# Patient Record
Sex: Male | Born: 1950 | Race: White | Hispanic: No | Marital: Married | Smoking: Former smoker
Health system: Southern US, Community
[De-identification: ages and names within clinical notes are randomized; demographics above are authoritative.]

## PROBLEM LIST (undated history)

## (undated) DIAGNOSIS — Z8489 Family history of other specified conditions: Secondary | ICD-10-CM

## (undated) DIAGNOSIS — J189 Pneumonia, unspecified organism: Secondary | ICD-10-CM

## (undated) DIAGNOSIS — M1712 Unilateral primary osteoarthritis, left knee: Secondary | ICD-10-CM

## (undated) DIAGNOSIS — M1990 Unspecified osteoarthritis, unspecified site: Secondary | ICD-10-CM

## (undated) DIAGNOSIS — G4730 Sleep apnea, unspecified: Secondary | ICD-10-CM

## (undated) DIAGNOSIS — I10 Essential (primary) hypertension: Secondary | ICD-10-CM

## (undated) DIAGNOSIS — K219 Gastro-esophageal reflux disease without esophagitis: Secondary | ICD-10-CM

## (undated) DIAGNOSIS — D751 Secondary polycythemia: Secondary | ICD-10-CM

## (undated) DIAGNOSIS — J45909 Unspecified asthma, uncomplicated: Secondary | ICD-10-CM

## (undated) DIAGNOSIS — R3915 Urgency of urination: Secondary | ICD-10-CM

## (undated) DIAGNOSIS — M1711 Unilateral primary osteoarthritis, right knee: Secondary | ICD-10-CM

## (undated) DIAGNOSIS — N529 Male erectile dysfunction, unspecified: Secondary | ICD-10-CM

## (undated) DIAGNOSIS — E785 Hyperlipidemia, unspecified: Secondary | ICD-10-CM

## (undated) DIAGNOSIS — R079 Chest pain, unspecified: Secondary | ICD-10-CM

## (undated) HISTORY — PX: COLONOSCOPY W/ POLYPECTOMY: SHX1380

## (undated) HISTORY — PX: SKIN GRAFT: SHX250

## (undated) HISTORY — PX: HERNIA REPAIR: SHX51

## (undated) HISTORY — PX: KNEE SURGERY: SHX244

## (undated) HISTORY — PX: OTHER SURGICAL HISTORY: SHX169

## (undated) HISTORY — PX: SKIN SPLIT GRAFT: SHX444

## (undated) HISTORY — PX: APPENDECTOMY: SHX54

---
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
## 1970-08-24 HISTORY — PX: SKIN GRAFT: SHX250

## 2004-07-14 ENCOUNTER — Other Ambulatory Visit: Payer: Self-pay

## 2004-07-28 ENCOUNTER — Ambulatory Visit: Payer: Self-pay | Admitting: Orthopaedic Surgery

## 2005-08-24 HISTORY — PX: COLONOSCOPY: SHX174

## 2005-08-29 ENCOUNTER — Emergency Department: Payer: Self-pay | Admitting: Emergency Medicine

## 2005-09-29 ENCOUNTER — Ambulatory Visit: Payer: Self-pay | Admitting: Family Medicine

## 2005-10-15 ENCOUNTER — Ambulatory Visit: Payer: Self-pay | Admitting: General Surgery

## 2008-12-06 ENCOUNTER — Ambulatory Visit: Payer: Self-pay | Admitting: Otolaryngology

## 2008-12-17 ENCOUNTER — Ambulatory Visit: Payer: Self-pay | Admitting: Otolaryngology

## 2010-01-03 ENCOUNTER — Observation Stay: Payer: Self-pay | Admitting: Student

## 2012-05-13 ENCOUNTER — Emergency Department: Payer: Self-pay | Admitting: Emergency Medicine

## 2012-05-13 LAB — CBC WITH DIFFERENTIAL/PLATELET
Basophil #: 0 10*3/uL (ref 0.0–0.1)
Basophil %: 1.1 %
Eosinophil %: 0.2 %
HCT: 47 % (ref 40.0–52.0)
Lymphocyte #: 1.2 10*3/uL (ref 1.0–3.6)
MCHC: 34.3 g/dL (ref 32.0–36.0)
MCV: 97 fL (ref 80–100)
Monocyte #: 0.4 x10 3/mm (ref 0.2–1.0)
Monocyte %: 11.1 %
Neutrophil %: 54.7 %
RDW: 12.7 % (ref 11.5–14.5)

## 2012-05-13 LAB — MONONUCLEOSIS SCREEN: Mono Test: NEGATIVE

## 2012-05-13 LAB — COMPREHENSIVE METABOLIC PANEL
Albumin: 3.8 g/dL (ref 3.4–5.0)
Alkaline Phosphatase: 63 U/L (ref 50–136)
Bilirubin,Total: 0.3 mg/dL (ref 0.2–1.0)
Chloride: 101 mmol/L (ref 98–107)
Co2: 28 mmol/L (ref 21–32)
Creatinine: 1.18 mg/dL (ref 0.60–1.30)
EGFR (African American): >60
Glucose: 107 mg/dL — ABNORMAL HIGH (ref 65–99)
Osmolality: 278 (ref 275–301)
SGOT(AST): 52 U/L — ABNORMAL HIGH (ref 15–37)
SGPT (ALT): 70 U/L (ref 12–78)
Sodium: 138 mmol/L (ref 136–145)
Total Protein: 6.9 g/dL (ref 6.4–8.2)

## 2012-05-13 LAB — TROPONIN I: Troponin-I: <0.02 ng/mL

## 2012-05-13 LAB — LIPASE, BLOOD: Lipase: 103 U/L (ref 73–393)

## 2013-10-11 ENCOUNTER — Encounter (HOSPITAL_COMMUNITY): Payer: Self-pay | Admitting: Emergency Medicine

## 2013-10-11 ENCOUNTER — Emergency Department (HOSPITAL_COMMUNITY)
Admission: EM | Admit: 2013-10-11 | Discharge: 2013-10-12 | Disposition: A | Discharge status: Home / Self Care | Payer: Managed Care, Other (non HMO) | Attending: Emergency Medicine | Admitting: Emergency Medicine

## 2013-10-11 ENCOUNTER — Emergency Department (HOSPITAL_COMMUNITY): Payer: Managed Care, Other (non HMO)

## 2013-10-11 DIAGNOSIS — Z87891 Personal history of nicotine dependence: Secondary | ICD-10-CM | POA: Insufficient documentation

## 2013-10-11 DIAGNOSIS — R0602 Shortness of breath: Secondary | ICD-10-CM | POA: Insufficient documentation

## 2013-10-11 DIAGNOSIS — R079 Chest pain, unspecified: Secondary | ICD-10-CM | POA: Insufficient documentation

## 2013-10-11 DIAGNOSIS — Z8739 Personal history of other diseases of the musculoskeletal system and connective tissue: Secondary | ICD-10-CM | POA: Insufficient documentation

## 2013-10-11 HISTORY — DX: Unspecified osteoarthritis, unspecified site: M19.90

## 2013-10-11 LAB — BASIC METABOLIC PANEL
BUN: 18 mg/dL (ref 6–23)
CALCIUM: 9.1 mg/dL (ref 8.4–10.5)
CO2: 24 mEq/L (ref 19–32)
CREATININE: 1.12 mg/dL (ref 0.50–1.35)
Chloride: 101 mEq/L (ref 96–112)
GFR, EST AFRICAN AMERICAN: 78 mL/min — AB (ref >90)
GFR, EST NON AFRICAN AMERICAN: 68 mL/min — AB (ref >90)
GLUCOSE: 106 mg/dL — AB (ref 70–99)
POTASSIUM: 4.5 meq/L (ref 3.7–5.3)
Sodium: 140 mEq/L (ref 137–147)

## 2013-10-11 LAB — CBC
HCT: 43.2 % (ref 39.0–52.0)
Hemoglobin: 15.8 g/dL (ref 13.0–17.0)
MCH: 34.9 pg — ABNORMAL HIGH (ref 26.0–34.0)
MCHC: 36.6 g/dL — AB (ref 30.0–36.0)
MCV: 95.4 fL (ref 78.0–100.0)
Platelets: 155 10*3/uL (ref 150–400)
RBC: 4.53 MIL/uL (ref 4.22–5.81)
RDW: 12.7 % (ref 11.5–15.5)
WBC: 8.3 10*3/uL (ref 4.0–10.5)

## 2013-10-11 LAB — POCT I-STAT TROPONIN I: Troponin i, poc: 0 ng/mL (ref 0.00–0.08)

## 2013-10-11 LAB — PRO B NATRIURETIC PEPTIDE: Pro B Natriuretic peptide (BNP): 30.9 pg/mL (ref 0–125)

## 2013-10-11 MED ORDER — NITROGLYCERIN 0.4 MG SL SUBL
0.4000 mg | SUBLINGUAL_TABLET | SUBLINGUAL | Status: DC | PRN
  Filled 2013-10-11: qty 25

## 2013-10-11 MED ORDER — ASPIRIN 81 MG PO CHEW
324.0000 mg | CHEWABLE_TABLET | Freq: Once | ORAL | Status: AC
  Administered 2013-10-11: 324 mg via ORAL
  Filled 2013-10-11: qty 4

## 2013-10-11 NOTE — ED Notes (Signed)
Pt states allergy to acromycin

## 2013-10-11 NOTE — ED Notes (Signed)
Pt ambulatory to restroom

## 2013-10-11 NOTE — ED Notes (Signed)
Pt states for the last few days has had left sided off and on chest pain. States he felt dizzy walking in to the room and feels a little nauseous. Pt denies SOB, states he sleeps with cipap at night. Pt states he took his bp at home and it was 212/105. Pt in NAD, AAOx4. C/o 2/10 left sided CP. States hes had a mild headache. Denies any vision changes.

## 2013-10-11 NOTE — ED Provider Notes (Signed)
CSN: [id number]     Arrival date & time 10/11/13  1930 History   First MD Initiated Contact with Patient 10/11/13 2037     Chief Complaint  Patient presents with  . Chest Pain     (Consider location/radiation/quality/duration/timing/severity/associated sxs/prior Treatment) Patient is a 63 y.o. male presenting with chest pain.  Chest Pain Pain location:  L chest Pain quality: pressure   Pain radiates to:  Does not radiate Pain severity:  Mild Onset quality:  Gradual Duration: several hours. Timing:  Constant Progression:  Resolved Chronicity:  New Context: at rest   Relieved by:  Nothing Worsened by:  Nothing tried Associated symptoms: shortness of breath   Associated symptoms: no abdominal pain, no cough, no diaphoresis, no dizziness, no fever, no nausea and not vomiting     Past Medical History  Diagnosis Date  . Arthritis    Past Surgical History  Procedure Laterality Date  . Hernia repair    . Appendectomy    . Hydrocelectomy    . Skin split graft    . Knee surgery     No family history on file. History  Substance Use Topics  . Smoking status: Former Research scientist (life sciences)  . Smokeless tobacco: Never Used  . Alcohol Use: No    Review of Systems  Constitutional: Negative for fever and diaphoresis.  HENT: Negative for congestion.   Respiratory: Positive for shortness of breath. Negative for cough.   Cardiovascular: Positive for chest pain.  Gastrointestinal: Negative for nausea, vomiting, abdominal pain and diarrhea.  Neurological: Negative for dizziness.  All other systems reviewed and are negative.      Allergies  Review of patient's allergies indicates no known allergies.  Home Medications   Current Outpatient Rx  Name  Route  Sig  Dispense  Refill  . albuterol (PROVENTIL HFA;VENTOLIN HFA) 108 (90 BASE) MCG/ACT inhaler   Inhalation   Inhale 2 puffs into the lungs every 6 (six) hours as needed for wheezing or shortness of breath.         . fluticasone  (FLOVENT HFA) 110 MCG/ACT inhaler   Inhalation   Inhale 2 puffs into the lungs 2 (two) times daily as needed (wheezing and shortness of breath).         . Multiple Vitamins-Minerals (MULTIVITAMIN WITH MINERALS) tablet   Oral   Take 1 tablet by mouth daily. Packet of varies vitamins          BP 154/70  Pulse 58  Temp(Src) 97.9 F (36.6 C) (Oral)  Resp 18  Ht 5\' 10"  (1.778 m)  Wt 250 lb (113.399 kg)  BMI 35.87 kg/m2  SpO2 98% Physical Exam  Nursing note and vitals reviewed. Constitutional: He is oriented to person, place, and time. He appears well-developed and well-nourished. No distress.  HENT:  Head: Normocephalic and atraumatic.  Mouth/Throat: Oropharynx is clear and moist.  Eyes: Conjunctivae are normal. Pupils are equal, round, and reactive to light. No scleral icterus.  Neck: Neck supple.  Cardiovascular: Normal rate, regular rhythm, normal heart sounds and intact distal pulses.   No murmur heard. Pulmonary/Chest: Effort normal and breath sounds normal. No stridor. No respiratory distress. He has no wheezes. He has no rales.  Abdominal: Soft. He exhibits no distension. There is no tenderness.  Musculoskeletal: Normal range of motion. He exhibits no edema.  Neurological: He is alert and oriented to person, place, and time.  Skin: Skin is warm and dry. No rash noted.  Psychiatric: He has a normal mood and  affect. His behavior is normal.    ED Course  Procedures (including critical care time) Labs Review Labs Reviewed  CBC - Abnormal; Notable for the following:    MCH 34.9 (*)    MCHC 36.6 (*)    All other components within normal limits  BASIC METABOLIC PANEL - Abnormal; Notable for the following:    Glucose, Bld 106 (*)    GFR calc non Af Amer 68 (*)    GFR calc Af Amer 78 (*)    All other components within normal limits  PRO B NATRIURETIC PEPTIDE  TROPONIN I  POCT I-STAT TROPONIN I   Imaging Review Dg Chest 2 View  10/11/2013   CLINICAL DATA:   Hypertension, left chest pain  EXAM: CHEST - 2 VIEW  COMPARISON:  None available  FINDINGS: Lungs are clear. Heart size and mediastinal contours are within normal limits. Elevation of right diaphragmatic leaflet versus diaphragmatic eventration. No effusion.  No pneumothorax. Visualized skeletal structures are unremarkable.  IMPRESSION: No acute cardiopulmonary disease.   Electronically Signed   By: Arne Cleveland M.D.   On: 10/11/2013 20:10  All radiology studies independently viewed by me.     EKG Interpretation   None     EKG - NSR, rate 62, leftward axis, normal intervals, no ST/T changes, no priors for comparison.  MDM   Final diagnoses:  Chest pain    63 yo male with chest pain.  He reports some mild chest "annoyance" intermittently for past few days, particularly when he laid down for sleep.  Then today, he began to have some lightheadedness.  He checked his BP and it was elevated.  During this time, he developed some chest pain and mild SOB.  No pain now.  His workup so far is reassuring.  No hx of HTN, DM, HLD, not a smoker.  No hx of CAD.  Negative stress two years ago.  Plan to check delta troponin, but have low suspicion for ACS.  History not consistent with Dissection or PE.    Delta troponin negative.  Plan dc home with cards follow up.  Return precautions given.    Houston Siren III, MD 10/12/13 [phone]

## 2013-10-11 NOTE — ED Notes (Signed)
Pt c/o high blood pressure, intermittent left sided CP x 3 days with SOB when laying down.

## 2013-10-11 NOTE — ED Notes (Signed)
Pt denies CP at this time 

## 2013-10-12 LAB — TROPONIN I: Troponin I: <0.3 ng/mL (ref <0.30)

## 2013-10-12 NOTE — Discharge Instructions (Signed)
Chest Pain (Nonspecific) °It is often hard to give a specific diagnosis for the cause of chest pain. There is always a chance that your pain could be related to something serious, such as a heart attack or a blood clot in the lungs. You need to follow up with your caregiver for further evaluation. °CAUSES  °· Heartburn. °· Pneumonia or bronchitis. °· Anxiety or stress. °· Inflammation around your heart (pericarditis) or lung (pleuritis or pleurisy). °· A blood clot in the lung. °· A collapsed lung (pneumothorax). It can develop suddenly on its own (spontaneous pneumothorax) or from injury (trauma) to the chest. °· Shingles infection (herpes zoster virus). °The chest wall is composed of bones, muscles, and cartilage. Any of these can be the source of the pain. °· The bones can be bruised by injury. °· The muscles or cartilage can be strained by coughing or overwork. °· The cartilage can be affected by inflammation and become sore (costochondritis). °DIAGNOSIS  °Lab tests or other studies, such as X-rays, electrocardiography, stress testing, or cardiac imaging, may be needed to find the cause of your pain.  °TREATMENT  °· Treatment depends on what may be causing your chest pain. Treatment may include: °· Acid blockers for heartburn. °· Anti-inflammatory medicine. °· Pain medicine for inflammatory conditions. °· Antibiotics if an infection is present. °· You may be advised to change lifestyle habits. This includes stopping smoking and avoiding alcohol, caffeine, and chocolate. °· You may be advised to keep your head raised (elevated) when sleeping. This reduces the chance of acid going backward from your stomach into your esophagus. °· Most of the time, nonspecific chest pain will improve within 2 to 3 days with rest and mild pain medicine. °HOME CARE INSTRUCTIONS  °· If antibiotics were prescribed, take your antibiotics as directed. Finish them even if you start to feel better. °· For the next few days, avoid physical  activities that bring on chest pain. Continue physical activities as directed. °· Do not smoke. °· Avoid drinking alcohol. °· Only take over-the-counter or prescription medicine for pain, discomfort, or fever as directed by your caregiver. °· Follow your caregiver's suggestions for further testing if your chest pain does not go away. °· Keep any follow-up appointments you made. If you do not go to an appointment, you could develop lasting (chronic) problems with pain. If there is any problem keeping an appointment, you must call to reschedule. °SEEK MEDICAL CARE IF:  °· You think you are having problems from the medicine you are taking. Read your medicine instructions carefully. °· Your chest pain does not go away, even after treatment. °· You develop a rash with blisters on your chest. °SEEK IMMEDIATE MEDICAL CARE IF:  °· You have increased chest pain or pain that spreads to your arm, neck, jaw, back, or abdomen. °· You develop shortness of breath, an increasing cough, or you are coughing up blood. °· You have severe back or abdominal pain, feel nauseous, or vomit. °· You develop severe weakness, fainting, or chills. °· You have a fever. °THIS IS AN EMERGENCY. Do not wait to see if the pain will go away. Get medical help at once. Call your local emergency services (911 in [place] Do not drive yourself to the hospital. °MAKE SURE YOU:  °· Understand these instructions. °· Will watch your condition. °· Will get help right away if you are not doing well or get worse. °Document Released: 05/20/2005 Document Revised: 11/02/2011 Document Reviewed: 03/15/2008 °Exit[employer]® Patient Information ©2014 [employer]   [employer]. ° °

## 2013-10-18 LAB — LIPID PANEL
Cholesterol: 250 mg/dL — AB (ref 0–200)
HDL: 38 mg/dL (ref 35–70)
LDL CALC: 160 mg/dL
LDL/HDL RATIO: 4.2
TRIGLYCERIDES: 259 mg/dL — AB (ref 40–160)

## 2013-10-18 LAB — BASIC METABOLIC PANEL
BUN: 17 mg/dL (ref 4–21)
Creatinine: 1 mg/dL (ref 0.6–1.3)
GLUCOSE: 101 mg/dL
SODIUM: 140 mmol/L (ref 137–147)

## 2013-10-18 LAB — HEPATIC FUNCTION PANEL
ALK PHOS: 52 U/L (ref 25–125)
ALT: 31 U/L (ref 10–40)
AST: 26 U/L (ref 14–40)
Bilirubin, Total: 0.5 mg/dL

## 2013-10-18 LAB — CBC AND DIFFERENTIAL: WBC: 7.3 10^3/mL

## 2013-10-18 LAB — PSA: PSA: 0.6

## 2013-12-05 ENCOUNTER — Ambulatory Visit (INDEPENDENT_AMBULATORY_CARE_PROVIDER_SITE_OTHER): Payer: Managed Care, Other (non HMO) | Admitting: Interventional Cardiology

## 2013-12-05 ENCOUNTER — Encounter: Payer: Self-pay | Admitting: Interventional Cardiology

## 2013-12-05 VITALS — BP 150/80 | HR 64 | Ht 70.0 in | Wt 248.0 lb

## 2013-12-05 DIAGNOSIS — G4730 Sleep apnea, unspecified: Secondary | ICD-10-CM | POA: Insufficient documentation

## 2013-12-05 DIAGNOSIS — J45909 Unspecified asthma, uncomplicated: Secondary | ICD-10-CM

## 2013-12-05 DIAGNOSIS — R079 Chest pain, unspecified: Secondary | ICD-10-CM

## 2013-12-05 DIAGNOSIS — K219 Gastro-esophageal reflux disease without esophagitis: Secondary | ICD-10-CM

## 2013-12-05 HISTORY — DX: Morbid (severe) obesity due to excess calories: E66.01

## 2013-12-05 HISTORY — DX: Chest pain, unspecified: R07.9

## 2013-12-05 NOTE — Patient Instructions (Signed)
Your physician recommends that you continue on your current medications as directed. Please refer to the Current Medication list given to you today.  Your physician has requested that you have a lexiscan myoview. For further information please visit [email or web address]. Please follow instruction sheet, as given.  Your physician recommends that you schedule a follow-up appointment pending results

## 2013-12-05 NOTE — Progress Notes (Signed)
Patient ID: Kenneth Hester, male   DOB: 17-May-1951, 63 y.o.   MRN: [id number]   Date: 12/05/2013 ID: Kenneth Hester, DOB October 31, 1950, MRN [id number] PCP: No PCP Per Patient  Reason: Chest discomfort  ASSESSMENT;  1. Chest discomfort 2. Obesity 3. Obstructive sleep apnea 4. History of asthma requiring minimal if any therapy for several years 5. History of inability to achieve target heart rate on exercise treadmill tests in the past, 2 prior occasions 6. History of reflux   PLAN:  1. Pharmacologic myocardial perfusion study 2. Baby aspirin 81 mg per day 3. Weight loss and aerobic exercise if myocardial perfusion study is unremarkable   SUBJECTIVE: Kenneth Hester is a 63 y.o. male who is the son of  1 of my coronary artery disease patient's, Mr. Kenneth Hester. He [facility] evaluation in [facility] in February 2015 because of precordial chest pressure. EKG performed suggested "possible septal infarct". It troponin levels were obtained and were normal. His story is that of intermittent episodes of substernal discomfort that of poorly characterized. On the day of presentation to the emergency room he described left chest pressure, gradual onset, constantly present for several hours. The discomfort resolved while in the emergency room. There was associated shortness of breath and mild diaphoresis.  He also complains of frequent heartburn characterized as a burning sensation in the left parasternal area. He has had various episodes of chest discomfort associated with cardiac evaluation including 2 prior stress test. The most recent study was about 4 years ago, but he tells me he could not get his heart rate to target, and the study had to be converted to a nuclear pharmacologic perfusion study. 2 prior exercise treadmill test were also nondiagnostic because of poor heart rate response. His episodes of chest discomfort are generally not exertion related.   No Known  Allergies  Current Outpatient Prescriptions on File Prior to Visit  Medication Sig Dispense Refill  . albuterol (PROVENTIL HFA;VENTOLIN HFA) 108 (90 BASE) MCG/ACT inhaler Inhale 2 puffs into the lungs every 6 (six) hours as needed for wheezing or shortness of breath.      . fluticasone (FLOVENT HFA) 110 MCG/ACT inhaler Inhale 2 puffs into the lungs 2 (two) times daily as needed (wheezing and shortness of breath).      . Multiple Vitamins-Minerals (MULTIVITAMIN WITH MINERALS) tablet Take 1 tablet by mouth daily. Packet of varies vitamins       No current facility-administered medications on file prior to visit.    Past Medical History  Diagnosis Date  . Arthritis     Past Surgical History  Procedure Laterality Date  . Hernia repair    . Appendectomy    . Hydrocelectomy    . Skin split graft    . Knee surgery      History   Social History  . Marital Status: Married    Spouse Name: N/A    Number of Children: N/A  . Years of Education: N/A   Occupational History  . Not on file.   Social History Main Topics  . Smoking status: Former Research scientist (life sciences)  . Smokeless tobacco: Never Used  . Alcohol Use: No  . Drug Use: No  . Sexual Activity: Yes   Other Topics Concern  . Not on file   Social History Narrative  . No narrative on file    No family history on file.  ROS: Severe bilateral osteoarthritis both knees preventing significant ambulation. Recurrent heartburn. Intolerance to statin therapy. Sleep apnea  on CPAP. Denies lower extremity edema. No orthopnea or PND.Marland Kitchen Other systems negative for complaints.  OBJECTIVE: BP 150/80  Pulse 64  Ht 5\' 10"  (1.778 m)  Wt 248 lb (112.492 kg)  BMI 35.58 kg/m2,  General: No acute distress, unremarkable  HEENT: normal no jaundice or pallor  Neck: JVD difficult to evaluate due to neck morphology. Carotids 2+ bilateral without bruits  Chest: Clear  Cardiac: Murmur: Absent. Gallop: S4. Rhythm:  regular. Other: Normal  Abdomen: Bruit:  Absent. Pulsation: Absent Extremities: Edema: Absent. Pulses: 2+ bilateral upper and lower extremities  Neuro: Normal  Psych: Normal  ECG: Sinus bradycardia with early QRS transition but otherwise unremarkable with exception of ST abnormality in limb lead 1, 2, and 3.

## 2013-12-20 ENCOUNTER — Ambulatory Visit (HOSPITAL_COMMUNITY): Payer: Managed Care, Other (non HMO) | Attending: Cardiovascular Disease | Admitting: Radiology

## 2013-12-20 VITALS — BP 140/84 | Ht 70.0 in | Wt 245.0 lb

## 2013-12-20 DIAGNOSIS — R0602 Shortness of breath: Secondary | ICD-10-CM

## 2013-12-20 DIAGNOSIS — R079 Chest pain, unspecified: Secondary | ICD-10-CM | POA: Insufficient documentation

## 2013-12-20 MED ORDER — TECHNETIUM TC 99M SESTAMIBI GENERIC - CARDIOLITE
30.0000 | Freq: Once | INTRAVENOUS | Status: AC | PRN
  Administered 2013-12-20: 30 via INTRAVENOUS

## 2013-12-20 MED ORDER — TECHNETIUM TC 99M SESTAMIBI GENERIC - CARDIOLITE
10.0000 | Freq: Once | INTRAVENOUS | Status: AC | PRN
  Administered 2013-12-20: 10 via INTRAVENOUS

## 2013-12-20 MED ORDER — REGADENOSON 0.4 MG/5ML IV SOLN
0.4000 mg | Freq: Once | INTRAVENOUS | Status: AC
  Administered 2013-12-20: 0.4 mg via INTRAVENOUS

## 2013-12-20 NOTE — Progress Notes (Signed)
[facility] [address] [place], [place] [address] [phone]    Cardiology Nuclear Med Study  Kenneth Hester is a 63 y.o. male     MRN : [id number]     DOB: 08/14/1951  Procedure Date: 12/20/2013  Nuclear Med Background Indication for Stress Test:  Evaluation for Ischemia History: No Known History of CAD;Previous  Nuclear Study (28yrs ago [facility]) Nml per pt; Asthma Cardiac Risk Factors: Strong Premature Family History - CAD, History of Smoking, Hypertension and Lipids  Symptoms:  Chest Pain, Dizziness and DOE   Nuclear Pre-Procedure Caffeine/Decaff Intake:  None > 12 hrs NPO After: 8:00pm   Lungs:  clear O2 Sat: 95% on room air. IV 0.9% NS with Angio Cath:  22g  IV Site: R Hand x 1, tolerated well IV Started by:  Irven Baltimore, RN  Chest Size (in):  50 Cup Size: n/a  Height: 5\' 10"  (1.778 m)  Weight:  245 lb (111.131 kg)  BMI:  Body mass index is 35.15 kg/(m^2). Tech Comments:  No medications this am per patient. Irven Baltimore, RN. Used rescue inhaler prior to test (albuterol)    Nuclear Med Study 1 or 2 day study: 1 day  Stress Test Type:  Treadmill/Lexiscan  Reading MD: N/A  Order Authorizing Provider:  Daneen Schick, III  Resting Radionuclide: Technetium 34m Sestamibi  Resting Radionuclide Dose: 11.0 mCi   Stress Radionuclide:  Technetium 51m Sestamibi  Stress Radionuclide Dose: 33.0 mCi           Stress Protocol Rest HR: 67 Stress HR: 93  Rest BP: 140/84 Stress BP: 162/79  Exercise Time (min): 2:00 METS: n/a   Predicted Max HR: 156 bpm % Max HR: 59.62 bpm Rate Pressure Product: 15066   Dose of Adenosine (mg):  n/a Dose of Lexiscan: 0.4 mg  Dose of Atropine (mg): n/a Dose of Dobutamine: n/a mcg/kg/min (at max HR)  Stress Test Technologist: Ileene Hutchinson, EMT-P  Nuclear Technologist:  Charlton Amor, CNMT     Rest Procedure:  Myocardial perfusion imaging was performed at rest 45 minutes following the intravenous  administration of Technetium 69m Sestamibi. Rest ECG: NSR - Normal EKG  Stress Procedure:  The patient received IV Lexiscan 0.4 mg over 15-seconds with concurrent low level exercise and then Technetium 82m Sestamibi was injected at 30-seconds while the patient continued walking one more minute.  Quantitative spect images were obtained after a 45-minute delay. Stress ECG: No significant change from baseline ECG  QPS Raw Data Images:  Normal; no motion artifact; normal heart/lung ratio. Stress Images:  Normal homogeneous uptake in all areas of the myocardium. Rest Images:  Normal homogeneous uptake in all areas of the myocardium. Subtraction (SDS):  No evidence of ischemia. Transient Ischemic Dilatation (Normal <1.22):  1.02 Lung/Heart Ratio (Normal <0.45):  0.43  Quantitative Gated Spect Images QGS EDV:  125 ml QGS ESV:  36 ml  Impression Exercise Capacity:  Lexiscan with no exercise. BP Response:  Normal blood pressure response. Clinical Symptoms:  No significant symptoms noted. ECG Impression:  No significant ST segment change suggestive of ischemia. Comparison with Prior Nuclear Study: No previous nuclear study performed  Overall Impression:  Normal stress nuclear study.  LV Ejection Fraction: 71%.  LV Wall Motion:  NL LV Function; NL Wall Motion  Sanda Klein, MD, [facility] [phone] office [phone] pager

## 2013-12-21 ENCOUNTER — Telehealth: Payer: Self-pay

## 2013-12-21 NOTE — Telephone Encounter (Signed)
Message copied by Lamar Laundry on Thu Dec 21, 2013  4:42 PM ------      Message from: Daneen Schick      Created: Thu Dec 21, 2013  4:31 PM       Stress test is normal, therefore no blockage in any major vessel greater than 70%.       Heart is strong. ------

## 2013-12-21 NOTE — Telephone Encounter (Signed)
pt aware of nuclear study.Stress test is normal, therefore no blockage in any major vessel greater than 70%.       Heart is strong. pt verbalized understanding.

## 2015-04-24 ENCOUNTER — Other Ambulatory Visit: Payer: Self-pay | Admitting: Orthopedic Surgery

## 2015-04-24 ENCOUNTER — Ambulatory Visit
Admission: RE | Admit: 2015-04-24 | Discharge: 2015-04-24 | Disposition: A | Discharge status: Home / Self Care | Payer: PPO | Source: Ambulatory Visit | Attending: Orthopedic Surgery | Admitting: Orthopedic Surgery

## 2015-04-24 DIAGNOSIS — M170 Bilateral primary osteoarthritis of knee: Secondary | ICD-10-CM | POA: Diagnosis not present

## 2015-04-24 DIAGNOSIS — M25561 Pain in right knee: Secondary | ICD-10-CM

## 2015-04-24 DIAGNOSIS — M1120 Other chondrocalcinosis, unspecified site: Secondary | ICD-10-CM | POA: Diagnosis not present

## 2015-04-24 DIAGNOSIS — M25562 Pain in left knee: Principal | ICD-10-CM

## 2015-07-13 ENCOUNTER — Other Ambulatory Visit: Payer: Self-pay | Admitting: Family Medicine

## 2015-07-17 ENCOUNTER — Other Ambulatory Visit: Payer: Self-pay | Admitting: Family Medicine

## 2015-07-17 ENCOUNTER — Other Ambulatory Visit: Payer: Self-pay

## 2015-07-17 MED ORDER — SILDENAFIL CITRATE 100 MG PO TABS: 100.0000 mg | ORAL_TABLET | Freq: Every day | ORAL | Status: DC

## 2015-07-19 ENCOUNTER — Other Ambulatory Visit: Payer: Self-pay | Admitting: Family Medicine

## 2015-08-06 ENCOUNTER — Encounter: Payer: Self-pay | Admitting: *Deleted

## 2015-08-13 ENCOUNTER — Other Ambulatory Visit: Payer: Self-pay | Admitting: Family Medicine

## 2015-08-19 ENCOUNTER — Other Ambulatory Visit: Payer: Self-pay | Admitting: Family Medicine

## 2015-08-28 DIAGNOSIS — M1711 Unilateral primary osteoarthritis, right knee: Secondary | ICD-10-CM | POA: Diagnosis not present

## 2015-09-04 ENCOUNTER — Encounter: Payer: Self-pay | Admitting: Family Medicine

## 2015-09-04 ENCOUNTER — Other Ambulatory Visit: Payer: Self-pay | Admitting: Family Medicine

## 2015-09-04 ENCOUNTER — Ambulatory Visit (INDEPENDENT_AMBULATORY_CARE_PROVIDER_SITE_OTHER): Payer: PPO | Admitting: Family Medicine

## 2015-09-04 VITALS — BP 154/82 | HR 72 | Temp 97.5°F | Resp 14 | Wt 256.0 lb

## 2015-09-04 DIAGNOSIS — Z01818 Encounter for other preprocedural examination: Secondary | ICD-10-CM

## 2015-09-04 DIAGNOSIS — M25561 Pain in right knee: Secondary | ICD-10-CM

## 2015-09-04 NOTE — Progress Notes (Signed)
Patient ID: Kenneth Hester, male   DOB: 05-May-1951, 65 y.o.   MRN: [id number]    Subjective:  HPI  Patient is here for surgical clearance. He is having Right total knee replacement scheduled for February 13th with Dr. Noemi Chapel.  Prior to Admission medications   Medication Sig Start Date End Date Taking? Authorizing Provider  FLOVENT HFA 110 MCG/ACT inhaler INHALE 2 PUFFS INTO THE LUNGS TWICE A DAY 08/14/15  Yes Richard Maceo Pro., MD  hydrochlorothiazide (HYDRODIURIL) 25 MG tablet Take by mouth. 07/27/14  Yes Historical Provider, MD  lisinopril (PRINIVIL,ZESTRIL) 20 MG tablet 1 tab daily 10/18/13  Yes Historical Provider, MD  Multiple Vitamins-Minerals (MULTIVITAMIN WITH MINERALS) tablet Take 1 tablet by mouth daily. Packet of varies vitamins   Yes Historical Provider, MD  omeprazole (PRILOSEC) 20 MG capsule Take by mouth. 07/27/14  Yes Historical Provider, MD  PROAIR HFA 108 (90 BASE) MCG/ACT inhaler INHALE 2 SPRAYS EVERY 4 HOURS AS NEEDED 08/20/15  Yes Richard Maceo Pro., MD  VIAGRA 100 MG tablet TAKE 1 TABLET BY MOUTH EVERY DAY 07/22/15  Yes Jerrol Banana., MD    Patient Active Problem List   Diagnosis Date Noted  . Chest pain 12/05/2013  . Sleep apnea 12/05/2013  . Morbid obesity ([place]) 12/05/2013  . GERD (gastroesophageal reflux disease) 12/05/2013  . Asthma 12/05/2013    Past Medical History  Diagnosis Date  . Arthritis     Social History   Social History  . Marital Status: Married    Spouse Name: N/A  . Number of Children: N/A  . Years of Education: N/A   Occupational History  . Not on file.   Social History Main Topics  . Smoking status: Former Research scientist (life sciences)  . Smokeless tobacco: Never Used  . Alcohol Use: No  . Drug Use: No  . Sexual Activity: Yes   Other Topics Concern  . Not on file   Social History Narrative    Allergies  Allergen Reactions  . Lisinopril     cough, voice was affected and had hard time singing  . Achromycin [Tetracycline]  Rash    Review of Systems  Constitutional: Negative.   Respiratory: Negative.   Cardiovascular: Negative.   Gastrointestinal: Negative.   Musculoskeletal: Positive for joint pain.    Immunization History  Administered Date(s) Administered  . Pneumococcal Conjugate-13 07/27/2014  . Tdap 07/27/2014   Objective:  BP 154/82 mmHg  Pulse 72  Temp(Src) 97.5 F (36.4 C)  Resp 14  Wt 256 lb (116.121 kg)  Physical Exam  Constitutional: He is oriented to person, place, and time and well-developed, well-nourished, and in no distress.  HENT:  Head: Normocephalic and atraumatic.  Eyes: Conjunctivae are normal. Pupils are equal, round, and reactive to light.  Neck: Normal range of motion. Neck supple.  Cardiovascular: Normal rate, regular rhythm, normal heart sounds and intact distal pulses.   No murmur heard. Pulmonary/Chest: Effort normal and breath sounds normal. No respiratory distress. He has no wheezes.  Musculoskeletal: He exhibits no edema.  Neurological: He is alert and oriented to person, place, and time.  Psychiatric: Mood, memory, affect and judgment normal.    Lab Results  Component Value Date   WBC 7.3 10/18/2013   HGB 15.8 10/11/2013   HCT 43.2 10/11/2013   PLT 155 10/11/2013   GLUCOSE 106* 10/11/2013   CHOL 250* 10/18/2013   TRIG 259* 10/18/2013   HDL 38 10/18/2013   LDLCALC 160 10/18/2013   PSA 0.6 10/18/2013  CMP     Component Value Date/Time   NA 140 10/18/2013   NA 140 10/11/2013 1940   NA 138 05/13/2012 1617   K 4.5 10/11/2013 1940   K 4.3 05/13/2012 1617   CL 101 10/11/2013 1940   CL 101 05/13/2012 1617   CO2 24 10/11/2013 1940   CO2 28 05/13/2012 1617   GLUCOSE 106* 10/11/2013 1940   GLUCOSE 107* 05/13/2012 1617   BUN 17 10/18/2013   BUN 18 10/11/2013 1940   BUN 17 05/13/2012 1617   CREATININE 1.0 10/18/2013   CREATININE 1.12 10/11/2013 1940   CREATININE 1.18 05/13/2012 1617   CALCIUM 9.1 10/11/2013 1940   CALCIUM 8.6 05/13/2012 1617     PROT 6.9 05/13/2012 1617   ALBUMIN 3.8 05/13/2012 1617   AST 26 10/18/2013   AST 52* 05/13/2012 1617   ALT 31 10/18/2013   ALT 70 05/13/2012 1617   ALKPHOS 52 10/18/2013   ALKPHOS 63 05/13/2012 1617   BILITOT 0.3 05/13/2012 1617   GFRNONAA 68* 10/11/2013 1940   GFRNONAA >60 05/13/2012 1617   GFRAA 78* 10/11/2013 1940   GFRAA >60 05/13/2012 1617    Assessment and Plan :  1. Preoperative clearance EKG stable today. Patient is cleared for surgery. Form faxed over to Dr. Noemi Chapel. - EKG 12-Lead Patient medically cleared for TKR. 2. Right knee pain/Endstage OA  I have done the exam and reviewed the above chart and it is accurate to the best of my knowledge.   Miguel Aschoff MD [facility] 09/04/2015 11:17 AM

## 2015-09-05 DIAGNOSIS — G4733 Obstructive sleep apnea (adult) (pediatric): Secondary | ICD-10-CM | POA: Diagnosis not present

## 2015-09-13 ENCOUNTER — Other Ambulatory Visit: Payer: Self-pay | Admitting: Family Medicine

## 2015-09-13 ENCOUNTER — Other Ambulatory Visit: Payer: Self-pay

## 2015-09-13 MED ORDER — HYDROCHLOROTHIAZIDE 25 MG PO TABS: 25.0000 mg | ORAL_TABLET | Freq: Every day | ORAL | Status: DC

## 2015-09-14 ENCOUNTER — Other Ambulatory Visit: Payer: Self-pay | Admitting: Family Medicine

## 2015-09-25 ENCOUNTER — Encounter (HOSPITAL_COMMUNITY): Payer: Self-pay | Admitting: Physician Assistant

## 2015-09-25 ENCOUNTER — Encounter (HOSPITAL_COMMUNITY): Admission: RE | Admit: 2015-09-25 | Payer: PPO | Source: Ambulatory Visit

## 2015-09-25 DIAGNOSIS — M1711 Unilateral primary osteoarthritis, right knee: Secondary | ICD-10-CM | POA: Diagnosis present

## 2015-09-25 DIAGNOSIS — I10 Essential (primary) hypertension: Secondary | ICD-10-CM | POA: Diagnosis present

## 2015-09-25 NOTE — H&P (Signed)
TOTAL KNEE ADMISSION H&P  Patient is being admitted for right total knee arthroplasty.  Subjective:  Chief Complaint:right knee pain.  HPI: Kenneth Hester, 65 y.o. male, has a history of pain and functional disability in the right knee due to arthritis and has failed non-surgical conservative treatments for greater than 12 weeks to includeNSAID's and/or analgesics, corticosteriod injections, viscosupplementation injections, flexibility and strengthening excercises, supervised PT with diminished ADL's post treatment, weight reduction as appropriate and activity modification.  Onset of symptoms was gradual, starting 10 years ago with gradually worsening course since that time. The patient noted prior procedures on the knee to include  arthroscopy and menisectomy on the right knee(s).  Patient currently rates pain in the right knee(s) at 10 out of 10 with activity. Patient has night pain, worsening of pain with activity and weight bearing, pain that interferes with activities of daily living, crepitus and joint swelling.  Patient has evidence of subchondral sclerosis, periarticular osteophytes and joint space narrowing by imaging studies. There is no active infection.  Patient Active Problem List   Diagnosis Date Noted  . Chest pain 12/05/2013  . Sleep apnea 12/05/2013  . Morbid obesity ([place]) 12/05/2013  . GERD (gastroesophageal reflux disease) 12/05/2013  . Asthma 12/05/2013   Past Medical History  Diagnosis Date  . Arthritis   . Primary localized osteoarthritis of right knee   . Hypertension   . Asthma     Past Surgical History  Procedure Laterality Date  . Hernia repair    . Appendectomy    . Hydrocelectomy    . Skin split graft    . Knee surgery Right   . Skin graft      No current facility-administered medications for this encounter.  Current outpatient prescriptions:  .  FLOVENT HFA 110 MCG/ACT inhaler, INHALE 2 PUFFS INTO THE LUNGS TWICE A DAY, Disp: 12 Inhaler, Rfl: 0 .   hydrochlorothiazide (HYDRODIURIL) 25 MG tablet, Take 1 tablet (25 mg total) by mouth daily., Disp: 90 tablet, Rfl: 3 .  Multiple Vitamins-Minerals (MULTIVITAMIN WITH MINERALS) tablet, Take 1 tablet by mouth daily. Packet of varies vitamins, Disp: , Rfl:  .  PROAIR HFA 108 (90 BASE) MCG/ACT inhaler, INHALE 2 SPRAYS EVERY 4 HOURS AS NEEDED, Disp: 8.5 Inhaler, Rfl: 12 .  VIAGRA 100 MG tablet, TAKE 1 TABLET BY MOUTH EVERY DAY, Disp: 10 tablet, Rfl: 12    Allergies  Allergen Reactions  . Lisinopril     cough, voice was affected and had hard time singing  . Achromycin [Tetracycline] Rash    Social History  Substance Use Topics  . Smoking status: Former Research scientist (life sciences)  . Smokeless tobacco: Never Used  . Alcohol Use: No    Family History  Problem Relation Age of Onset  . Heart attack Mother   . Heart failure Mother   . Diabetes Mother   . Hypertension Mother   . Heart disease Mother   . Heart attack Sister   . Heart disease Sister   . Diabetes Sister   . Heart disease Father   . Cancer Father   . Diabetes Son   . Heart attack Maternal Grandfather   . Heart disease Son      Review of Systems  Constitutional: Negative.   HENT: Negative.   Eyes: Negative.   Respiratory: Negative.   Cardiovascular: Negative.   Gastrointestinal: Negative.   Genitourinary: Negative.   Musculoskeletal: Positive for back pain and joint pain.  Skin: Negative.   Neurological: Negative.  Endo/Heme/Allergies: Negative.   Psychiatric/Behavioral: Negative.     Objective:  Physical Exam  Constitutional: He is oriented to person, place, and time. He appears well-developed and well-nourished.  HENT:  Head: Normocephalic and atraumatic.  Mouth/Throat: Oropharynx is clear and moist.  Eyes: Conjunctivae and EOM are normal. Pupils are equal, round, and reactive to light.  Neck: Neck supple.  Cardiovascular: Normal rate and regular rhythm.   Respiratory: Breath sounds normal.  GI: Soft. Bowel sounds are  normal.  Genitourinary:  Not pertinent to current symptomatology therefore not examined.  Musculoskeletal:  Examination of his right knee reveals pain medially and laterally.  1+ crepitation.  1+ synovitis.  Range of motion is from -5 to 125 degrees.  Knee is stable with diffuse pain and normal patella tracking.  Examination of his left knee reveals full range of motion without pain, swelling, weakness or instability. Vascular exam: Pulses are 2+ and symmetric.   Neurological: He is alert and oriented to person, place, and time.  Skin: Skin is warm and dry.  Psychiatric: He has a normal mood and affect. His behavior is normal.    Vital signs in last 24 hours:    Labs:   Estimated body mass index is 37.26 kg/(m^2) as calculated from the following:   Height as of 07/27/14: 5\' 8"  (1.727 m).   Weight as of 12/20/13: 111.131 kg (245 lb).   Imaging Review Plain radiographs demonstrate severe degenerative joint disease of the right knee(s). The overall alignment issignificant varus. The bone quality appears to be good for age and reported activity level.  Assessment/Plan:  End stage arthritis, right knee  Principal Problem:   Primary localized osteoarthritis of right knee Active Problems:   Sleep apnea   Morbid obesity (HCC)   GERD (gastroesophageal reflux disease)   Asthma   Hypertension  The patient history, physical examination, clinical judgment of the provider and imaging studies are consistent with end stage degenerative joint disease of the right knee(s) and total knee arthroplasty is deemed medically necessary. The treatment options including medical management, injection therapy arthroscopy and arthroplasty were discussed at length. The risks and benefits of total knee arthroplasty were presented and reviewed. The risks due to aseptic loosening, infection, stiffness, patella tracking problems, thromboembolic complications and other imponderables were discussed. The patient  acknowledged the explanation, agreed to proceed with the plan and consent was signed. Patient is being admitted for inpatient treatment for surgery, pain control, PT, OT, prophylactic antibiotics, VTE prophylaxis, progressive ambulation and ADL's and discharge planning. The patient is planning to be discharged home with home health services

## 2015-09-26 NOTE — Pre-Procedure Instructions (Signed)
Kenneth Hester  09/26/2015     Your procedure is scheduled on : Monday October 07, 2015 at 7:15 AM.  Report to [facility] Admitting at 5:30 AM.  Call this number if you have problems the morning of surgery: [phone]    Remember:  Do not eat food or drink liquids after midnight.  Take these medicines the morning of surgery with A SIP OF WATER : Flovent inhaler, Proair inhaler if needed   Stop taking any vitamins,herbal medications/supplements, NSAIDs, Ibuprofen, Advil, Motrin, Aleve, etc on Monday February 6th (1 week prior to surgery)   Bring CPAP mask the day of your surgery   Do not wear jewelry.  Do not wear lotions, powders, or cologne.    Men may shave face and neck.  Do not bring valuables to the hospital.  [facility] is not responsible for any belongings or valuables.  Contacts, dentures or bridgework may not be worn into surgery.  Leave your suitcase in the car.  After surgery it may be brought to your room.  For patients admitted to the hospital, discharge time will be determined by your treatment team.  Patients discharged the day of surgery will not be allowed to drive home.   Name and phone number of your driver:    Special instructions:  Shower using CHG soap the night before and the morning of your surgery  Please read over the following fact sheets that you were given. Pain Booklet, Coughing and Deep Breathing, Blood Transfusion Information, Total Joint Packet, MRSA Information and Surgical Site Infection Prevention

## 2015-09-27 ENCOUNTER — Encounter (HOSPITAL_COMMUNITY): Payer: Self-pay

## 2015-09-27 ENCOUNTER — Encounter (HOSPITAL_COMMUNITY)
Admission: RE | Admit: 2015-09-27 | Discharge: 2015-09-27 | Disposition: A | Discharge status: Home / Self Care | Payer: PPO | Source: Ambulatory Visit | Attending: Orthopedic Surgery | Admitting: Orthopedic Surgery

## 2015-09-27 DIAGNOSIS — M1711 Unilateral primary osteoarthritis, right knee: Secondary | ICD-10-CM | POA: Insufficient documentation

## 2015-09-27 DIAGNOSIS — Z0183 Encounter for blood typing: Secondary | ICD-10-CM | POA: Insufficient documentation

## 2015-09-27 DIAGNOSIS — Z01812 Encounter for preprocedural laboratory examination: Secondary | ICD-10-CM | POA: Diagnosis not present

## 2015-09-27 HISTORY — DX: Pneumonia, unspecified organism: J18.9

## 2015-09-27 HISTORY — DX: Gastro-esophageal reflux disease without esophagitis: K21.9

## 2015-09-27 HISTORY — DX: Urgency of urination: R39.15

## 2015-09-27 HISTORY — DX: Family history of other specified conditions: Z84.89

## 2015-09-27 LAB — COMPREHENSIVE METABOLIC PANEL
ALBUMIN: 3.7 g/dL (ref 3.5–5.0)
ALK PHOS: 44 U/L (ref 38–126)
ALT: 21 U/L (ref 17–63)
ANION GAP: 9 (ref 5–15)
AST: 24 U/L (ref 15–41)
BUN: 17 mg/dL (ref 6–20)
CALCIUM: 9.1 mg/dL (ref 8.9–10.3)
CHLORIDE: 107 mmol/L (ref 101–111)
CO2: 23 mmol/L (ref 22–32)
Creatinine, Ser: 1.12 mg/dL (ref 0.61–1.24)
GFR calc non Af Amer: >60 mL/min (ref >60)
GLUCOSE: 103 mg/dL — AB (ref 65–99)
Potassium: 4.2 mmol/L (ref 3.5–5.1)
SODIUM: 139 mmol/L (ref 135–145)
Total Bilirubin: 0.7 mg/dL (ref 0.3–1.2)
Total Protein: 6.2 g/dL — ABNORMAL LOW (ref 6.5–8.1)

## 2015-09-27 LAB — TYPE AND SCREEN
ABO/RH(D): A POS
Antibody Screen: NEGATIVE

## 2015-09-27 LAB — CBC WITH DIFFERENTIAL/PLATELET
BASOS ABS: 0 10*3/uL (ref 0.0–0.1)
BASOS PCT: 1 %
EOS ABS: 0.3 10*3/uL (ref 0.0–0.7)
EOS PCT: 4 %
HCT: 44.3 % (ref 39.0–52.0)
HEMOGLOBIN: 15.4 g/dL (ref 13.0–17.0)
Lymphocytes Relative: 38 %
Lymphs Abs: 2.9 10*3/uL (ref 0.7–4.0)
MCH: 33.4 pg (ref 26.0–34.0)
MCHC: 34.8 g/dL (ref 30.0–36.0)
MCV: 96.1 fL (ref 78.0–100.0)
Monocytes Absolute: 0.5 10*3/uL (ref 0.1–1.0)
Monocytes Relative: 7 %
NEUTROS PCT: 50 %
Neutro Abs: 3.9 10*3/uL (ref 1.7–7.7)
PLATELETS: 165 10*3/uL (ref 150–400)
RBC: 4.61 MIL/uL (ref 4.22–5.81)
RDW: 12.5 % (ref 11.5–15.5)
WBC: 7.6 10*3/uL (ref 4.0–10.5)

## 2015-09-27 LAB — SURGICAL PCR SCREEN
MRSA, PCR: NEGATIVE
STAPHYLOCOCCUS AUREUS: NEGATIVE

## 2015-09-27 LAB — PROTIME-INR
INR: 1.05 (ref 0.00–1.49)
Prothrombin Time: 13.9 seconds (ref 11.6–15.2)

## 2015-09-27 LAB — APTT: APTT: 28 s (ref 24–37)

## 2015-09-27 LAB — ABO/RH: ABO/RH(D): A POS

## 2015-09-27 NOTE — Progress Notes (Signed)
PCP is Richard L. Cranford Mon  Cardiologist is Daneen Schick. Patient stated he saw Dr. Tamala Julian because his blood pressure "jumped way high" and he had to have a stress test. Patient stated he has not seen Dr. Tamala Julian in a while. LOV was noted in EPIC on 11/25/13. Nurse inquired about patient having any chest pain or discomfort, and patient stated "I have some chest pain sometimes when I'm sitting....it's not severe.Marland KitchenMarland KitchenMarland KitchenI think it's just indigestion....it lasts a minute or so, then it goes away." Patient denied having any acute cardiac issues or discomfort.   Patient stated he last used his Proair inhaler on Saturday.

## 2015-09-28 LAB — URINE CULTURE

## 2015-10-04 MED ORDER — CHLORHEXIDINE GLUCONATE 4 % EX LIQD: 60.0000 mL | Freq: Once | CUTANEOUS | Status: DC

## 2015-10-04 MED ORDER — LACTATED RINGERS IV SOLN
INTRAVENOUS | Status: DC
  Administered 2015-10-07 (×2): via INTRAVENOUS

## 2015-10-04 MED ORDER — DEXTROSE 5 % IV SOLN
3.0000 g | INTRAVENOUS | Status: AC
  Administered 2015-10-07: 3 g via INTRAVENOUS
  Filled 2015-10-04: qty 3000

## 2015-10-04 MED ORDER — POVIDONE-IODINE 7.5 % EX SOLN
Freq: Once | CUTANEOUS | Status: DC
  Filled 2015-10-04: qty 118

## 2015-10-07 ENCOUNTER — Encounter (HOSPITAL_COMMUNITY): Payer: Self-pay | Admitting: *Deleted

## 2015-10-07 ENCOUNTER — Encounter (HOSPITAL_COMMUNITY)
Admission: RE | Disposition: A | Discharge status: Home Health | Payer: Self-pay | Source: Ambulatory Visit | Attending: Orthopedic Surgery

## 2015-10-07 ENCOUNTER — Inpatient Hospital Stay (HOSPITAL_COMMUNITY): Payer: PPO | Admitting: Anesthesiology

## 2015-10-07 ENCOUNTER — Inpatient Hospital Stay (HOSPITAL_COMMUNITY)
Admission: RE | Admit: 2015-10-07 | Discharge: 2015-10-08 | DRG: 470 | Disposition: A | Discharge status: Home Health | Payer: PPO | Source: Ambulatory Visit | Attending: Orthopedic Surgery | Admitting: Orthopedic Surgery

## 2015-10-07 DIAGNOSIS — Z6836 Body mass index (BMI) 36.0-36.9, adult: Secondary | ICD-10-CM | POA: Diagnosis not present

## 2015-10-07 DIAGNOSIS — M179 Osteoarthritis of knee, unspecified: Secondary | ICD-10-CM | POA: Diagnosis not present

## 2015-10-07 DIAGNOSIS — K219 Gastro-esophageal reflux disease without esophagitis: Secondary | ICD-10-CM | POA: Diagnosis not present

## 2015-10-07 DIAGNOSIS — J45909 Unspecified asthma, uncomplicated: Secondary | ICD-10-CM | POA: Diagnosis present

## 2015-10-07 DIAGNOSIS — M1711 Unilateral primary osteoarthritis, right knee: Principal | ICD-10-CM | POA: Diagnosis present

## 2015-10-07 DIAGNOSIS — G4730 Sleep apnea, unspecified: Secondary | ICD-10-CM | POA: Diagnosis not present

## 2015-10-07 DIAGNOSIS — Z79899 Other long term (current) drug therapy: Secondary | ICD-10-CM | POA: Diagnosis not present

## 2015-10-07 DIAGNOSIS — Z87891 Personal history of nicotine dependence: Secondary | ICD-10-CM | POA: Diagnosis not present

## 2015-10-07 DIAGNOSIS — I10 Essential (primary) hypertension: Secondary | ICD-10-CM | POA: Diagnosis not present

## 2015-10-07 DIAGNOSIS — G8918 Other acute postprocedural pain: Secondary | ICD-10-CM | POA: Diagnosis not present

## 2015-10-07 DIAGNOSIS — M25561 Pain in right knee: Secondary | ICD-10-CM | POA: Diagnosis not present

## 2015-10-07 HISTORY — DX: Essential (primary) hypertension: I10

## 2015-10-07 HISTORY — DX: Unilateral primary osteoarthritis, right knee: M17.11

## 2015-10-07 HISTORY — DX: Sleep apnea, unspecified: G47.30

## 2015-10-07 HISTORY — PX: TOTAL KNEE ARTHROPLASTY: SHX125

## 2015-10-07 HISTORY — DX: Unspecified asthma, uncomplicated: J45.909

## 2015-10-07 SURGERY — ARTHROPLASTY, KNEE, TOTAL
Anesthesia: Monitor Anesthesia Care | Site: Knee | Laterality: Right

## 2015-10-07 MED ORDER — OXYCODONE HCL 5 MG PO TABS
ORAL_TABLET | ORAL | Status: AC
  Administered 2015-10-07: 10 mg via ORAL
  Filled 2015-10-07: qty 2

## 2015-10-07 MED ORDER — ALUM & MAG HYDROXIDE-SIMETH 200-200-20 MG/5ML PO SUSP: 30.0000 mL | ORAL | Status: DC | PRN

## 2015-10-07 MED ORDER — PROPOFOL 10 MG/ML IV BOLUS
INTRAVENOUS | Status: AC
  Filled 2015-10-07: qty 20

## 2015-10-07 MED ORDER — CELECOXIB 200 MG PO CAPS
200.0000 mg | ORAL_CAPSULE | Freq: Two times a day (BID) | ORAL | Status: DC
  Administered 2015-10-07 – 2015-10-08 (×3): 200 mg via ORAL
  Filled 2015-10-07 (×3): qty 1

## 2015-10-07 MED ORDER — POTASSIUM CHLORIDE IN NACL 20-0.9 MEQ/L-% IV SOLN
INTRAVENOUS | Status: DC
  Administered 2015-10-07 (×2): via INTRAVENOUS
  Filled 2015-10-07 (×2): qty 1000

## 2015-10-07 MED ORDER — FENTANYL CITRATE (PF) 100 MCG/2ML IJ SOLN
25.0000 ug | INTRAMUSCULAR | Status: DC | PRN
  Administered 2015-10-07 (×2): 50 ug via INTRAVENOUS

## 2015-10-07 MED ORDER — BUPIVACAINE-EPINEPHRINE (PF) 0.25% -1:200000 IJ SOLN
INTRAMUSCULAR | Status: AC
  Filled 2015-10-07: qty 30

## 2015-10-07 MED ORDER — ACETAMINOPHEN 650 MG RE SUPP: 650.0000 mg | Freq: Four times a day (QID) | RECTAL | Status: DC | PRN

## 2015-10-07 MED ORDER — MIDAZOLAM HCL 5 MG/5ML IJ SOLN
INTRAMUSCULAR | Status: DC | PRN
  Administered 2015-10-07 (×2): 1 mg via INTRAVENOUS

## 2015-10-07 MED ORDER — DIPHENHYDRAMINE HCL 12.5 MG/5ML PO ELIX: 12.5000 mg | ORAL_SOLUTION | ORAL | Status: DC | PRN

## 2015-10-07 MED ORDER — PROMETHAZINE HCL 25 MG/ML IJ SOLN: 6.2500 mg | INTRAMUSCULAR | Status: DC | PRN

## 2015-10-07 MED ORDER — MIDAZOLAM HCL 2 MG/2ML IJ SOLN
INTRAMUSCULAR | Status: AC
  Filled 2015-10-07: qty 2

## 2015-10-07 MED ORDER — METOCLOPRAMIDE HCL 5 MG PO TABS: 5.0000 mg | ORAL_TABLET | Freq: Three times a day (TID) | ORAL | Status: DC | PRN

## 2015-10-07 MED ORDER — ASPIRIN EC 325 MG PO TBEC
325.0000 mg | DELAYED_RELEASE_TABLET | Freq: Every day | ORAL | Status: DC
  Administered 2015-10-08: 325 mg via ORAL
  Filled 2015-10-07: qty 1

## 2015-10-07 MED ORDER — BUDESONIDE 0.5 MG/2ML IN SUSP
0.5000 mg | Freq: Two times a day (BID) | RESPIRATORY_TRACT | Status: DC
  Administered 2015-10-07 – 2015-10-08 (×2): 0.5 mg via RESPIRATORY_TRACT
  Filled 2015-10-07 (×2): qty 2

## 2015-10-07 MED ORDER — DEXAMETHASONE SODIUM PHOSPHATE 4 MG/ML IJ SOLN
INTRAMUSCULAR | Status: DC | PRN
  Administered 2015-10-07: 10 mg via INTRAVENOUS

## 2015-10-07 MED ORDER — PHENOL 1.4 % MT LIQD: 1.0000 | OROMUCOSAL | Status: DC | PRN

## 2015-10-07 MED ORDER — OXYCODONE HCL 5 MG PO TABS
5.0000 mg | ORAL_TABLET | ORAL | Status: DC | PRN
  Administered 2015-10-07 – 2015-10-08 (×4): 10 mg via ORAL
  Filled 2015-10-07 (×3): qty 2

## 2015-10-07 MED ORDER — FENTANYL CITRATE (PF) 100 MCG/2ML IJ SOLN
INTRAMUSCULAR | Status: DC | PRN
  Administered 2015-10-07: 25 ug via INTRAVENOUS
  Administered 2015-10-07: 50 ug via INTRAVENOUS
  Administered 2015-10-07: 25 ug via INTRAVENOUS

## 2015-10-07 MED ORDER — BUPIVACAINE-EPINEPHRINE (PF) 0.5% -1:200000 IJ SOLN
INTRAMUSCULAR | Status: DC | PRN
  Administered 2015-10-07: 25 mL via PERINEURAL

## 2015-10-07 MED ORDER — LACTATED RINGERS IV SOLN: INTRAVENOUS | Status: DC

## 2015-10-07 MED ORDER — DEXAMETHASONE SODIUM PHOSPHATE 10 MG/ML IJ SOLN
10.0000 mg | Freq: Three times a day (TID) | INTRAMUSCULAR | Status: DC
  Administered 2015-10-07 – 2015-10-08 (×3): 10 mg via INTRAVENOUS
  Filled 2015-10-07 (×3): qty 1

## 2015-10-07 MED ORDER — MEPERIDINE HCL 25 MG/ML IJ SOLN: 6.2500 mg | INTRAMUSCULAR | Status: DC | PRN

## 2015-10-07 MED ORDER — SODIUM CHLORIDE 0.9 % IR SOLN
Status: DC | PRN
  Administered 2015-10-07: 3000 mL

## 2015-10-07 MED ORDER — PROPOFOL 500 MG/50ML IV EMUL
INTRAVENOUS | Status: DC | PRN
  Administered 2015-10-07: 50 ug/kg/min via INTRAVENOUS

## 2015-10-07 MED ORDER — DOCUSATE SODIUM 100 MG PO CAPS
100.0000 mg | ORAL_CAPSULE | Freq: Two times a day (BID) | ORAL | Status: DC
  Administered 2015-10-07 – 2015-10-08 (×3): 100 mg via ORAL
  Filled 2015-10-07 (×3): qty 1

## 2015-10-07 MED ORDER — ALBUTEROL SULFATE (2.5 MG/3ML) 0.083% IN NEBU: 3.0000 mL | INHALATION_SOLUTION | RESPIRATORY_TRACT | Status: DC | PRN

## 2015-10-07 MED ORDER — BUPIVACAINE IN DEXTROSE 0.75-8.25 % IT SOLN
INTRATHECAL | Status: DC | PRN
  Administered 2015-10-07: 1.5 mL via INTRATHECAL

## 2015-10-07 MED ORDER — ACETAMINOPHEN 325 MG PO TABS
ORAL_TABLET | ORAL | Status: AC
  Administered 2015-10-07: 650 mg via ORAL
  Filled 2015-10-07: qty 2

## 2015-10-07 MED ORDER — ONDANSETRON HCL 4 MG/2ML IJ SOLN: 4.0000 mg | Freq: Four times a day (QID) | INTRAMUSCULAR | Status: DC | PRN

## 2015-10-07 MED ORDER — HYDROMORPHONE HCL 1 MG/ML IJ SOLN
1.0000 mg | INTRAMUSCULAR | Status: DC | PRN
  Administered 2015-10-07: 1 mg via INTRAVENOUS
  Filled 2015-10-07: qty 1

## 2015-10-07 MED ORDER — FENTANYL CITRATE (PF) 250 MCG/5ML IJ SOLN
INTRAMUSCULAR | Status: AC
  Filled 2015-10-07: qty 5

## 2015-10-07 MED ORDER — CEFAZOLIN SODIUM-DEXTROSE 2-3 GM-% IV SOLR
2.0000 g | Freq: Four times a day (QID) | INTRAVENOUS | Status: AC
  Administered 2015-10-07 (×2): 2 g via INTRAVENOUS
  Filled 2015-10-07 (×2): qty 50

## 2015-10-07 MED ORDER — ONDANSETRON HCL 4 MG/2ML IJ SOLN
INTRAMUSCULAR | Status: DC | PRN
  Administered 2015-10-07: 4 mg via INTRAVENOUS

## 2015-10-07 MED ORDER — FENTANYL CITRATE (PF) 100 MCG/2ML IJ SOLN
INTRAMUSCULAR | Status: AC
  Administered 2015-10-07: 50 ug via INTRAVENOUS
  Filled 2015-10-07: qty 2

## 2015-10-07 MED ORDER — MENTHOL 3 MG MT LOZG: 1.0000 | LOZENGE | OROMUCOSAL | Status: DC | PRN

## 2015-10-07 MED ORDER — POLYETHYLENE GLYCOL 3350 17 G PO PACK
17.0000 g | PACK | Freq: Two times a day (BID) | ORAL | Status: DC
  Administered 2015-10-07 – 2015-10-08 (×2): 17 g via ORAL
  Filled 2015-10-07 (×2): qty 1

## 2015-10-07 MED ORDER — METOCLOPRAMIDE HCL 5 MG/ML IJ SOLN: 5.0000 mg | Freq: Three times a day (TID) | INTRAMUSCULAR | Status: DC | PRN

## 2015-10-07 MED ORDER — ACETAMINOPHEN 325 MG PO TABS
650.0000 mg | ORAL_TABLET | Freq: Four times a day (QID) | ORAL | Status: DC | PRN
  Administered 2015-10-07: 650 mg via ORAL

## 2015-10-07 MED ORDER — ONDANSETRON HCL 4 MG PO TABS: 4.0000 mg | ORAL_TABLET | Freq: Four times a day (QID) | ORAL | Status: DC | PRN

## 2015-10-07 MED ORDER — BUPIVACAINE-EPINEPHRINE 0.25% -1:200000 IJ SOLN
INTRAMUSCULAR | Status: DC | PRN
  Administered 2015-10-07: 30 mL

## 2015-10-07 SURGICAL SUPPLY — 73 items
BANDAGE ELASTIC 6 VELCRO ST LF (GAUZE/BANDAGES/DRESSINGS) ×3 IMPLANT
BANDAGE ESMARK 6X9 LF (GAUZE/BANDAGES/DRESSINGS) ×1 IMPLANT
BENZOIN TINCTURE PRP APPL 2/3 (GAUZE/BANDAGES/DRESSINGS) ×3 IMPLANT
BLADE SAGITTAL 25.0X1.19X90 (BLADE) ×2 IMPLANT
BLADE SAGITTAL 25.0X1.19X90MM (BLADE) ×1
BLADE SAW RECIP 87.9 MT (BLADE) ×3 IMPLANT
BLADE SAW SAG 90X13X1.27 (BLADE) ×3 IMPLANT
BLADE SURG 10 STRL SS (BLADE) ×6 IMPLANT
BNDG ELASTIC 6X15 VLCR STRL LF (GAUZE/BANDAGES/DRESSINGS) ×3 IMPLANT
BNDG ESMARK 6X9 LF (GAUZE/BANDAGES/DRESSINGS) ×3
BOWL SMART MIX CTS (DISPOSABLE) ×3 IMPLANT
CAPT KNEE TOTAL 3 ATTUNE ×3 IMPLANT
CEMENT HV SMART SET (Cement) ×6 IMPLANT
CLOSURE STERI-STRIP 1/2X4 (GAUZE/BANDAGES/DRESSINGS) ×1
CLOSURE WOUND 1/2 X4 (GAUZE/BANDAGES/DRESSINGS) ×1
CLSR STERI-STRIP ANTIMIC 1/2X4 (GAUZE/BANDAGES/DRESSINGS) ×2 IMPLANT
COVER SURGICAL LIGHT HANDLE (MISCELLANEOUS) ×6 IMPLANT
CUFF TOURNIQUET SINGLE 34IN LL (TOURNIQUET CUFF) ×3 IMPLANT
DRAPE EXTREMITY T 121X128X90 (DRAPE) ×3 IMPLANT
DRAPE INCISE IOBAN 66X45 STRL (DRAPES) ×6 IMPLANT
DRAPE PROXIMA HALF (DRAPES) ×3 IMPLANT
DRAPE U-SHAPE 47X51 STRL (DRAPES) ×3 IMPLANT
DRSG AQUACEL AG ADV 3.5X10 (GAUZE/BANDAGES/DRESSINGS) ×3 IMPLANT
DRSG AQUACEL AG ADV 3.5X14 (GAUZE/BANDAGES/DRESSINGS) ×3 IMPLANT
DURAPREP 26ML APPLICATOR (WOUND CARE) ×6 IMPLANT
ELECT CAUTERY BLADE 6.4 (BLADE) ×3 IMPLANT
ELECT REM PT RETURN 9FT ADLT (ELECTROSURGICAL) ×3
ELECTRODE REM PT RTRN 9FT ADLT (ELECTROSURGICAL) ×1 IMPLANT
FACESHIELD WRAPAROUND (MASK) ×3 IMPLANT
GAUZE SPONGE 4X4 12PLY STRL (GAUZE/BANDAGES/DRESSINGS) IMPLANT
GAUZE SPONGE 4X4 16PLY XRAY LF (GAUZE/BANDAGES/DRESSINGS) ×3 IMPLANT
GLOVE BIO SURGEON STRL SZ7 (GLOVE) ×6 IMPLANT
GLOVE BIOGEL PI IND STRL 7.0 (GLOVE) ×3 IMPLANT
GLOVE BIOGEL PI IND STRL 7.5 (GLOVE) ×1 IMPLANT
GLOVE BIOGEL PI INDICATOR 7.0 (GLOVE) ×6
GLOVE BIOGEL PI INDICATOR 7.5 (GLOVE) ×2
GLOVE ECLIPSE 7.0 STRL STRAW (GLOVE) ×3 IMPLANT
GLOVE SS BIOGEL STRL SZ 7.5 (GLOVE) ×1 IMPLANT
GLOVE SUPERSENSE BIOGEL SZ 7.5 (GLOVE) ×2
GOWN STRL REUS W/ TWL LRG LVL3 (GOWN DISPOSABLE) ×1 IMPLANT
GOWN STRL REUS W/ TWL XL LVL3 (GOWN DISPOSABLE) ×2 IMPLANT
GOWN STRL REUS W/TWL LRG LVL3 (GOWN DISPOSABLE) ×2
GOWN STRL REUS W/TWL XL LVL3 (GOWN DISPOSABLE) ×4
HANDPIECE INTERPULSE COAX TIP (DISPOSABLE) ×2
HOOD PEEL AWAY FACE SHEILD DIS (HOOD) ×9 IMPLANT
IMMOBILIZER KNEE 22 (SOFTGOODS) ×3 IMPLANT
IMMOBILIZER KNEE 22 UNIV (SOFTGOODS) ×3 IMPLANT
KIT BASIN OR (CUSTOM PROCEDURE TRAY) ×3 IMPLANT
KIT ROOM TURNOVER OR (KITS) ×3 IMPLANT
MANIFOLD NEPTUNE II (INSTRUMENTS) ×3 IMPLANT
MARKER SKIN DUAL TIP RULER LAB (MISCELLANEOUS) ×3 IMPLANT
NEEDLE 18GX1X1/2 (RX/OR ONLY) (NEEDLE) ×3 IMPLANT
NS IRRIG 1000ML POUR BTL (IV SOLUTION) ×3 IMPLANT
PACK TOTAL JOINT (CUSTOM PROCEDURE TRAY) ×3 IMPLANT
PAD ARMBOARD 7.5X6 YLW CONV (MISCELLANEOUS) ×6 IMPLANT
SET HNDPC FAN SPRY TIP SCT (DISPOSABLE) ×1 IMPLANT
STRIP CLOSURE SKIN 1/2X4 (GAUZE/BANDAGES/DRESSINGS) ×2 IMPLANT
SUCTION FRAZIER HANDLE 10FR (MISCELLANEOUS) ×2
SUCTION TUBE FRAZIER 10FR DISP (MISCELLANEOUS) ×1 IMPLANT
SUT MNCRL AB 3-0 PS2 18 (SUTURE) ×3 IMPLANT
SUT VIC AB 0 CT1 27 (SUTURE) ×6
SUT VIC AB 0 CT1 27XBRD ANBCTR (SUTURE) ×3 IMPLANT
SUT VIC AB 1 CT1 27 (SUTURE) ×2
SUT VIC AB 1 CT1 27XBRD ANBCTR (SUTURE) ×1 IMPLANT
SUT VIC AB 2-0 CT1 27 (SUTURE) ×6
SUT VIC AB 2-0 CT1 TAPERPNT 27 (SUTURE) ×3 IMPLANT
SYR 30ML SLIP (SYRINGE) ×3 IMPLANT
TOWEL OR 17X24 6PK STRL BLUE (TOWEL DISPOSABLE) ×3 IMPLANT
TOWEL OR 17X26 10 PK STRL BLUE (TOWEL DISPOSABLE) ×3 IMPLANT
TRAY FOLEY CATH 16FR SILVER (SET/KITS/TRAYS/PACK) ×3 IMPLANT
TUBE CONNECTING 12'X1/4 (SUCTIONS) ×1
TUBE CONNECTING 12X1/4 (SUCTIONS) ×2 IMPLANT
YANKAUER SUCT BULB TIP NO VENT (SUCTIONS) ×3 IMPLANT

## 2015-10-07 NOTE — Anesthesia Procedure Notes (Addendum)
Anesthesia Regional Block:  Adductor canal block  Pre-Anesthetic Checklist: ,, timeout performed, Correct Patient, Correct Site, Correct Laterality, Correct Procedure, Correct Position, site marked, Risks and benefits discussed,  Surgical consent,  Pre-op evaluation,  At surgeon's request and post-op pain management  Laterality: Right and Lower  Prep: Dura Prep       Needles:  Injection technique: Single-shot  Needle Type: Stimiplex     Needle Length: 10cm 10 cm Needle Gauge: 21 and 21 G    Additional Needles:  Procedures: ultrasound guided (picture in chart) Adductor canal block Narrative:  Injection made incrementally with aspirations every 5 mL.  Performed by: Personally   Additional Notes: Patient tolerated the procedure well without complications   Spinal Patient location during procedure: OR Staffing Anesthesiologist: Montez Hageman Performed by: anesthesiologist  Preanesthetic Checklist Completed: patient identified, site marked, surgical consent, pre-op evaluation, timeout performed, IV checked, risks and benefits discussed and monitors and equipment checked Spinal Block Patient position: sitting Prep: Betadine Patient monitoring: heart rate, continuous pulse ox and blood pressure Approach: midline Location: L3-4 Injection technique: single-shot Needle Needle type: Sprotte  Needle gauge: 24 G Needle length: 9 cm Additional Notes Expiration date of kit checked and confirmed. Patient tolerated procedure well, without complications.

## 2015-10-07 NOTE — Transfer of Care (Signed)
Immediate Anesthesia Transfer of Care Note  Patient: Kenneth Hester  Procedure(s) Performed: Procedure(s): TOTAL KNEE ARTHROPLASTY (Right)  Patient Location: PACU  Anesthesia Type:MAC and Spinal  Level of Consciousness: awake, alert  and oriented  Airway & Oxygen Therapy: Patient Spontanous Breathing and Patient connected to nasal cannula oxygen  Post-op Assessment: Report given to RN and Post -op Vital signs reviewed and stable  Post vital signs: Reviewed and stable  Last Vitals:  Filed Vitals:   10/07/15 0612 10/07/15 0926  BP: 173/82   Pulse: 73 79  Temp:  36.4 C  Resp: 18 25    Complications: No apparent anesthesia complications

## 2015-10-07 NOTE — Anesthesia Preprocedure Evaluation (Addendum)
Anesthesia Evaluation  Patient identified by MRN, date of birth, ID band Patient awake    Reviewed: Allergy & Precautions, NPO status , Patient's Chart, lab work & pertinent test results  Airway Mallampati: II  TM Distance: >3 FB Neck ROM: Full    Dental no notable dental hx. (+) Teeth Intact   Pulmonary asthma , sleep apnea and Continuous Positive Airway Pressure Ventilation , former smoker,    Pulmonary exam normal breath sounds clear to auscultation       Cardiovascular hypertension, Pt. on medications Normal cardiovascular exam Rhythm:Regular Rate:Normal     Neuro/Psych negative neurological ROS  negative psych ROS   GI/Hepatic negative GI ROS, Neg liver ROS,   Endo/Other  negative endocrine ROS  Renal/GU negative Renal ROS  negative genitourinary   Musculoskeletal negative musculoskeletal ROS (+)   Abdominal   Peds negative pediatric ROS (+)  Hematology negative hematology ROS (+)   Anesthesia Other Findings   Reproductive/Obstetrics negative OB ROS                          Anesthesia Physical Anesthesia Plan  ASA: II  Anesthesia Plan: Spinal and MAC   Post-op Pain Management: GA combined w/ Regional for post-op pain   Induction:   Airway Management Planned: Simple Face Mask  Additional Equipment:   Intra-op Plan:   Post-operative Plan:   Informed Consent: I have reviewed the patients History and Physical, chart, labs and discussed the procedure including the risks, benefits and alternatives for the proposed anesthesia with the patient or authorized representative who has indicated his/her understanding and acceptance.   Dental advisory given  Plan Discussed with: Anesthesiologist and Surgeon  Anesthesia Plan Comments: (Adductor canal block)       Anesthesia Quick Evaluation

## 2015-10-07 NOTE — Anesthesia Postprocedure Evaluation (Signed)
Anesthesia Post Note  Patient: Kenneth Hester  Procedure(s) Performed: Procedure(s) (LRB): TOTAL KNEE ARTHROPLASTY (Right)  Patient location during evaluation: PACU Anesthesia Type: General and Regional Level of consciousness: awake and alert Pain management: pain level controlled Vital Signs Assessment: post-procedure vital signs reviewed and stable Respiratory status: spontaneous breathing, nonlabored ventilation, respiratory function stable and patient connected to nasal cannula oxygen Cardiovascular status: blood pressure returned to baseline and stable Postop Assessment: no signs of nausea or vomiting Anesthetic complications: no    Last Vitals:  Filed Vitals:   10/07/15 1048 10/07/15 1634  BP: 133/64 145/70  Pulse: 72 76  Temp: 36.6 C 36.7 C  Resp: 18 18    Last Pain:  Filed Vitals:   10/07/15 1635  PainSc: 0-No pain                 Montez Hageman

## 2015-10-07 NOTE — Progress Notes (Signed)
Utilization review completed.  

## 2015-10-07 NOTE — Progress Notes (Signed)
Patient admitted from PACU around 1045H. CPM was on for 3hrs. Orientation to safety done and patient verbalized understanding.

## 2015-10-07 NOTE — Evaluation (Signed)
Physical Therapy Evaluation Patient Details Name: Kenneth Hester MRN: [id number] DOB: 1950/11/08 Today's Date: 10/07/2015   History of Present Illness  pt admitted for R TKA. PMHx: sleep apnea, obesity, GERD, HTN, asthma  Clinical Impression  Pt very pleasant and moving well POD #0. Pt able to walk in hall, perform heel slides and SLR. Pt with decreased strength, activity tolerance and gait who will benefit from acute therapy to maximize mobility, strength and function to decrease burden of care. Pt educated for HEP with handout, CPM use, bone foam, transfers and gait.     Follow Up Recommendations Home health PT    Equipment Recommendations  None recommended by PT    Recommendations for Other Services       Precautions / Restrictions Precautions Precautions: Knee Required Braces or Orthoses: Knee Immobilizer - Right Knee Immobilizer - Right: On when out of bed or walking;Discontinue once straight leg raise with < 10 degree lag Restrictions Weight Bearing Restrictions: Yes RLE Weight Bearing: Weight bearing as tolerated      Mobility  Bed Mobility Overal bed mobility: Modified Independent                Transfers Overall transfer level: Needs assistance   Transfers: Sit to/from Stand Sit to Stand: Supervision         General transfer comment: cues for hand placement and safety  Ambulation/Gait Ambulation/Gait assistance: Supervision Ambulation Distance (Feet): 120 Feet Assistive device: Rolling walker (2 wheeled) Gait Pattern/deviations: Step-through pattern;Decreased stride length;Decreased stance time - right   Gait velocity interpretation: Below normal speed for age/gender General Gait Details: cues for posture, position in RW, step through gait and decreased upper body weight bearing  Stairs            Wheelchair Mobility    Modified Rankin (Stroke Patients Only)       Balance                                              Pertinent Vitals/Pain Pain Assessment: 0-10 Pain Score: 6  Pain Location: right knee Pain Descriptors / Indicators: Burning Pain Intervention(s): Limited activity within patient's tolerance;Monitored during session;Premedicated before session;Repositioned    Home Living Family/patient expects to be discharged to:: Private residence Living Arrangements: Spouse/significant other Available Help at Discharge: Family;Available 24 hours/day Type of Home: House Home Access: Stairs to enter Entrance Stairs-Rails: Left Entrance Stairs-Number of Steps: 2 Home Layout: One level Home Equipment: Walker - 2 wheels;Cane - single point;Bedside commode      Prior Function Level of Independence: Independent               Hand Dominance        Extremity/Trunk Assessment   Upper Extremity Assessment: Overall WFL for tasks assessed           Lower Extremity Assessment: RLE deficits/detail RLE Deficits / Details: decreased strength and ROM as expected post op    Cervical / Trunk Assessment: Normal  Communication   Communication: No difficulties  Cognition Arousal/Alertness: Awake/alert Behavior During Therapy: WFL for tasks assessed/performed Overall Cognitive Status: Within Functional Limits for tasks assessed                      General Comments      Exercises Total Joint Exercises Heel Slides: AROM;Right;10 reps;Supine Straight Leg Raises: AROM;Right;10 reps;Supine  Assessment/Plan    PT Assessment Patient needs continued PT services  PT Diagnosis Difficulty walking;Acute pain   PT Problem List Decreased strength;Decreased range of motion;Decreased activity tolerance;Decreased mobility;Pain;Decreased knowledge of use of DME  PT Treatment Interventions DME instruction;Gait training;Stair training;Functional mobility training;Therapeutic activities;Therapeutic exercise;Patient/family education   PT Goals (Current goals can be found in the Care Plan  section) Acute Rehab PT Goals Patient Stated Goal: hunt, fish, work Psychologist, prison and probation services) PT Goal Formulation: With patient/family Time For Goal Achievement: 10/14/15 Potential to Achieve Goals: Good    Frequency 7X/week   Barriers to discharge        Co-evaluation               End of Session Equipment Utilized During Treatment: Gait belt;Right knee immobilizer Activity Tolerance: Patient tolerated treatment well Patient left: in chair;with call bell/phone within reach;with family/visitor present;Other (comment) (in bone foam) Nurse Communication: Mobility status;Weight bearing status         Time: [id number] PT Time Calculation (min) (ACUTE ONLY): 25 min   Charges:   PT Evaluation $PT Eval Moderate Complexity: 1 Procedure PT Treatments $Gait Training: 8-22 mins   PT G CodesMelford Aase 10/07/2015, 1:15 PM Elwyn Reach, [place]

## 2015-10-07 NOTE — Op Note (Signed)
MRN:     [id number] DOB/AGE:    [birth year] / 67 y.o.       OPERATIVE REPORT    DATE OF PROCEDURE:  10/07/2015       PREOPERATIVE DIAGNOSIS:   Primary localized OA right knee      Estimated body mass index is 36.43 kg/(m^2) as calculated from the following:   Height as of this encounter: 5\' 10"  (1.778 m).   Weight as of this encounter: 115.168 kg (253 lb 14.4 oz).                                                        POSTOPERATIVE DIAGNOSIS:   same                                                                      PROCEDURE:  Procedure(s): TOTAL KNEE ARTHROPLASTY Using Depuy Attune RP implants #7 Femur, #8Tibia, 34mm  RP bearing, 35 Patella     SURGEON: Zerek Litsey A    ASSISTANT:  Kirstin Shepperson PA-C   (Present and scrubbed throughout the case, critical for assistance with exposure, retraction, instrumentation, and closure.)         ANESTHESIA: Spinal with Adductor Nerve Block     TOURNIQUET TIME: [id number]   COMPLICATIONS:  None     SPECIMENS: None   INDICATIONS FOR PROCEDURE: The patient has  djd right knee, varus deformities, XR shows bone on bone arthritis. Patient has failed all conservative measures including anti-inflammatory medicines, narcotics, attempts at  exercise and weight loss, cortisone injections and viscosupplementation.  Risks and benefits of surgery have been discussed, questions answered.   DESCRIPTION OF PROCEDURE: The patient identified by armband, received  right femoral nerve block and IV antibiotics, in the holding area at [facility]. Patient taken to the operating room, appropriate anesthetic  monitors were attached General endotracheal anesthesia induced with  the patient in supine position, Foley catheter was inserted. Tourniquet  applied high to the operative thigh. Lateral post and foot positioner  applied to the table, the lower extremity was then prepped and draped  in usual sterile fashion from the ankle to the tourniquet. Time-out  procedure was performed. The limb was wrapped with an Esmarch bandage and the tourniquet inflated to 365 mmHg. We began the operation by making the anterior midline incision starting at handbreadth above the patella going over the patella 1 cm medial to and  4 cm distal to the tibial tubercle. Small bleeders in the skin and the  subcutaneous tissue identified and cauterized. Transverse retinaculum was incised and reflected medially and a medial parapatellar arthrotomy was accomplished. the patella was everted and theprepatellar fat pad resected. The superficial medial collateral  ligament was then elevated from anterior to posterior along the proximal  flare of the tibia and anterior half of the menisci resected. The knee was hyperflexed exposing bone on bone arthritis. Peripheral and notch osteophytes as well as the cruciate ligaments were then resected. We continued to  work our way around posteriorly along the proximal tibia, and externally  rotated the tibia  subluxing it out from underneath the femur. A McHale  retractor was placed through the notch and a lateral Hohmann retractor  placed, and we then drilled through the proximal tibia in line with the  axis of the tibia followed by an intramedullary guide rod and 2-degree  posterior slope cutting guide. The tibial cutting guide was pinned into place  allowing resection of 4 mm of bone medially and about 6 mm of bone  laterally because of her varus deformity. Satisfied with the tibial resection, we then  entered the distal femur 2 mm anterior to the PCL origin with the  intramedullary guide rod and applied the distal femoral cutting guide  set at 16mm, with 5 degrees of valgus. This was pinned along the  epicondylar axis. At this point, the distal femoral cut was accomplished without difficulty. We then sized for a #7 femoral component and pinned the guide in 3 degrees of external rotation.The chamfer cutting guide was pinned into place. The  anterior, posterior, and chamfer cuts were accomplished without difficulty followed by  the  RP box cutting guide and the box cut. We also removed posterior osteophytes from the posterior femoral condyles. At this  time, the knee was brought into full extension. We checked our  extension and flexion gaps and found them symmetric at 32mm.  The patella thickness measured at 25 mm. We set the cutting guide at 15 and removed the posterior 9.5-10 mm  of the patella sized for 35 button and drilled the lollipop. The knee  was then once again hyperflexed exposing the proximal tibia. We sized for a #8 tibial base plate, applied the smokestack and the conical reamer followed by the the Delta fin keel punch. We then hammered into place the  RP trial femoral component, inserted a 1 trial bearing, trial patellar button, and took the knee through range of motion from 0-130 degrees. No thumb pressure was required for patellar  tracking. At this point, all trial components were removed, a double batch of DePuy HV cement  was mixed and applied to all bony metallic mating surfaces except for the posterior condyles of the femur itself. In order, we  hammered into place the tibial tray and removed excess cement, the femoral component and removed excess cement, a 71mm  RP bearing  was inserted, and the knee brought to full extension with compression.  The patellar button was clamped into place, and excess cement  removed. While the cement cured the wound was irrigated out with normal saline solution pulse lavage.. Ligament stability and patellar tracking were checked and found to be excellent.. The parapatellar arthrotomy was closed with  #1 Vicryl suture. The subcutaneous tissue with 0 and 2-0 undyed  Vicryl suture, and 4-0 Monocryl.. A dressing of Aquaseal,  4 x 4, dressing sponges, Webril, and Ace wrap applied. Needle and sponge count were correct times 2.The patient awakened, extubated, and taken to recovery room without  difficulty. Vascular status was normal, pulses 2+ and symmetric.   Chestina Komatsu A 10/07/2015, 8:58 AM

## 2015-10-07 NOTE — Interval H&P Note (Signed)
History and Physical Interval Note:  10/07/2015 6:44 AM  [name]  has presented today for surgery, with the diagnosis of Primary localized OA right knee  The various methods of treatment have been discussed with the patient and family. After consideration of risks, benefits and other options for treatment, the patient has consented to  Procedure(s): TOTAL KNEE ARTHROPLASTY (Right) as a surgical intervention .  The patient's history has been reviewed, patient examined, no change in status, stable for surgery.  I have reviewed the patient's chart and labs.  Questions were answered to the patient's satisfaction.     Kenneth Hester A

## 2015-10-07 NOTE — Progress Notes (Signed)
Orthopedic Tech Progress Note Patient Details:  Kenneth Hester [birth year] [id number]  CPM Right Knee CPM Right Knee: On Right Knee Flexion (Degrees): 90 Right Knee Extension (Degrees): 0 Additional Comments: trapeze bar patient helper  Viewed order from doctor's order list [name] 10/07/2015, 10:09 AM

## 2015-10-08 ENCOUNTER — Encounter (HOSPITAL_COMMUNITY): Payer: Self-pay | Admitting: Orthopedic Surgery

## 2015-10-08 DIAGNOSIS — M1711 Unilateral primary osteoarthritis, right knee: Secondary | ICD-10-CM | POA: Diagnosis not present

## 2015-10-08 DIAGNOSIS — Z96651 Presence of right artificial knee joint: Secondary | ICD-10-CM | POA: Diagnosis not present

## 2015-10-08 LAB — BASIC METABOLIC PANEL
Anion gap: 12 (ref 5–15)
BUN: 20 mg/dL (ref 6–20)
CO2: 22 mmol/L (ref 22–32)
Calcium: 8.8 mg/dL — ABNORMAL LOW (ref 8.9–10.3)
Chloride: 103 mmol/L (ref 101–111)
Creatinine, Ser: 1.33 mg/dL — ABNORMAL HIGH (ref 0.61–1.24)
GFR calc Af Amer: >60 mL/min (ref >60)
GFR calc non Af Amer: 54 mL/min — ABNORMAL LOW (ref >60)
Glucose, Bld: 196 mg/dL — ABNORMAL HIGH (ref 65–99)
Potassium: 4.3 mmol/L (ref 3.5–5.1)
Sodium: 137 mmol/L (ref 135–145)

## 2015-10-08 LAB — CBC
HCT: 38 % — ABNORMAL LOW (ref 39.0–52.0)
Hemoglobin: 13 g/dL (ref 13.0–17.0)
MCH: 32.6 pg (ref 26.0–34.0)
MCHC: 34.2 g/dL (ref 30.0–36.0)
MCV: 95.2 fL (ref 78.0–100.0)
PLATELETS: 152 10*3/uL (ref 150–400)
RBC: 3.99 MIL/uL — AB (ref 4.22–5.81)
RDW: 12.5 % (ref 11.5–15.5)
WBC: 18.5 10*3/uL — ABNORMAL HIGH (ref 4.0–10.5)

## 2015-10-08 MED ORDER — OXYCODONE HCL 5 MG PO TABS: ORAL_TABLET | ORAL | Status: DC

## 2015-10-08 MED ORDER — ASPIRIN 325 MG PO TBEC: DELAYED_RELEASE_TABLET | ORAL | Status: DC

## 2015-10-08 MED ORDER — CELECOXIB 200 MG PO CAPS: ORAL_CAPSULE | ORAL | Status: DC

## 2015-10-08 MED ORDER — DOCUSATE SODIUM 100 MG PO CAPS: ORAL_CAPSULE | ORAL | Status: DC

## 2015-10-08 MED ORDER — POLYETHYLENE GLYCOL 3350 17 G PO PACK: PACK | ORAL | Status: DC

## 2015-10-08 NOTE — Evaluation (Signed)
Occupational Therapy Evaluation Patient Details Name: Kenneth Hester MRN: [id number] DOB: [birth year] Today's Date: 10/08/2015    History of Present Illness pt admitted for R TKA. PMHx: sleep apnea, obesity, GERD, HTN, asthma   Clinical Impression   Patient presenting with decreased balance, decreased self care, and decreased functional transfers. Patient reports being I PTA. Patient currently functioning at Mod I - supervision. Patient will benefit from acute OT to increase overall independence in the areas of ADLs, functional mobility, safety in order to safely discharge home.    Follow Up Recommendations  No OT follow up;Supervision - Intermittent    Equipment Recommendations  Other (comment) (owns all equipment)    Recommendations for Other Services       Precautions / Restrictions Precautions Precautions: Knee Restrictions Weight Bearing Restrictions: Yes RLE Weight Bearing: Weight bearing as tolerated      Mobility Bed Mobility      General bed mobility comments: in recliner chair upon entering the room  Transfers Overall transfer level: Needs assistance Equipment used: Rolling walker (2 wheeled) Transfers: Sit to/from Stand;Stand Pivot Transfers Sit to Stand: Modified independent (Device/Increase time) Stand pivot transfers: Supervision       General transfer comment: cues for hand placement and safety for shower transfer         ADL Overall ADL's : Needs assistance/impaired     Grooming: Standing;Supervision/safety   Upper Body Bathing: Supervision/ safety;Sitting   Lower Body Bathing: Supervison/ safety;Sit to/from stand   Upper Body Dressing : Sitting;Supervision/safety   Lower Body Dressing: Supervision/safety;Sit to/from stand   Toilet Transfer: RW;Comfort height toilet;Ambulation;Supervision/safety   Toileting- Clothing Manipulation and Hygiene: Sit to/from stand;Supervision/safety   Tub/ Banker: Walk-in shower;Shower  seat;Ambulation;Rolling walker;Supervision/safety Tub/Shower Transfer Details (indicate cue type and reason): simulated   General ADL Comments: Pt ambulated 100' to gym for simulated walk in shower transfer. OT educated and demonstrated how to safely step over threshold with use of RW. Pt returned demonstration with supervision for safety. OT recommended pt have supervision at home and to sit on 3 in 1 commode chair when washing LB for safety.               Pertinent Vitals/Pain Pain Assessment: 0-10 Pain Score: 4  Pain Location: R knee Pain Descriptors / Indicators: Aching;Sore Pain Intervention(s): Limited activity within patient's tolerance;Monitored during session;Repositioned     Hand Dominance Right   Extremity/Trunk Assessment Upper Extremity Assessment Upper Extremity Assessment: Overall WFL for tasks assessed   Lower Extremity Assessment Lower Extremity Assessment: Defer to PT evaluation   Cervical / Trunk Assessment Cervical / Trunk Assessment: Normal   Communication Communication Communication: No difficulties   Cognition Arousal/Alertness: Awake/alert Behavior During Therapy: WFL for tasks assessed/performed Overall Cognitive Status: Within Functional Limits for tasks assessed                   Home Living Family/patient expects to be discharged to:: Private residence Living Arrangements: Spouse/significant other Available Help at Discharge: Family;Available 24 hours/day Type of Home: House Home Access: Stairs to enter [employer] of Steps: 2 Entrance Stairs-Rails: Left Home Layout: One level     Bathroom Shower/Tub: Walk-in shower;Door   [employer] Toilet: Standard Bathroom Accessibility: Yes   Home Equipment: Environmental consultant - 2 wheels;Cane - single point;Bedside commode          Prior Functioning/Environment Level of Independence: Independent             OT Diagnosis: Acute pain   OT Problem List:  Decreased strength;Decreased  range of motion;Decreased activity tolerance;Impaired balance (sitting and/or standing);Decreased safety awareness;Pain;Decreased knowledge of use of DME or AE   OT Treatment/Interventions: Self-care/ADL training;Therapeutic exercise;Balance training;Therapeutic activities;Energy conservation;DME and/or AE instruction;Patient/family education    OT Goals(Current goals can be found in the care plan section) Acute Rehab OT Goals Patient Stated Goal: get back home and to work OT Goal Formulation: With patient Time For Goal Achievement: 10/22/15 Potential to Achieve Goals: Good ADL Goals Pt Will Perform Lower Body Bathing: with modified independence Pt Will Perform Lower Body Dressing: with modified independence;sit to/from stand Pt Will Transfer to Toilet: with modified independence;ambulating Pt Will Perform Toileting - Clothing Manipulation and hygiene: with modified independence;sit to/from stand Pt Will Perform Tub/Shower Transfer: Shower transfer;with modified independence;3 in 1;ambulating;grab bars;rolling walker  OT Frequency: Min 2X/week   Barriers to D/C: Other (comment)  none known at this time          End of Session Equipment Utilized During Treatment: Rolling walker CPM Right Knee CPM Right Knee: On Right Knee Flexion (Degrees): 90 Right Knee Extension (Degrees): 0  Activity Tolerance: Patient tolerated treatment well Patient left: in bed;with call bell/phone within reach   Time: [id number] OT Time Calculation (min): 17 min Charges:  OT General Charges $OT Visit: 1 Procedure OT Evaluation $OT Eval Low Complexity: 1 Procedure G-Codes:    Pittman, Rilyn Upshaw L, MS, OTR/L 10/08/2015, 9:22 AM

## 2015-10-08 NOTE — Progress Notes (Signed)
Physical Therapy Treatment Patient Details Name: Kenneth Hester MRN: [id number] DOB: [birth year] Today's Date: 11-03-2015    History of Present Illness pt admitted for R TKA. PMHx: sleep apnea, obesity, GERD, HTN, asthma    PT Comments    Pt continues with exceptional progression performing long hall mobility and stairs without difficulty. Pt educated for HEP, bone foam, and gait with verbalized understanding. Pt safe for D/c home. Will follow acutely.   Follow Up Recommendations  Home health PT     Equipment Recommendations       Recommendations for Other Services       Precautions / Restrictions Precautions Precautions: Knee Restrictions RLE Weight Bearing: Weight bearing as tolerated    Mobility  Bed Mobility               General bed mobility comments: in chair on arrival  Transfers Overall transfer level: Modified independent                  Ambulation/Gait Ambulation/Gait assistance: Supervision Ambulation Distance (Feet): 300 Feet Assistive device: Rolling walker (2 wheeled) Gait Pattern/deviations: Step-through pattern;Decreased dorsiflexion - right   Gait velocity interpretation: Below normal speed for age/gender General Gait Details: cues for posture, increased dorsiflexion right and decreased weight bearing on upper body   Stairs Stairs: Yes Stairs assistance: Modified independent (Device/Increase time) Stair Management: One rail Left;Step to pattern;Forwards Number of Stairs: 11 General stair comments: cues for sequence with pt able to return demonstrate  Wheelchair Mobility    Modified Rankin (Stroke Patients Only)       Balance                                    Cognition Arousal/Alertness: Awake/alert Behavior During Therapy: WFL for tasks assessed/performed Overall Cognitive Status: Within Functional Limits for tasks assessed                      Exercises Total Joint Exercises Heel Slides:  AROM;Right;15 reps;Seated Straight Leg Raises: AROM;Seated;Right;15 reps Long Arc Quad: AROM;Seated;Right;15 reps Goniometric ROM: 2-90    General Comments        Pertinent Vitals/Pain Pain Score: 6  Pain Location: right knee Pain Descriptors / Indicators: Aching Pain Intervention(s): Limited activity within patient's tolerance;Monitored during session;Repositioned    Home Living                      Prior Function            PT Goals (current goals can now be found in the care plan section) Progress towards PT goals: Progressing toward goals    Frequency       PT Plan Current plan remains appropriate    Co-evaluation             End of Session Equipment Utilized During Treatment: Gait belt Activity Tolerance: Patient tolerated treatment well Patient left: in chair;with call bell/phone within reach (in bone foam)     Time: [id number] PT Time Calculation (min) (ACUTE ONLY): 20 min  Charges:  $Gait Training: 8-22 mins                    G Codes:      Melford Aase November 03, 2015, 7:40 AM Elwyn Reach, [place]

## 2015-10-08 NOTE — Care Management Note (Signed)
Case Management Note  Patient Details  Name: GALVIN MCCALLIE MRN: [id number] Date of Birth: 1951-01-09  Subjective/Objective:          S/p right total knee arthroplasty          Action/Plan: Set up with [facility] for HHPT by MD office. Spoke with patient and his wife, no change in discharge plan. [employer] has delivered CPM, rolling walker and 3N1 to home. Patient's wife will be able to assist after discharge. No other discharge needs identified. Expected Discharge Date:                  Expected Discharge Plan:  [place]  In-House Referral:  NA  Discharge planning Services  CM Consult  Post Acute Care Choice:  Durable Medical Equipment, Home Health Choice offered to:  Patient  DME Arranged:  3-N-1, Walker rolling, CPM DME Agency:  [employer]  HH Arranged:  PT HH Agency:  [place]  Status of Service:  Completed, signed off  Medicare Important Message Given:    Date Medicare IM Given:    Medicare IM give by:    Date Additional Medicare IM Given:    Additional Medicare Important Message give by:     If discussed at [place] of Stay Meetings, dates discussed:    Additional Comments:  Nila Nephew, RN 10/08/2015, 10:18 AM

## 2015-10-08 NOTE — Discharge Summary (Signed)
Patient ID: Kenneth Hester MRN: [id number] DOB/AGE: Jan 28, 1951 65 y.o.  Admit date: 10/07/2015 Discharge date: 10/08/2015  Admission Diagnoses:  Principal Problem:   Primary localized osteoarthritis of right knee Active Problems:   Sleep apnea   Morbid obesity ([place])   GERD (gastroesophageal reflux disease)   Asthma   Hypertension   Discharge Diagnoses:  Same  Past Medical History  Diagnosis Date  . Arthritis   . Primary localized osteoarthritis of right knee   . Hypertension   . Asthma   . Sleep apnea     wears CPAP nightly  . Family history of adverse reaction to anesthesia     Patients mother went into cardiac arrest twice during surgery  . Pneumonia     hx of  . Urgency of urination   . GERD (gastroesophageal reflux disease)     Surgeries: Procedure(s): TOTAL KNEE ARTHROPLASTY on 10/07/2015   Consultants:    Discharged Condition: Improved  Hospital Course: Kenneth Hester is an 65 y.o. male who was admitted 10/07/2015 for operative treatment ofPrimary localized osteoarthritis of right knee. Patient has severe unremitting pain that affects sleep, daily activities, and work/hobbies. After pre-op clearance the patient was taken to the operating room on 10/07/2015 and underwent  Procedure(s): TOTAL KNEE ARTHROPLASTY.    Patient was given perioperative antibiotics: Anti-infectives    Start     Dose/Rate Route Frequency Ordered Stop   10/07/15 1200  ceFAZolin (ANCEF) IVPB 2 g/50 mL premix     2 g 100 mL/hr over 30 Minutes Intravenous Every 6 hours 10/07/15 1100 10/07/15 1830   10/07/15 0700  ceFAZolin (ANCEF) 3 g in dextrose 5 % 50 mL IVPB     3 g 130 mL/hr over 30 Minutes Intravenous To [facility]Stay [facility] 10/04/15 1002 10/07/15 0731       Patient was given sequential compression devices, early ambulation, and chemoprophylaxis to prevent DVT.  Patient benefited maximally from hospital stay and there were no complications.    Recent vital signs: Patient  Vitals for the past 24 hrs:  BP Temp Temp src Pulse Resp SpO2  10/08/15 0500 (!) 147/67 mmHg 97.8 F (36.6 C) Oral 76 18 98 %  10/08/15 0100 (!) 132/104 mmHg 97.8 F (36.6 C) Oral 77 18 97 %  10/07/15 1900 (!) 128/57 mmHg 98.1 F (36.7 C) Oral 82 18 95 %  10/07/15 1634 (!) 145/70 mmHg 98.1 F (36.7 C) Axillary 76 18 96 %  10/07/15 1048 133/64 mmHg 97.8 F (36.6 C) Oral 72 18 96 %  10/07/15 1030 136/69 mmHg 97.8 F (36.6 C) - 69 18 97 %  10/07/15 1015 (!) 144/77 mmHg - - 70 15 96 %  10/07/15 1000 135/75 mmHg - - 72 14 97 %  10/07/15 0945 129/67 mmHg - - 73 19 96 %  10/07/15 0930 - - - 78 13 95 %  10/07/15 0926 (!) 108/58 mmHg 97.5 F (36.4 C) - 79 (!) 25 97 %     Recent laboratory studies: No results for input(s): WBC, HGB, HCT, PLT, NA, K, CL, CO2, BUN, CREATININE, GLUCOSE, INR, CALCIUM in the last 72 hours.  Invalid input(s): PT, 2   Discharge Medications:     Medication List    TAKE these medications        aspirin 325 MG EC tablet  1 tab a day for the next 30 days to prevent blood clots     celecoxib 200 MG capsule  Commonly known as:  CELEBREX  1  tab po q day with food for pain and  swelling     docusate sodium 100 MG capsule  Commonly known as:  COLACE  1 tab 2 times a day while on narcotics.  STOOL SOFTENER     FLOVENT HFA 110 MCG/ACT inhaler  Generic drug:  fluticasone  INHALE 2 PUFFS INTO THE LUNGS TWICE A DAY     hydrochlorothiazide 25 MG tablet  Commonly known as:  HYDRODIURIL  Take 1 tablet (25 mg total) by mouth daily.     multivitamin with minerals tablet  Take 1 tablet by mouth daily. Packet of varies vitamins     oxyCODONE 5 MG immediate release tablet  Commonly known as:  Oxy IR/ROXICODONE  1-2 tablets every 4-6 hrs as needed for pain     polyethylene glycol packet  Commonly known as:  MIRALAX / GLYCOLAX  17grams in 4 oz of water twice a day until bowel movement.  LAXITIVE.  Restart if two days since last bowel movement     PROAIR HFA 108  (90 Base) MCG/ACT inhaler  Generic drug:  albuterol  INHALE 2 SPRAYS EVERY 4 HOURS AS NEEDED     VIAGRA 100 MG tablet  Generic drug:  sildenafil  TAKE 1 TABLET BY MOUTH EVERY DAY        Diagnostic Studies: No results found.  Disposition: 01-Home or Self Care      Discharge Instructions    CPM    Complete by:  As directed   Continuous passive motion machine (CPM):      Use the CPM from 0 to 90 for 6 hours per day.       You may break it up into 2 or 3 sessions per day.      Use CPM for 2 weeks or until you are told to stop.     Call MD / Call 911    Complete by:  As directed   If you experience chest pain or shortness of breath, CALL 911 and be transported to the hospital emergency room.  If you develope a fever above 101 F, pus (white drainage) or increased drainage or redness at the wound, or calf pain, call your surgeon's office.     Change dressing    Complete by:  As directed   Change the gauze dressing daily with sterile 4 x 4 inch gauze and apply TED hose.  DO NOT REMOVE BANDAGE OVER SURGICAL INCISION.  [place] WHOLE LEG INCLUDING OVER THE WATERPROOF BANDAGE WITH SOAP AND WATER EVERY DAY.     Constipation Prevention    Complete by:  As directed   Drink plenty of fluids.  Prune juice may be helpful.  You may use a stool softener, such as Colace (over the counter) 100 mg twice a day.  Use MiraLax (over the counter) for constipation as needed.     Diet - low sodium heart healthy    Complete by:  As directed      Discharge instructions    Complete by:  As directed   INSTRUCTIONS AFTER JOINT REPLACEMENT   Remove items at home which could result in a fall. This includes throw rugs or furniture in walking pathways ICE to the affected joint every three hours while awake for 30 minutes at a time, for at least the first 3-5 days, and then as needed for pain and swelling.  Continue to use ice for pain and swelling. You may notice swelling that will progress down to the foot  and ankle.   This is normal after surgery.  Elevate your leg when you are not up walking on it.   Continue to use the breathing machine you got in the hospital (incentive spirometer) which will help keep your temperature down.  It is common for your temperature to cycle up and down following surgery, especially at night when you are not up moving around and exerting yourself.  The breathing machine keeps your lungs expanded and your temperature down.   DIET:  As you were doing prior to hospitalization, we recommend a well-balanced diet.  DRESSING / WOUND CARE / SHOWERING  Keep the surgical dressing until follow up.  The dressing is water proof, so you can shower without any extra covering.  IF THE DRESSING FALLS OFF or the wound gets wet inside, change the dressing with sterile gauze.  Please use good hand washing techniques before changing the dressing.  Do not use any lotions or creams on the incision until instructed by your surgeon.    ACTIVITY  Increase activity slowly as tolerated, but follow the weight bearing instructions below.   No driving for 6 weeks or until further direction given by your physician.  You cannot drive while taking narcotics.  No lifting or carrying greater than 10 lbs. until further directed by your surgeon. Avoid periods of inactivity such as sitting longer than an hour when not asleep. This helps prevent blood clots.  You may return to work once you are authorized by your doctor.     WEIGHT BEARING   Weight bearing as tolerated with assist device (walker, cane, etc) as directed, use it as long as suggested by your surgeon or therapist, typically at least 2-3 weeks.   EXERCISES  Results after joint replacement surgery are often greatly improved when you follow the exercise, range of motion and muscle strengthening exercises prescribed by your doctor. Safety measures are also important to protect the joint from further injury. Any time any of these exercises cause you to  have increased pain or swelling, decrease what you are doing until you are comfortable again and then slowly increase them. If you have problems or questions, call your caregiver or physical therapist for advice.   Rehabilitation is important following a joint replacement. After just a few days of immobilization, the muscles of the leg can become weakened and shrink (atrophy).  These exercises are designed to build up the tone and strength of the thigh and leg muscles and to improve motion. Often times heat used for twenty to thirty minutes before working out will loosen up your tissues and help with improving the range of motion but do not use heat for the first two weeks following surgery (sometimes heat can increase post-operative swelling).   These exercises can be done on a training (exercise) mat, on the floor, on a table or on a bed. Use whatever works the best and is most comfortable for you.    Use music or television while you are exercising so that the exercises are a pleasant break in your day. This will make your life better with the exercises acting as a break in your routine that you can look forward to.   Perform all exercises about fifteen times, three times per day or as directed.  You should exercise both the operative leg and the other leg as well.   Exercises include:   Quad Sets - Tighten up the muscle on the front of the thigh [employer]) and hold for  5-10 seconds.   Straight Leg Raises - With your knee straight (if you were given a brace, keep it on), lift the leg to 60 degrees, hold for 3 seconds, and slowly lower the leg.  Perform this exercise against resistance later as your leg gets stronger.  Leg Slides: Lying on your back, slowly slide your foot toward your buttocks, bending your knee up off the floor (only go as far as is comfortable). Then slowly slide your foot back down until your leg is flat on the floor again.  Angel Wings: Lying on your back spread your legs to the side as  far apart as you can without causing discomfort.  Hamstring Strength:  Lying on your back, push your heel against the floor with your leg straight by tightening up the muscles of your buttocks.  Repeat, but this time bend your knee to a comfortable angle, and push your heel against the floor.  You may put a pillow under the heel to make it more comfortable if necessary.   A rehabilitation program following joint replacement surgery can speed recovery and prevent re-injury in the future due to weakened muscles. Contact your doctor or a physical therapist for more information on knee rehabilitation.    CONSTIPATION  Constipation is defined medically as fewer than three stools per week and severe constipation as less than one stool per week.  Even if you have a regular bowel pattern at home, your normal regimen is likely to be disrupted due to multiple reasons following surgery.  Combination of anesthesia, postoperative narcotics, change in appetite and fluid intake all can affect your bowels.   YOU MUST use at least one of the following options; they are listed in order of increasing strength to get the job done.  They are all available over the counter, and you may need to use some, POSSIBLY even all of these options:    Drink plenty of fluids (prune juice may be helpful) and high fiber foods Colace 100 mg by mouth twice a day  Senokot for constipation as directed and as needed Dulcolax (bisacodyl), take with full glass of water  Miralax (polyethylene glycol) once or twice a day as needed.  If you have tried all these things and are unable to have a bowel movement in the first 3-4 days after surgery call either your surgeon or your primary doctor.    If you experience loose stools or diarrhea, hold the medications until you stool forms back up.  If your symptoms do not get better within 1 week or if they get worse, check with your doctor.  If you experience "the worst abdominal pain ever" or  develop nausea or vomiting, please contact the office immediately for further recommendations for treatment.   ITCHING:  If you experience itching with your medications, try taking only a single pain pill, or even half a pain pill at a time.  You can also use Benadryl over the counter for itching or also to help with sleep.   TED HOSE STOCKINGS:  Use stockings on both legs until for at least 2 weeks or as directed by physician office. They may be removed at night for sleeping.  MEDICATIONS:  See your medication summary on the "After Visit Summary" that nursing will review with you.  You may have some home medications which will be placed on hold until you complete the course of blood thinner medication.  It is important for you to complete the blood thinner medication as  prescribed.  PRECAUTIONS:  If you experience chest pain or shortness of breath - call 911 immediately for transfer to the hospital emergency department.   If you develop a fever greater that 101 F, purulent drainage from wound, increased redness or drainage from wound, foul odor from the wound/dressing, or calf pain - CONTACT YOUR SURGEON.                                                   FOLLOW-UP APPOINTMENTS:  If you do not already have a post-op appointment, please call the office for an appointment to be seen by your surgeon.  Guidelines for how soon to be seen are listed in your "After Visit Summary", but are typically between 1-4 weeks after surgery.  OTHER INSTRUCTIONS:   Knee Replacement:  Do not place pillow under knee, focus on keeping the knee straight while resting. CPM instructions: 0-90 degrees, 2 hours in the morning, 2 hours in the afternoon, and 2 hours in the evening. Place foam block, curve side up under heel at all times except when in CPM or when walking.  DO NOT modify, tear, cut, or change the foam block in any way.  MAKE SURE YOU:  Understand these instructions.  Get help right away if you are not doing  well or get worse.    Thank you for letting us be a part of your medical care team.  It is a privilege we respect greatly.  We hope these instructions will help you stay on track for a fast and full recovery!     Do not put a pillow under the knee. Place it under the heel.    Complete by:  As directed   Place gray foam block, curve side up under heel at all times except when in CPM or when walking.  DO NOT modify, tear, cut, or change in any way the gray foam block.     Increase activity slowly as tolerated    Complete by:  As directed      TED hose    Complete by:  As directed   Use stockings (TED hose) for 2 weeks on both leg(s).  You may remove them at night for sleeping.           Follow-up Information    Follow up with Lorn Junes, MD On 10/21/2015.   Specialty:  Orthopedic Surgery   Why:  appt time 2 pm   Contact information:   [address] [place] [address] [phone]        Signed: JSON, GERSH 10/08/2015, 7:52 AM

## 2015-10-08 NOTE — Progress Notes (Signed)
Physical Therapy Treatment Patient Details Name: Kenneth Hester MRN: [id number] DOB: 03-19-1951 Today's Date: October 22, 2015    History of Present Illness pt admitted for R TKA. PMHx: sleep apnea, obesity, GERD, HTN, asthma    PT Comments    Pt continues to ambulate and move well. All education completed  Follow Up Recommendations  Home health PT     Equipment Recommendations       Recommendations for Other Services       Precautions / Restrictions Precautions Precautions: Knee Restrictions Weight Bearing Restrictions: Yes RLE Weight Bearing: Weight bearing as tolerated    Mobility  Bed Mobility Overal bed mobility: Modified Independent               Transfers Overall transfer level: Modified independent  Transfers: Sit to/from Stand Sit to Stand: Modified independent (Device/Increase time)          Ambulation/Gait Ambulation/Gait assistance: Supervision Ambulation Distance (Feet): 500 Feet Assistive device: Rolling walker (2 wheeled) Gait Pattern/deviations: Step-through pattern;Decreased dorsiflexion - right   Gait velocity interpretation: Below normal speed for age/gender General Gait Details: cues for increased dorsiflexion     Wheelchair Mobility    Modified Rankin (Stroke Patients Only)       Balance                                    Cognition Arousal/Alertness: Awake/alert Behavior During Therapy: WFL for tasks assessed/performed Overall Cognitive Status: Within Functional Limits for tasks assessed                      Exercises Total Joint Exercises Heel Slides: AROM;Right;Seated;20 reps Hip ABduction/ADduction: AROM;Right;20 reps;Supine Straight Leg Raises: AROM;Seated;Right;20 reps Long Arc Quad: AROM;Seated;Right;20 reps Goniometric ROM: 2-90    General Comments        Pertinent Vitals/Pain Pain Assessment: 0-10 Pain Score: 5  Pain Location: R knee Pain Descriptors / Indicators:  Aching;Sore Pain Intervention(s): Limited activity within patient's tolerance;Monitored during session;Repositioned    Home Living Family/patient expects to be discharged to:: Private residence Living Arrangements: Spouse/significant other Available Help at Discharge: Family;Available 24 hours/day Type of Home: House Home Access: Stairs to enter Entrance Stairs-Rails: Left Home Layout: One level Home Equipment: Walker - 2 wheels;Cane - single point;Bedside commode      Prior Function Level of Independence: Independent          PT Goals (current goals can now be found in the care plan section) Acute Rehab PT Goals Patient Stated Goal: get back home and to work Progress towards PT goals: Progressing toward goals    Frequency       PT Plan Current plan remains appropriate    Co-evaluation             End of Session Equipment Utilized During Treatment: Gait belt Activity Tolerance: Patient tolerated treatment well Patient left: in bed;with family/visitor present     Time: 1050-1107 PT Time Calculation (min) (ACUTE ONLY): 17 min  Charges:  $Gait Training: 8-22 mins                    G Codes:      Melford Aase 10/22/15, 11:14 AM Elwyn Reach, [place]

## 2015-10-09 DIAGNOSIS — Z96651 Presence of right artificial knee joint: Secondary | ICD-10-CM | POA: Diagnosis not present

## 2015-10-09 DIAGNOSIS — Z6837 Body mass index (BMI) 37.0-37.9, adult: Secondary | ICD-10-CM | POA: Diagnosis not present

## 2015-10-09 DIAGNOSIS — Z9181 History of falling: Secondary | ICD-10-CM | POA: Diagnosis not present

## 2015-10-09 DIAGNOSIS — Z471 Aftercare following joint replacement surgery: Secondary | ICD-10-CM | POA: Diagnosis not present

## 2015-10-09 DIAGNOSIS — Z7982 Long term (current) use of aspirin: Secondary | ICD-10-CM | POA: Diagnosis not present

## 2015-10-09 DIAGNOSIS — I10 Essential (primary) hypertension: Secondary | ICD-10-CM | POA: Diagnosis not present

## 2015-10-09 DIAGNOSIS — Z79891 Long term (current) use of opiate analgesic: Secondary | ICD-10-CM | POA: Diagnosis not present

## 2015-10-09 DIAGNOSIS — J45909 Unspecified asthma, uncomplicated: Secondary | ICD-10-CM | POA: Diagnosis not present

## 2015-10-09 DIAGNOSIS — Z87891 Personal history of nicotine dependence: Secondary | ICD-10-CM | POA: Diagnosis not present

## 2015-10-14 DIAGNOSIS — Z96651 Presence of right artificial knee joint: Secondary | ICD-10-CM | POA: Diagnosis not present

## 2015-10-14 DIAGNOSIS — Z9181 History of falling: Secondary | ICD-10-CM | POA: Diagnosis not present

## 2015-10-14 DIAGNOSIS — Z471 Aftercare following joint replacement surgery: Secondary | ICD-10-CM | POA: Diagnosis not present

## 2015-10-14 DIAGNOSIS — I10 Essential (primary) hypertension: Secondary | ICD-10-CM | POA: Diagnosis not present

## 2015-10-14 DIAGNOSIS — Z87891 Personal history of nicotine dependence: Secondary | ICD-10-CM | POA: Diagnosis not present

## 2015-10-14 DIAGNOSIS — Z7982 Long term (current) use of aspirin: Secondary | ICD-10-CM | POA: Diagnosis not present

## 2015-10-14 DIAGNOSIS — Z6837 Body mass index (BMI) 37.0-37.9, adult: Secondary | ICD-10-CM | POA: Diagnosis not present

## 2015-10-14 DIAGNOSIS — Z79891 Long term (current) use of opiate analgesic: Secondary | ICD-10-CM | POA: Diagnosis not present

## 2015-10-14 DIAGNOSIS — J45909 Unspecified asthma, uncomplicated: Secondary | ICD-10-CM | POA: Diagnosis not present

## 2015-10-16 ENCOUNTER — Ambulatory Visit: Payer: Self-pay | Admitting: General Surgery

## 2015-10-21 DIAGNOSIS — Z96651 Presence of right artificial knee joint: Secondary | ICD-10-CM | POA: Diagnosis not present

## 2015-10-23 DIAGNOSIS — Z96651 Presence of right artificial knee joint: Secondary | ICD-10-CM | POA: Diagnosis not present

## 2015-10-23 DIAGNOSIS — M1711 Unilateral primary osteoarthritis, right knee: Secondary | ICD-10-CM | POA: Diagnosis not present

## 2015-10-25 DIAGNOSIS — G4733 Obstructive sleep apnea (adult) (pediatric): Secondary | ICD-10-CM | POA: Diagnosis not present

## 2015-10-28 DIAGNOSIS — M6281 Muscle weakness (generalized): Secondary | ICD-10-CM | POA: Diagnosis not present

## 2015-10-28 DIAGNOSIS — M25661 Stiffness of right knee, not elsewhere classified: Secondary | ICD-10-CM | POA: Diagnosis not present

## 2015-10-28 DIAGNOSIS — M25561 Pain in right knee: Secondary | ICD-10-CM | POA: Diagnosis not present

## 2015-10-28 DIAGNOSIS — R262 Difficulty in walking, not elsewhere classified: Secondary | ICD-10-CM | POA: Diagnosis not present

## 2015-10-30 DIAGNOSIS — Z96651 Presence of right artificial knee joint: Secondary | ICD-10-CM | POA: Diagnosis not present

## 2015-10-31 DIAGNOSIS — R262 Difficulty in walking, not elsewhere classified: Secondary | ICD-10-CM | POA: Diagnosis not present

## 2015-10-31 DIAGNOSIS — M25561 Pain in right knee: Secondary | ICD-10-CM | POA: Diagnosis not present

## 2015-10-31 DIAGNOSIS — M6281 Muscle weakness (generalized): Secondary | ICD-10-CM | POA: Diagnosis not present

## 2015-10-31 DIAGNOSIS — M25661 Stiffness of right knee, not elsewhere classified: Secondary | ICD-10-CM | POA: Diagnosis not present

## 2015-11-05 DIAGNOSIS — R262 Difficulty in walking, not elsewhere classified: Secondary | ICD-10-CM | POA: Diagnosis not present

## 2015-11-05 DIAGNOSIS — M6281 Muscle weakness (generalized): Secondary | ICD-10-CM | POA: Diagnosis not present

## 2015-11-05 DIAGNOSIS — M25561 Pain in right knee: Secondary | ICD-10-CM | POA: Diagnosis not present

## 2015-11-05 DIAGNOSIS — M25661 Stiffness of right knee, not elsewhere classified: Secondary | ICD-10-CM | POA: Diagnosis not present

## 2015-11-07 DIAGNOSIS — R262 Difficulty in walking, not elsewhere classified: Secondary | ICD-10-CM | POA: Diagnosis not present

## 2015-11-07 DIAGNOSIS — M25661 Stiffness of right knee, not elsewhere classified: Secondary | ICD-10-CM | POA: Diagnosis not present

## 2015-11-07 DIAGNOSIS — M25561 Pain in right knee: Secondary | ICD-10-CM | POA: Diagnosis not present

## 2015-11-07 DIAGNOSIS — M6281 Muscle weakness (generalized): Secondary | ICD-10-CM | POA: Diagnosis not present

## 2015-11-12 DIAGNOSIS — R262 Difficulty in walking, not elsewhere classified: Secondary | ICD-10-CM | POA: Diagnosis not present

## 2015-11-12 DIAGNOSIS — M25561 Pain in right knee: Secondary | ICD-10-CM | POA: Diagnosis not present

## 2015-11-12 DIAGNOSIS — M25661 Stiffness of right knee, not elsewhere classified: Secondary | ICD-10-CM | POA: Diagnosis not present

## 2015-11-12 DIAGNOSIS — M6281 Muscle weakness (generalized): Secondary | ICD-10-CM | POA: Diagnosis not present

## 2015-11-14 DIAGNOSIS — M25661 Stiffness of right knee, not elsewhere classified: Secondary | ICD-10-CM | POA: Diagnosis not present

## 2015-11-14 DIAGNOSIS — M25561 Pain in right knee: Secondary | ICD-10-CM | POA: Diagnosis not present

## 2015-11-14 DIAGNOSIS — R262 Difficulty in walking, not elsewhere classified: Secondary | ICD-10-CM | POA: Diagnosis not present

## 2015-11-14 DIAGNOSIS — M6281 Muscle weakness (generalized): Secondary | ICD-10-CM | POA: Diagnosis not present

## 2015-11-19 DIAGNOSIS — R262 Difficulty in walking, not elsewhere classified: Secondary | ICD-10-CM | POA: Diagnosis not present

## 2015-11-19 DIAGNOSIS — M25661 Stiffness of right knee, not elsewhere classified: Secondary | ICD-10-CM | POA: Diagnosis not present

## 2015-11-19 DIAGNOSIS — M25561 Pain in right knee: Secondary | ICD-10-CM | POA: Diagnosis not present

## 2015-11-19 DIAGNOSIS — M6281 Muscle weakness (generalized): Secondary | ICD-10-CM | POA: Diagnosis not present

## 2015-11-20 ENCOUNTER — Ambulatory Visit
Admission: RE | Admit: 2015-11-20 | Discharge: 2015-11-20 | Disposition: A | Discharge status: Home / Self Care | Payer: PPO | Source: Ambulatory Visit | Attending: Orthopedic Surgery | Admitting: Orthopedic Surgery

## 2015-11-20 ENCOUNTER — Other Ambulatory Visit: Payer: Self-pay | Admitting: Orthopedic Surgery

## 2015-11-20 DIAGNOSIS — Z96651 Presence of right artificial knee joint: Secondary | ICD-10-CM | POA: Diagnosis not present

## 2015-11-20 DIAGNOSIS — R609 Edema, unspecified: Secondary | ICD-10-CM

## 2015-11-20 DIAGNOSIS — M25561 Pain in right knee: Secondary | ICD-10-CM

## 2015-11-20 DIAGNOSIS — M79604 Pain in right leg: Secondary | ICD-10-CM | POA: Insufficient documentation

## 2015-11-20 DIAGNOSIS — M79661 Pain in right lower leg: Secondary | ICD-10-CM | POA: Diagnosis not present

## 2015-11-21 DIAGNOSIS — M25561 Pain in right knee: Secondary | ICD-10-CM | POA: Diagnosis not present

## 2015-11-21 DIAGNOSIS — M25661 Stiffness of right knee, not elsewhere classified: Secondary | ICD-10-CM | POA: Diagnosis not present

## 2015-11-21 DIAGNOSIS — M6281 Muscle weakness (generalized): Secondary | ICD-10-CM | POA: Diagnosis not present

## 2015-11-21 DIAGNOSIS — R262 Difficulty in walking, not elsewhere classified: Secondary | ICD-10-CM | POA: Diagnosis not present

## 2015-11-25 DIAGNOSIS — M6281 Muscle weakness (generalized): Secondary | ICD-10-CM | POA: Diagnosis not present

## 2015-11-25 DIAGNOSIS — M25561 Pain in right knee: Secondary | ICD-10-CM | POA: Diagnosis not present

## 2015-11-25 DIAGNOSIS — R262 Difficulty in walking, not elsewhere classified: Secondary | ICD-10-CM | POA: Diagnosis not present

## 2015-11-25 DIAGNOSIS — M25661 Stiffness of right knee, not elsewhere classified: Secondary | ICD-10-CM | POA: Diagnosis not present

## 2015-11-28 DIAGNOSIS — R262 Difficulty in walking, not elsewhere classified: Secondary | ICD-10-CM | POA: Diagnosis not present

## 2015-11-28 DIAGNOSIS — M25661 Stiffness of right knee, not elsewhere classified: Secondary | ICD-10-CM | POA: Diagnosis not present

## 2015-11-28 DIAGNOSIS — M25561 Pain in right knee: Secondary | ICD-10-CM | POA: Diagnosis not present

## 2015-11-28 DIAGNOSIS — M6281 Muscle weakness (generalized): Secondary | ICD-10-CM | POA: Diagnosis not present

## 2015-12-03 ENCOUNTER — Encounter: Payer: Self-pay | Admitting: General Surgery

## 2015-12-03 ENCOUNTER — Ambulatory Visit (INDEPENDENT_AMBULATORY_CARE_PROVIDER_SITE_OTHER): Payer: PPO | Admitting: General Surgery

## 2015-12-03 VITALS — BP 134/70 | HR 74 | Resp 14 | Ht 70.0 in | Wt 253.0 lb

## 2015-12-03 DIAGNOSIS — M6281 Muscle weakness (generalized): Secondary | ICD-10-CM | POA: Diagnosis not present

## 2015-12-03 DIAGNOSIS — M25561 Pain in right knee: Secondary | ICD-10-CM | POA: Diagnosis not present

## 2015-12-03 DIAGNOSIS — Z1211 Encounter for screening for malignant neoplasm of colon: Secondary | ICD-10-CM | POA: Insufficient documentation

## 2015-12-03 DIAGNOSIS — R262 Difficulty in walking, not elsewhere classified: Secondary | ICD-10-CM | POA: Diagnosis not present

## 2015-12-03 DIAGNOSIS — M25661 Stiffness of right knee, not elsewhere classified: Secondary | ICD-10-CM | POA: Diagnosis not present

## 2015-12-03 MED ORDER — AMOXICILLIN 500 MG PO CAPS: 500.0000 mg | ORAL_CAPSULE | Freq: Three times a day (TID) | ORAL | Status: DC

## 2015-12-03 NOTE — Patient Instructions (Addendum)
The patient is aware to call back for any questions or concerns.  Colonoscopy A colonoscopy is an exam to look at the entire large intestine (colon). This exam can help find problems such as tumors, polyps, inflammation, and areas of bleeding. The exam takes about 1 hour.  LET YOUR HEALTH CARE PROVIDER KNOW ABOUT:   Any allergies you have.  All medicines you are taking, including vitamins, herbs, eye drops, creams, and over-the-counter medicines.  Previous problems you or members of your family have had with the use of anesthetics.  Any blood disorders you have.  Previous surgeries you have had.  Medical conditions you have. RISKS AND COMPLICATIONS  Generally, this is a safe procedure. However, as with any procedure, complications can occur. Possible complications include:  Bleeding.  Tearing or rupture of the colon wall.  Reaction to medicines given during the exam.  Infection (rare). BEFORE THE PROCEDURE   Ask your health care provider about changing or stopping your regular medicines.  You may be prescribed an oral bowel prep. This involves drinking a large amount of medicated liquid, starting the day before your procedure. The liquid will cause you to have multiple loose stools until your stool is almost clear or light green. This cleans out your colon in preparation for the procedure.  Do not eat or drink anything else once you have started the bowel prep, unless your health care provider tells you it is safe to do so.  Arrange for someone to drive you home after the procedure. PROCEDURE   You will be given medicine to help you relax (sedative).  You will lie on your side with your knees bent.  A long, flexible tube with a light and camera on the end (colonoscope) will be inserted through the rectum and into the colon. The camera sends video back to a computer screen as it moves through the colon. The colonoscope also releases carbon dioxide gas to inflate the colon.  This helps your health care provider see the area better.  During the exam, your health care provider may take a small tissue sample (biopsy) to be examined under a microscope if any abnormalities are found.  The exam is finished when the entire colon has been viewed. AFTER THE PROCEDURE   Do not drive for 24 hours after the exam.  You may have a small amount of blood in your stool.  You may pass moderate amounts of gas and have mild abdominal cramping or bloating. This is caused by the gas used to inflate your colon during the exam.  Ask when your test results will be ready and how you will get your results. Make sure you get your test results.   This information is not intended to replace advice given to you by your health care provider. Make sure you discuss any questions you have with your health care provider.   Document Released: 08/07/2000 Document Revised: 05/31/2013 Document Reviewed: 04/17/2013 [employer] Interactive Patient Education 2016 [employer]  

## 2015-12-03 NOTE — Progress Notes (Signed)
Patient ID: Kenneth Hester, male   DOB: 11/14/1950, 65 y.o.   MRN: [id number]  Chief Complaint  Patient presents with  . Colonoscopy    HPI Kenneth Hester is a 65 y.o. male.  Here today to discuss having a colonoscopy. Last completed 2007. Denies any gastrointestinal issues. Bowels move regular and daily.  The patient underwent a right total knee replacement in February 2017. He is had difficulty with swelling and is presently on a course of prednisone. He reports an ultrasound was completed last week that did not show evidence of DVT.  The patient reports that he received 2 courses of oral antibiotics for cellulitis involving the right lower extremity.    HPI  Past Medical History  Diagnosis Date  . Arthritis   . Primary localized osteoarthritis of right knee   . Hypertension   . Asthma   . Sleep apnea     wears CPAP nightly  . Family history of adverse reaction to anesthesia     Patients mother went into cardiac arrest twice during surgery  . Pneumonia     hx of  . Urgency of urination   . GERD (gastroesophageal reflux disease)     Past Surgical History  Procedure Laterality Date  . Appendectomy    . Hydrocelectomy    . Skin split graft    . Knee surgery Right   . Skin graft    . Hernia repair      X 2  . Colonoscopy w/ polypectomy    . Total knee arthroplasty Right 10/07/2015    Procedure: TOTAL KNEE ARTHROPLASTY;  Surgeon: Elsie Saas, MD;  Location: [place];  Service: Orthopedics;  Laterality: Right;  . Colonoscopy  2007    Family History  Problem Relation Age of Onset  . Heart attack Mother   . Heart failure Mother   . Diabetes Mother   . Hypertension Mother   . Heart disease Mother   . Heart attack Sister   . Heart disease Sister   . Diabetes Sister   . Heart disease Father   . Cancer Father   . Diabetes Son   . Heart attack Maternal Grandfather   . Heart disease Son     Social History Social History  Substance Use Topics  . Smoking status:  Former Research scientist (life sciences)  . Smokeless tobacco: Never Used  . Alcohol Use: No    Allergies  Allergen Reactions  . Lisinopril Cough    cough, voice was affected and had hard time singing  . Achromycin [Tetracycline] Rash    Current Outpatient Prescriptions  Medication Sig Dispense Refill  . aspirin EC 325 MG EC tablet 1 tab a day for the next 30 days to prevent blood clots 30 tablet 0  . docusate sodium (COLACE) 100 MG capsule 1 tab 2 times a day while on narcotics.  STOOL SOFTENER 60 capsule 0  . FLOVENT HFA 110 MCG/ACT inhaler INHALE 2 PUFFS INTO THE LUNGS TWICE A DAY 12 Inhaler 0  . hydrochlorothiazide (HYDRODIURIL) 25 MG tablet Take 1 tablet (25 mg total) by mouth daily. 90 tablet 3  . Multiple Vitamins-Minerals (MULTIVITAMIN WITH MINERALS) tablet Take 1 tablet by mouth daily. Packet of varies vitamins    . oxyCODONE (OXY IR/ROXICODONE) 5 MG immediate release tablet 1-2 tablets every 4-6 hrs as needed for pain 100 tablet 0  . polyethylene glycol (MIRALAX / GLYCOLAX) packet 17grams in 4 oz of water twice a day until bowel movement.  LAXITIVE.  Restart  if two days since last bowel movement 14 each 0  . predniSONE (DELTASONE) 5 MG tablet     . PROAIR HFA 108 (90 BASE) MCG/ACT inhaler INHALE 2 SPRAYS EVERY 4 HOURS AS NEEDED 8.5 Inhaler 12  . VIAGRA 100 MG tablet TAKE 1 TABLET BY MOUTH EVERY DAY 10 tablet 12  . amoxicillin (AMOXIL) 500 MG capsule Take 1 capsule (500 mg total) by mouth 3 (three) times daily. 4 capsule 0  . celecoxib (CELEBREX) 200 MG capsule 1 tab po q day with food for pain and  swelling (Patient not taking: Reported on 12/03/2015) 30 capsule 3   No current facility-administered medications for this visit.    Review of Systems Review of Systems  Constitutional: Negative.   Respiratory: Negative.   Cardiovascular: Negative.     Blood pressure 134/70, pulse 74, resp. rate 14, height 5\' 10"  (1.778 m), weight 253 lb (114.76 kg).  Physical Exam Physical Exam  Constitutional: He  is oriented to person, place, and time. He appears well-developed and well-nourished.  Eyes: Conjunctivae are normal. No scleral icterus.  Neck: Neck supple.  Cardiovascular: Normal rate, regular rhythm and normal heart sounds.   Pulmonary/Chest: Effort normal and breath sounds normal.  Abdominal: Soft.  Musculoskeletal:       Legs: Lymphadenopathy:    He has no cervical adenopathy.  Neurological: He is alert and oriented to person, place, and time.  Skin: Skin is warm and dry.    Data Reviewed 2007 colonoscopy showed several benign-appearing polyps in the rectum. Cold biopsy showed hyperplastic changes.    Assessment    Candidate for screening colonoscopy.  Recent right total knee replacement with postoperative edema and cellulitis reported (resolved on oral antibiotics and steroids.    Plan    Will make use of amoxicillin, 500 mg capsules, #4 by mouth one hour prior to presentation for the procedure to minimize the risk of joint seeding.    Colonoscopy with possible biopsy/polypectomy prn: Information regarding the procedure, including its potential risks and complications (including but not limited to perforation of the bowel, which may require emergency surgery to repair, and bleeding) was verbally given to the patient. Educational information regarding lower intestinal endoscopy was given to the patient. Written instructions for how to complete the bowel prep using Miralax were provided. The importance of drinking ample fluids to avoid dehydration as a result of the prep emphasized.  The patient will be contacted tomorrow by the scheduling staff to arrange for this procedure to be completed a convenient date.  PCP:  Kenneth Hester This information has been scribed by Karie Fetch RN, BSN,BC.   Kenneth Hester 12/03/2015, 8:53 PM

## 2015-12-04 ENCOUNTER — Ambulatory Visit: Payer: PPO | Admitting: Family Medicine

## 2015-12-05 ENCOUNTER — Telehealth: Payer: Self-pay | Admitting: *Deleted

## 2015-12-05 DIAGNOSIS — M6281 Muscle weakness (generalized): Secondary | ICD-10-CM | POA: Diagnosis not present

## 2015-12-05 DIAGNOSIS — R262 Difficulty in walking, not elsewhere classified: Secondary | ICD-10-CM | POA: Diagnosis not present

## 2015-12-05 DIAGNOSIS — M25561 Pain in right knee: Secondary | ICD-10-CM | POA: Diagnosis not present

## 2015-12-05 DIAGNOSIS — M25661 Stiffness of right knee, not elsewhere classified: Secondary | ICD-10-CM | POA: Diagnosis not present

## 2015-12-05 NOTE — Telephone Encounter (Signed)
Message left on cell number for patient to call the office.   We need to get patient scheduled for a colonoscopy. Miralax prescription will be sent in at that time. We also need to confirm that patient received a copy of colonoscopy instructions.

## 2015-12-10 DIAGNOSIS — M25661 Stiffness of right knee, not elsewhere classified: Secondary | ICD-10-CM | POA: Diagnosis not present

## 2015-12-10 DIAGNOSIS — M25561 Pain in right knee: Secondary | ICD-10-CM | POA: Diagnosis not present

## 2015-12-10 DIAGNOSIS — M6281 Muscle weakness (generalized): Secondary | ICD-10-CM | POA: Diagnosis not present

## 2015-12-10 DIAGNOSIS — R262 Difficulty in walking, not elsewhere classified: Secondary | ICD-10-CM | POA: Diagnosis not present

## 2015-12-11 DIAGNOSIS — Z96651 Presence of right artificial knee joint: Secondary | ICD-10-CM | POA: Diagnosis not present

## 2015-12-12 DIAGNOSIS — M25561 Pain in right knee: Secondary | ICD-10-CM | POA: Diagnosis not present

## 2015-12-12 DIAGNOSIS — R262 Difficulty in walking, not elsewhere classified: Secondary | ICD-10-CM | POA: Diagnosis not present

## 2015-12-12 DIAGNOSIS — M25661 Stiffness of right knee, not elsewhere classified: Secondary | ICD-10-CM | POA: Diagnosis not present

## 2015-12-12 DIAGNOSIS — M6281 Muscle weakness (generalized): Secondary | ICD-10-CM | POA: Diagnosis not present

## 2015-12-12 NOTE — Telephone Encounter (Signed)
Another message left on cell number for patient to call the office.

## 2015-12-16 ENCOUNTER — Ambulatory Visit (INDEPENDENT_AMBULATORY_CARE_PROVIDER_SITE_OTHER): Payer: PPO | Admitting: Family Medicine

## 2015-12-16 VITALS — BP 142/70 | HR 72 | Temp 97.9°F | Resp 16 | Wt 257.0 lb

## 2015-12-16 DIAGNOSIS — E785 Hyperlipidemia, unspecified: Secondary | ICD-10-CM | POA: Diagnosis not present

## 2015-12-16 DIAGNOSIS — I10 Essential (primary) hypertension: Secondary | ICD-10-CM

## 2015-12-16 DIAGNOSIS — N529 Male erectile dysfunction, unspecified: Secondary | ICD-10-CM | POA: Diagnosis not present

## 2015-12-16 MED ORDER — SILDENAFIL CITRATE 20 MG PO TABS: 20.0000 mg | ORAL_TABLET | Freq: Two times a day (BID) | ORAL | Status: DC

## 2015-12-16 NOTE — Progress Notes (Signed)
Patient ID: Kenneth Hester, male   DOB: 05/29/1951, 65 y.o.   MRN: [id number]   [name]  MRN: [id number] DOB: 01-23-1951  Subjective:  HPI   1. Essential hypertension The patient is a 65 year old male who presents for follow up of hypertension.  He is on HCTZ.  He states he only checks his BP outside of the office when he feels bad.  He reports when he does check it he gets readings ranging 120s-130s over 60-70s.    Patient would also like to try getting a prescription of generic Viagra while he is here for ED.it has helped in the past.    Patient Active Problem List   Diagnosis Date Noted  . Encounter for screening colonoscopy 12/03/2015  . Primary localized osteoarthritis of right knee   . Hypertension   . Chest pain 12/05/2013  . Sleep apnea 12/05/2013  . Morbid obesity ([place]) 12/05/2013  . GERD (gastroesophageal reflux disease) 12/05/2013  . Asthma 12/05/2013    Past Medical History  Diagnosis Date  . Arthritis   . Primary localized osteoarthritis of right knee   . Hypertension   . Asthma   . Sleep apnea     wears CPAP nightly  . Family history of adverse reaction to anesthesia     Patients mother went into cardiac arrest twice during surgery  . Pneumonia     hx of  . Urgency of urination   . GERD (gastroesophageal reflux disease)     Social History   Social History  . Marital Status: Married    Spouse Name: Pamala Hurry  . Number of Children: N/A  . Years of Education: N/A   Occupational History  . Not on file.   Social History Main Topics  . Smoking status: Former Research scientist (life sciences)  . Smokeless tobacco: Never Used  . Alcohol Use: No  . Drug Use: No  . Sexual Activity: Yes   Other Topics Concern  . Not on file   Social History Narrative    Outpatient Prescriptions Prior to Visit  Medication Sig Dispense Refill  . FLOVENT HFA 110 MCG/ACT inhaler INHALE 2 PUFFS INTO THE LUNGS TWICE A DAY 12 Inhaler 0  . hydrochlorothiazide (HYDRODIURIL) 25 MG tablet  Take 1 tablet (25 mg total) by mouth daily. 90 tablet 3  . Multiple Vitamins-Minerals (MULTIVITAMIN WITH MINERALS) tablet Take 1 tablet by mouth daily. Packet of varies vitamins    . predniSONE (DELTASONE) 5 MG tablet     . PROAIR HFA 108 (90 BASE) MCG/ACT inhaler INHALE 2 SPRAYS EVERY 4 HOURS AS NEEDED 8.5 Inhaler 12  . VIAGRA 100 MG tablet TAKE 1 TABLET BY MOUTH EVERY DAY 10 tablet 12  . amoxicillin (AMOXIL) 500 MG capsule Take 1 capsule (500 mg total) by mouth 3 (three) times daily. 4 capsule 0  . aspirin EC 325 MG EC tablet 1 tab a day for the next 30 days to prevent blood clots 30 tablet 0  . celecoxib (CELEBREX) 200 MG capsule 1 tab po q day with food for pain and  swelling (Patient not taking: Reported on 12/03/2015) 30 capsule 3  . docusate sodium (COLACE) 100 MG capsule 1 tab 2 times a day while on narcotics.  STOOL SOFTENER 60 capsule 0  . oxyCODONE (OXY IR/ROXICODONE) 5 MG immediate release tablet 1-2 tablets every 4-6 hrs as needed for pain 100 tablet 0  . polyethylene glycol (MIRALAX / GLYCOLAX) packet 17grams in 4 oz of water twice a day until  bowel movement.  LAXITIVE.  Restart if two days since last bowel movement 14 each 0   No facility-administered medications prior to visit.   Outpatient Encounter Prescriptions as of 12/16/2015  Medication Sig  . FLOVENT HFA 110 MCG/ACT inhaler INHALE 2 PUFFS INTO THE LUNGS TWICE A DAY  . hydrochlorothiazide (HYDRODIURIL) 25 MG tablet Take 1 tablet (25 mg total) by mouth daily.  . Multiple Vitamins-Minerals (MULTIVITAMIN WITH MINERALS) tablet Take 1 tablet by mouth daily. Packet of varies vitamins  . predniSONE (DELTASONE) 5 MG tablet   . PROAIR HFA 108 (90 BASE) MCG/ACT inhaler INHALE 2 SPRAYS EVERY 4 HOURS AS NEEDED  . VIAGRA 100 MG tablet TAKE 1 TABLET BY MOUTH EVERY DAY  . [DISCONTINUED] amoxicillin (AMOXIL) 500 MG capsule Take 1 capsule (500 mg total) by mouth 3 (three) times daily.  . [DISCONTINUED] aspirin EC 325 MG EC tablet 1 tab a  day for the next 30 days to prevent blood clots  . [DISCONTINUED] celecoxib (CELEBREX) 200 MG capsule 1 tab po q day with food for pain and  swelling (Patient not taking: Reported on 12/03/2015)  . [DISCONTINUED] docusate sodium (COLACE) 100 MG capsule 1 tab 2 times a day while on narcotics.  STOOL SOFTENER  . [DISCONTINUED] oxyCODONE (OXY IR/ROXICODONE) 5 MG immediate release tablet 1-2 tablets every 4-6 hrs as needed for pain  . [DISCONTINUED] polyethylene glycol (MIRALAX / GLYCOLAX) packet 17grams in 4 oz of water twice a day until bowel movement.  LAXITIVE.  Restart if two days since last bowel movement   No facility-administered encounter medications on file as of 12/16/2015.    Allergies  Allergen Reactions  . Lisinopril Cough    cough, voice was affected and had hard time singing  . Achromycin [Tetracycline] Rash    Review of Systems  Constitutional: Negative for fever and malaise/fatigue.  Respiratory: Positive for wheezing (allergy/asthma related). Negative for cough, hemoptysis, sputum production and shortness of breath.   Cardiovascular: Positive for leg swelling (secondary to surgery). Negative for chest pain, palpitations, orthopnea and claudication.  Neurological: Negative for dizziness, weakness and headaches.   Objective:  BP 142/70 mmHg  Pulse 72  Temp(Src) 97.9 F (36.6 C) (Oral)  Resp 16  Wt 257 lb (116.574 kg)  Physical Exam  Constitutional: He is oriented to person, place, and time and well-developed, well-nourished, and in no distress.  HENT:  Head: Normocephalic.  Eyes: Pupils are equal, round, and reactive to light.  Neck: Normal range of motion. Neck supple.  Cardiovascular: Normal rate, regular rhythm and normal heart sounds.   Pulmonary/Chest: Effort normal and breath sounds normal.  Musculoskeletal: Edema: 1+ on the right and trace on the left.  Neurological: He is alert and oriented to person, place, and time. Gait normal.  Skin: Skin is warm and  dry.  Psychiatric: Mood, memory, affect and judgment normal.    Assessment and Plan :   1. Essential hypertension  - CBC With Differential/Platelet - Comprehensive metabolic panel - TSH  2. Erectile dysfunction, unspecified erectile dysfunction type  - sildenafil (REVATIO) 20 MG tablet; Take 1 tablet (20 mg total) by mouth 2 (two) times daily at 10 am and 4 pm.  Dispense: 30 tablet; Refill: 12  3. Hyperlipidemia  - Lipid Panel With LDL/HDL Ratio   I have done the exam and reviewed the above chart and it is accurate to the best of my knowledge.  Miguel Aschoff MD [facility] 12/16/2015 10:42 AM

## 2015-12-17 DIAGNOSIS — R262 Difficulty in walking, not elsewhere classified: Secondary | ICD-10-CM | POA: Diagnosis not present

## 2015-12-17 DIAGNOSIS — M25661 Stiffness of right knee, not elsewhere classified: Secondary | ICD-10-CM | POA: Diagnosis not present

## 2015-12-17 DIAGNOSIS — M25561 Pain in right knee: Secondary | ICD-10-CM | POA: Diagnosis not present

## 2015-12-17 DIAGNOSIS — M6281 Muscle weakness (generalized): Secondary | ICD-10-CM | POA: Diagnosis not present

## 2015-12-23 ENCOUNTER — Encounter: Payer: Self-pay | Admitting: *Deleted

## 2015-12-23 NOTE — Telephone Encounter (Signed)
No response from patient. Letter mailed.

## 2015-12-24 DIAGNOSIS — R262 Difficulty in walking, not elsewhere classified: Secondary | ICD-10-CM | POA: Diagnosis not present

## 2015-12-24 DIAGNOSIS — M6281 Muscle weakness (generalized): Secondary | ICD-10-CM | POA: Diagnosis not present

## 2015-12-24 DIAGNOSIS — M25561 Pain in right knee: Secondary | ICD-10-CM | POA: Diagnosis not present

## 2015-12-24 DIAGNOSIS — M25661 Stiffness of right knee, not elsewhere classified: Secondary | ICD-10-CM | POA: Diagnosis not present

## 2016-01-01 DIAGNOSIS — E785 Hyperlipidemia, unspecified: Secondary | ICD-10-CM | POA: Diagnosis not present

## 2016-01-01 DIAGNOSIS — I10 Essential (primary) hypertension: Secondary | ICD-10-CM | POA: Diagnosis not present

## 2016-01-03 LAB — COMPREHENSIVE METABOLIC PANEL
ALBUMIN: 4.2 g/dL (ref 3.6–4.8)
ALK PHOS: 51 IU/L (ref 39–117)
ALT: 23 IU/L (ref 0–44)
AST: 23 IU/L (ref 0–40)
Albumin/Globulin Ratio: 1.8 (ref 1.2–2.2)
BILIRUBIN TOTAL: 0.4 mg/dL (ref 0.0–1.2)
BUN / CREAT RATIO: 16 (ref 10–24)
BUN: 18 mg/dL (ref 8–27)
CHLORIDE: 99 mmol/L (ref 96–106)
CO2: 27 mmol/L (ref 18–29)
CREATININE: 1.16 mg/dL (ref 0.76–1.27)
Calcium: 9.3 mg/dL (ref 8.6–10.2)
GFR calc non Af Amer: 65 mL/min/{1.73_m2} (ref >59)
GFR, EST AFRICAN AMERICAN: 75 mL/min/{1.73_m2} (ref >59)
GLOBULIN, TOTAL: 2.3 g/dL (ref 1.5–4.5)
Glucose: 114 mg/dL — ABNORMAL HIGH (ref 65–99)
Potassium: 4.6 mmol/L (ref 3.5–5.2)
SODIUM: 140 mmol/L (ref 134–144)
TOTAL PROTEIN: 6.5 g/dL (ref 6.0–8.5)

## 2016-01-03 LAB — CBC WITH DIFFERENTIAL/PLATELET

## 2016-01-03 LAB — TSH: TSH: 2.27 u[IU]/mL (ref 0.450–4.500)

## 2016-01-03 LAB — LIPID PANEL WITH LDL/HDL RATIO
CHOLESTEROL TOTAL: 239 mg/dL — AB (ref 100–199)
HDL: 33 mg/dL — ABNORMAL LOW (ref >39)
LDL CALC: 148 mg/dL — AB (ref 0–99)
LDl/HDL Ratio: 4.5 ratio units — ABNORMAL HIGH (ref 0.0–3.6)
Triglycerides: 291 mg/dL — ABNORMAL HIGH (ref 0–149)
VLDL Cholesterol Cal: 58 mg/dL — ABNORMAL HIGH (ref 5–40)

## 2016-01-08 DIAGNOSIS — M25561 Pain in right knee: Secondary | ICD-10-CM | POA: Diagnosis not present

## 2016-03-24 ENCOUNTER — Other Ambulatory Visit (HOSPITAL_COMMUNITY): Payer: Self-pay | Admitting: Orthopedic Surgery

## 2016-03-24 ENCOUNTER — Ambulatory Visit (HOSPITAL_COMMUNITY)
Admission: RE | Admit: 2016-03-24 | Discharge: 2016-03-24 | Disposition: A | Discharge status: Home / Self Care | Payer: PPO | Source: Ambulatory Visit | Attending: Cardiology | Admitting: Cardiology

## 2016-03-24 DIAGNOSIS — K219 Gastro-esophageal reflux disease without esophagitis: Secondary | ICD-10-CM | POA: Insufficient documentation

## 2016-03-24 DIAGNOSIS — I10 Essential (primary) hypertension: Secondary | ICD-10-CM | POA: Diagnosis not present

## 2016-03-24 DIAGNOSIS — M7989 Other specified soft tissue disorders: Secondary | ICD-10-CM

## 2016-03-24 DIAGNOSIS — M25571 Pain in right ankle and joints of right foot: Secondary | ICD-10-CM | POA: Diagnosis not present

## 2016-03-24 DIAGNOSIS — M79604 Pain in right leg: Secondary | ICD-10-CM

## 2016-03-24 DIAGNOSIS — G4730 Sleep apnea, unspecified: Secondary | ICD-10-CM | POA: Diagnosis not present

## 2016-03-26 ENCOUNTER — Other Ambulatory Visit: Payer: Self-pay | Admitting: Family Medicine

## 2016-04-08 DIAGNOSIS — M1712 Unilateral primary osteoarthritis, left knee: Secondary | ICD-10-CM | POA: Diagnosis not present

## 2016-04-08 DIAGNOSIS — Z96651 Presence of right artificial knee joint: Secondary | ICD-10-CM | POA: Diagnosis not present

## 2016-04-22 ENCOUNTER — Other Ambulatory Visit: Payer: Self-pay | Admitting: Family Medicine

## 2016-05-19 ENCOUNTER — Encounter: Payer: Self-pay | Admitting: *Deleted

## 2016-05-25 ENCOUNTER — Encounter: Payer: Self-pay | Admitting: General Surgery

## 2016-05-25 ENCOUNTER — Ambulatory Visit (INDEPENDENT_AMBULATORY_CARE_PROVIDER_SITE_OTHER): Payer: PPO | Admitting: General Surgery

## 2016-05-25 VITALS — BP 122/66 | HR 68 | Resp 16 | Ht 69.0 in | Wt 259.0 lb

## 2016-05-25 DIAGNOSIS — K429 Umbilical hernia without obstruction or gangrene: Secondary | ICD-10-CM | POA: Diagnosis not present

## 2016-05-25 DIAGNOSIS — Z1211 Encounter for screening for malignant neoplasm of colon: Secondary | ICD-10-CM | POA: Diagnosis not present

## 2016-05-25 NOTE — Patient Instructions (Addendum)
Bring Cpap on day of Procedure     Colonoscopy A colonoscopy is an exam to look at the entire large intestine (colon). This exam can help find problems such as tumors, polyps, inflammation, and areas of bleeding. The exam takes about 1 hour.  LET [facility] CARE PROVIDER KNOW ABOUT:   Any allergies you have.  All medicines you are taking, including vitamins, herbs, eye drops, creams, and over-the-counter medicines.  Previous problems you or members of your family have had with the use of anesthetics.  Any blood disorders you have.  Previous surgeries you have had.  Medical conditions you have. RISKS AND COMPLICATIONS  Generally, this is a safe procedure. However, as with any procedure, complications can occur. Possible complications include:  Bleeding.  Tearing or rupture of the colon wall.  Reaction to medicines given during the exam.  Infection (rare). BEFORE THE PROCEDURE   Ask your health care provider about changing or stopping your regular medicines.  You may be prescribed an oral bowel prep. This involves drinking a large amount of medicated liquid, starting the day before your procedure. The liquid will cause you to have multiple loose stools until your stool is almost clear or light green. This cleans out your colon in preparation for the procedure.  Do not eat or drink anything else once you have started the bowel prep, unless your health care provider tells you it is safe to do so.  Arrange for someone to drive you home after the procedure. PROCEDURE   You will be given medicine to help you relax (sedative).  You will lie on your side with your knees bent.  A long, flexible tube with a light and camera on the end (colonoscope) will be inserted through the rectum and into the colon. The camera sends video back to a computer screen as it moves through the colon. The colonoscope also releases carbon dioxide gas to inflate the colon. This helps your health care  provider see the area better.  During the exam, your health care provider may take a small tissue sample (biopsy) to be examined under a microscope if any abnormalities are found.  The exam is finished when the entire colon has been viewed. AFTER THE PROCEDURE   Do not drive for 24 hours after the exam.  You may have a small amount of blood in your stool.  You may pass moderate amounts of gas and have mild abdominal cramping or bloating. This is caused by the gas used to inflate your colon during the exam.  Ask when your test results will be ready and how you will get your results. Make sure you get your test results.   This information is not intended to replace advice given to you by your health care provider. Make sure you discuss any questions you have with your health care provider.   Document Released: 08/07/2000 Document Revised: 05/31/2013 Document Reviewed: 04/17/2013 Elsevier Interactive Patient Education [employer].

## 2016-05-25 NOTE — Progress Notes (Signed)
Patient ID: OMMAR NOTTAGE, male   DOB: June 15, 1951, 65 y.o.   MRN: [id number]  Chief Complaint  Patient presents with  . Colonoscopy    HPI SOURYA ISIP is a 65 y.o. male is here today to discuss getting a colonosocopy. His last one was done in 2007. He reports no GI Issues, denies bleeding. Moves bowels daily.   HPI  Past Medical History:  Diagnosis Date  . Arthritis   . Asthma   . Family history of adverse reaction to anesthesia    Patients mother went into cardiac arrest twice during surgery  . GERD (gastroesophageal reflux disease)   . Hypertension   . Pneumonia    hx of  . Primary localized osteoarthritis of right knee   . Sleep apnea    wears CPAP nightly  . Urgency of urination     Past Surgical History:  Procedure Laterality Date  . APPENDECTOMY    . COLONOSCOPY  2007  . COLONOSCOPY W/ POLYPECTOMY    . HERNIA REPAIR     65 yrs old and 65 yrs old  . hydrocelectomy    . KNEE SURGERY Right   . SKIN GRAFT    . SKIN SPLIT GRAFT    . TOTAL KNEE ARTHROPLASTY Right 10/07/2015   Procedure: TOTAL KNEE ARTHROPLASTY;  Surgeon: Elsie Saas, MD;  Location: [place];  Service: Orthopedics;  Laterality: Right;    Family History  Problem Relation Age of Onset  . Heart attack Mother   . Heart failure Mother   . Diabetes Mother   . Hypertension Mother   . Heart disease Mother   . Heart attack Sister   . Heart disease Sister   . Diabetes Sister   . Heart disease Father   . Cancer Father   . Diabetes Son   . Heart attack Maternal Grandfather   . Heart disease Son     Social History Social History  Substance Use Topics  . Smoking status: Former Research scientist (life sciences)  . Smokeless tobacco: Never Used  . Alcohol use No    Allergies  Allergen Reactions  . Lisinopril Cough    cough, voice was affected and had hard time singing  . Achromycin [Tetracycline] Rash    Current Outpatient Prescriptions  Medication Sig Dispense Refill  . FLOVENT HFA 110 MCG/ACT inhaler INHALE 2  PUFFS INTO THE LUNGS TWICE A DAY 12 Inhaler 12  . hydrochlorothiazide (HYDRODIURIL) 25 MG tablet Take 1 tablet (25 mg total) by mouth daily. 90 tablet 3  . Multiple Vitamins-Minerals (MULTIVITAMIN WITH MINERALS) tablet Take 1 tablet by mouth daily. Packet of varies vitamins    . OVER THE COUNTER MEDICATION once. Omax 3 Omega-3 Supplement ProResolv EPA/DHA     . PROAIR HFA 108 (90 BASE) MCG/ACT inhaler INHALE 2 SPRAYS EVERY 4 HOURS AS NEEDED 8.5 Inhaler 12   No current facility-administered medications for this visit.     Review of Systems Review of Systems  Blood pressure 122/66, pulse 68, resp. rate 16, height 5\' 9"  (1.753 m), weight 259 lb (117.5 kg).  Physical Exam Physical Exam  Constitutional: He is oriented to person, place, and time. He appears well-developed and well-nourished.  Eyes: Conjunctivae are normal.  Cardiovascular: Normal rate and regular rhythm.   Murmur heard.  Systolic murmur is present with a grade of 2/6  Pulmonary/Chest: Effort normal and breath sounds normal.  Abdominal: Soft. Bowel sounds are normal. There is no tenderness. A hernia (umbilical with non reducible fat) is present.  Neurological: He is alert and oriented to person, place, and time.  Skin: Skin is warm and dry.    Data Reviewed None  Assessment    Candidate for screening colonoscopy.    Plan    Will have the patient make use of amoxicillin 500 mg capsules, #4 by mouth 1 hour prior to the procedure.    Colonoscopy with possible biopsy/polypectomy prn: Information regarding the procedure, including its potential risks and complications (including but not limited to perforation of the bowel, which may require emergency surgery to repair, and bleeding) was verbally given to the patient. Educational information regarding lower intestinal endoscopy was given to the patient. Written instructions for how to complete the bowel prep using Miralax were provided. The importance of drinking  ample fluids to avoid dehydration as a result of the prep emphasized.  Will discuss need of inhalers with his primary care doctor.   Patient aware to bring Cpap on day of procedure.   Stop Omax fish oil 1 week before procedure   Robert Bellow 05/26/2016, 8:43 PM

## 2016-05-26 DIAGNOSIS — K429 Umbilical hernia without obstruction or gangrene: Secondary | ICD-10-CM | POA: Insufficient documentation

## 2016-05-27 ENCOUNTER — Other Ambulatory Visit: Payer: Self-pay

## 2016-05-27 ENCOUNTER — Other Ambulatory Visit: Payer: Self-pay | Admitting: Orthopedic Surgery

## 2016-05-27 ENCOUNTER — Telehealth: Payer: Self-pay | Admitting: *Deleted

## 2016-05-27 ENCOUNTER — Ambulatory Visit
Admission: RE | Admit: 2016-05-27 | Discharge: 2016-05-27 | Disposition: A | Discharge status: Home / Self Care | Payer: PPO | Source: Ambulatory Visit | Attending: Orthopedic Surgery | Admitting: Orthopedic Surgery

## 2016-05-27 DIAGNOSIS — M25562 Pain in left knee: Principal | ICD-10-CM

## 2016-05-27 DIAGNOSIS — G8929 Other chronic pain: Secondary | ICD-10-CM

## 2016-05-27 DIAGNOSIS — M25561 Pain in right knee: Secondary | ICD-10-CM

## 2016-05-27 DIAGNOSIS — Z96651 Presence of right artificial knee joint: Secondary | ICD-10-CM | POA: Diagnosis not present

## 2016-05-27 DIAGNOSIS — M25461 Effusion, right knee: Secondary | ICD-10-CM | POA: Diagnosis not present

## 2016-05-27 DIAGNOSIS — S83282A Other tear of lateral meniscus, current injury, left knee, initial encounter: Secondary | ICD-10-CM | POA: Diagnosis not present

## 2016-05-27 DIAGNOSIS — M1712 Unilateral primary osteoarthritis, left knee: Secondary | ICD-10-CM | POA: Diagnosis not present

## 2016-05-27 DIAGNOSIS — M179 Osteoarthritis of knee, unspecified: Secondary | ICD-10-CM | POA: Diagnosis not present

## 2016-05-27 MED ORDER — POLYETHYLENE GLYCOL 3350 17 GM/SCOOP PO POWD: 1.0000 | Freq: Once | ORAL | 0 refills | Status: AC

## 2016-05-27 NOTE — Telephone Encounter (Signed)
These notify the patient I would like him to make use of amoxicillin, 500 mg capsules, #4, one hour prior to presentation for his colonoscopy. This is to minimize risk of infection of his recently placed knee joint. Thank you. He states he already has the antibiotics from earlier in the year when he previously had this scheduled.

## 2016-05-27 NOTE — Progress Notes (Signed)
Patient ID: Kenneth Hester, male   DOB: 1951-05-28, 65 y.o.   MRN: [id number] The patient is scheduled for a Colonoscopy at [facility] on 07/01/16. They are aware to call the day before to get their arrival time. He will stop his Fish Oil 1 week prior. He will bring his CPAP machine with him. He will only take his blood pressure medication at 6 am with a sip of water the morning of. Miralax prescription has been sent into the patient's pharmacy. The patient is aware of date and instructions.

## 2016-05-27 NOTE — Telephone Encounter (Signed)
-----   Message from Robert Bellow, MD sent at 05/27/2016  8:41 AM EDT ----- The patient should contact Annia Belt, respiratory therapist at [facility] at [phone] for advice regarding his CPAP unit. She may be able to help with his present problem, were explained to him options for other service.

## 2016-06-08 ENCOUNTER — Ambulatory Visit: Payer: PPO

## 2016-06-16 ENCOUNTER — Ambulatory Visit
Admission: RE | Admit: 2016-06-16 | Discharge: 2016-06-16 | Disposition: A | Discharge status: Home / Self Care | Payer: PPO | Source: Ambulatory Visit | Attending: Orthopedic Surgery | Admitting: Orthopedic Surgery

## 2016-06-16 DIAGNOSIS — G8929 Other chronic pain: Secondary | ICD-10-CM

## 2016-06-16 DIAGNOSIS — S83222A Peripheral tear of medial meniscus, current injury, left knee, initial encounter: Secondary | ICD-10-CM | POA: Insufficient documentation

## 2016-06-16 DIAGNOSIS — M23322 Other meniscus derangements, posterior horn of medial meniscus, left knee: Secondary | ICD-10-CM | POA: Insufficient documentation

## 2016-06-16 DIAGNOSIS — M25562 Pain in left knee: Secondary | ICD-10-CM | POA: Diagnosis not present

## 2016-06-16 DIAGNOSIS — M948X6 Other specified disorders of cartilage, lower leg: Secondary | ICD-10-CM | POA: Diagnosis not present

## 2016-06-25 ENCOUNTER — Encounter: Payer: Self-pay | Admitting: *Deleted

## 2016-06-25 NOTE — Progress Notes (Signed)
Patient was contacted today and confirms no medication changes since last office visit.   Also, states he has picked up Miralax prescription.   We will proceed with colonoscopy scheduled for 07-01-16 at [facility].   This patient was instructed to call the office should he have further questions.

## 2016-06-30 ENCOUNTER — Encounter: Payer: Self-pay | Admitting: *Deleted

## 2016-07-01 ENCOUNTER — Encounter
Admission: RE | Disposition: A | Discharge status: Home / Self Care | Payer: Self-pay | Source: Ambulatory Visit | Attending: General Surgery

## 2016-07-01 ENCOUNTER — Ambulatory Visit
Admission: RE | Admit: 2016-07-01 | Discharge: 2016-07-01 | Disposition: A | Discharge status: Home / Self Care | Payer: PPO | Source: Ambulatory Visit | Attending: General Surgery | Admitting: General Surgery

## 2016-07-01 ENCOUNTER — Ambulatory Visit: Payer: PPO | Admitting: Anesthesiology

## 2016-07-01 DIAGNOSIS — Z79899 Other long term (current) drug therapy: Secondary | ICD-10-CM | POA: Insufficient documentation

## 2016-07-01 DIAGNOSIS — Z6837 Body mass index (BMI) 37.0-37.9, adult: Secondary | ICD-10-CM | POA: Diagnosis not present

## 2016-07-01 DIAGNOSIS — K219 Gastro-esophageal reflux disease without esophagitis: Secondary | ICD-10-CM | POA: Diagnosis not present

## 2016-07-01 DIAGNOSIS — D120 Benign neoplasm of cecum: Secondary | ICD-10-CM | POA: Insufficient documentation

## 2016-07-01 DIAGNOSIS — G4730 Sleep apnea, unspecified: Secondary | ICD-10-CM | POA: Insufficient documentation

## 2016-07-01 DIAGNOSIS — Z96651 Presence of right artificial knee joint: Secondary | ICD-10-CM | POA: Diagnosis not present

## 2016-07-01 DIAGNOSIS — J45909 Unspecified asthma, uncomplicated: Secondary | ICD-10-CM | POA: Insufficient documentation

## 2016-07-01 DIAGNOSIS — R3915 Urgency of urination: Secondary | ICD-10-CM | POA: Insufficient documentation

## 2016-07-01 DIAGNOSIS — Z87891 Personal history of nicotine dependence: Secondary | ICD-10-CM | POA: Insufficient documentation

## 2016-07-01 DIAGNOSIS — M1990 Unspecified osteoarthritis, unspecified site: Secondary | ICD-10-CM | POA: Insufficient documentation

## 2016-07-01 DIAGNOSIS — D127 Benign neoplasm of rectosigmoid junction: Secondary | ICD-10-CM | POA: Insufficient documentation

## 2016-07-01 DIAGNOSIS — Z888 Allergy status to other drugs, medicaments and biological substances status: Secondary | ICD-10-CM | POA: Insufficient documentation

## 2016-07-01 DIAGNOSIS — Z881 Allergy status to other antibiotic agents status: Secondary | ICD-10-CM | POA: Insufficient documentation

## 2016-07-01 DIAGNOSIS — E669 Obesity, unspecified: Secondary | ICD-10-CM | POA: Diagnosis not present

## 2016-07-01 DIAGNOSIS — Z1211 Encounter for screening for malignant neoplasm of colon: Secondary | ICD-10-CM | POA: Insufficient documentation

## 2016-07-01 DIAGNOSIS — I10 Essential (primary) hypertension: Secondary | ICD-10-CM | POA: Insufficient documentation

## 2016-07-01 DIAGNOSIS — K635 Polyp of colon: Secondary | ICD-10-CM | POA: Diagnosis not present

## 2016-07-01 DIAGNOSIS — D125 Benign neoplasm of sigmoid colon: Secondary | ICD-10-CM | POA: Diagnosis not present

## 2016-07-01 HISTORY — PX: COLONOSCOPY WITH PROPOFOL: SHX5780

## 2016-07-01 SURGERY — COLONOSCOPY WITH PROPOFOL
Anesthesia: General

## 2016-07-01 MED ORDER — SODIUM CHLORIDE 0.9 % IV SOLN
INTRAVENOUS | Status: DC
  Administered 2016-07-01: 1000 mL via INTRAVENOUS

## 2016-07-01 MED ORDER — LIDOCAINE HCL (PF) 1 % IJ SOLN
2.0000 mL | Freq: Once | INTRAMUSCULAR | Status: AC
  Administered 2016-07-01: 0.1 mL via INTRADERMAL

## 2016-07-01 MED ORDER — PROPOFOL 500 MG/50ML IV EMUL
INTRAVENOUS | Status: DC | PRN
  Administered 2016-07-01: 160 ug/kg/min via INTRAVENOUS

## 2016-07-01 MED ORDER — LIDOCAINE HCL (PF) 1 % IJ SOLN
INTRAMUSCULAR | Status: AC
  Administered 2016-07-01: 0.1 mL via INTRADERMAL
  Filled 2016-07-01: qty 2

## 2016-07-01 MED ORDER — PROPOFOL 10 MG/ML IV BOLUS
INTRAVENOUS | Status: DC | PRN
  Administered 2016-07-01: 50 mg via INTRAVENOUS

## 2016-07-01 MED ORDER — LIDOCAINE HCL (CARDIAC) 20 MG/ML IV SOLN
INTRAVENOUS | Status: DC | PRN
Start: 2016-07-01 — End: 2016-07-01
  Administered 2016-07-01: 40 mg via INTRAVENOUS

## 2016-07-01 NOTE — Anesthesia Procedure Notes (Signed)
Date/Time: 07/01/2016 9:08 AM Performed by: Doreen Salvage Pre-anesthesia Checklist: Patient identified, Emergency Drugs available, Suction available and Patient being monitored Patient Re-evaluated:Patient Re-evaluated prior to inductionOxygen Delivery Method: Nasal cannula Intubation Type: IV induction Dental Injury: Teeth and Oropharynx as per pre-operative assessment  Comments: Nasal cannula with etCO2 monitoring

## 2016-07-01 NOTE — H&P (Signed)
Kenneth Hester [id number] Dec 01, 1950     HPI: Healthy male for screening colonoscopy. Tolerated prep well.    Prescriptions Prior to Admission  Medication Sig Dispense Refill Last Dose  . amoxicillin (AMOXIL) 500 MG tablet Take 500 mg by mouth 2 (two) times daily.   07/01/2016 at 0600  . FLOVENT HFA 110 MCG/ACT inhaler INHALE 2 PUFFS INTO THE LUNGS TWICE A DAY 12 Inhaler 12 07/01/2016 at 0600  . hydrochlorothiazide (HYDRODIURIL) 25 MG tablet Take 1 tablet (25 mg total) by mouth daily. 90 tablet 3 07/01/2016 at 0600  . Multiple Vitamins-Minerals (MULTIVITAMIN WITH MINERALS) tablet Take 1 tablet by mouth daily. Packet of varies vitamins   Taking  . OVER THE COUNTER MEDICATION once. Omax 3 Omega-3 Supplement ProResolv EPA/DHA    Taking  . PROAIR HFA 108 (90 BASE) MCG/ACT inhaler INHALE 2 SPRAYS EVERY 4 HOURS AS NEEDED 8.5 Inhaler 12 07/01/2016 at 0600   Allergies  Allergen Reactions  . Lisinopril Cough    cough, voice was affected and had hard time singing  . Achromycin [Tetracycline] Rash   Past Medical History:  Diagnosis Date  . Arthritis   . Asthma   . Family history of adverse reaction to anesthesia    Patients mother went into cardiac arrest twice during surgery  . GERD (gastroesophageal reflux disease)   . Hypertension   . Pneumonia    hx of  . Primary localized osteoarthritis of right knee   . Sleep apnea    wears CPAP nightly  . Urgency of urination    Past Surgical History:  Procedure Laterality Date  . APPENDECTOMY    . COLONOSCOPY  2007  . COLONOSCOPY W/ POLYPECTOMY    . HERNIA REPAIR     65 yrs old and 65 yrs old  . hydrocelectomy    . KNEE SURGERY Right   . SKIN GRAFT    . SKIN SPLIT GRAFT    . TOTAL KNEE ARTHROPLASTY Right 10/07/2015   Procedure: TOTAL KNEE ARTHROPLASTY;  Surgeon: Elsie Saas, MD;  Location: [place];  Service: Orthopedics;  Laterality: Right;   Social History   Social History  . Marital status: Married    Spouse name: Pamala Hurry  .  Number of children: N/A  . Years of education: N/A   Occupational History  . Not on file.   Social History Main Topics  . Smoking status: Former Research scientist (life sciences)  . Smokeless tobacco: Never Used  . Alcohol use No  . Drug use: No  . Sexual activity: Yes   Other Topics Concern  . Not on file   Social History Narrative  . No narrative on file   Social History   Social History Narrative  . No narrative on file     ROS: Negative.     PE: HEENT: Negative. Lungs: Clear. Cardio: RR.   Assessment/Plan:  Proceed with planned endoscopy.  Robert Bellow 07/01/2016

## 2016-07-01 NOTE — Anesthesia Preprocedure Evaluation (Signed)
Anesthesia Evaluation  Patient identified by MRN, date of birth, ID band Patient awake    Reviewed: Allergy & Precautions, NPO status , Patient's Chart, lab work & pertinent test results  History of Anesthesia Complications (+) history of anesthetic complications  Airway Mallampati: II  TM Distance: >3 FB Neck ROM: Full    Dental no notable dental hx.    Pulmonary asthma (mild intermittent, does not need daily controller) , sleep apnea and Continuous Positive Airway Pressure Ventilation , former smoker,    breath sounds clear to auscultation- rhonchi (-) wheezing      Cardiovascular Exercise Tolerance: Good hypertension, Pt. on medications (-) CAD and (-) Past MI  Rhythm:Regular Rate:Normal - Systolic murmurs and - Diastolic murmurs    Neuro/Psych negative neurological ROS  negative psych ROS   GI/Hepatic Neg liver ROS, GERD  ,  Endo/Other  negative endocrine ROSneg diabetes  Renal/GU negative Renal ROS     Musculoskeletal  (+) Arthritis ,   Abdominal (+) + obese,   Peds  Hematology negative hematology ROS (+)   Anesthesia Other Findings Past Medical History: No date: Arthritis No date: Asthma No date: Family history of adverse reaction to anesthes*     Comment: Patients mother went into cardiac arrest twice              during surgery No date: GERD (gastroesophageal reflux disease) No date: Hypertension No date: Pneumonia     Comment: hx of No date: Primary localized osteoarthritis of right knee No date: Sleep apnea     Comment: wears CPAP nightly No date: Urgency of urination   Reproductive/Obstetrics                             Anesthesia Physical Anesthesia Plan  ASA: III  Anesthesia Plan: General   Post-op Pain Management:    Induction: Intravenous  Airway Management Planned: Natural Airway  Additional Equipment:   Intra-op Plan:   Post-operative Plan:    Informed Consent: I have reviewed the patients History and Physical, chart, labs and discussed the procedure including the risks, benefits and alternatives for the proposed anesthesia with the patient or authorized representative who has indicated his/her understanding and acceptance.   Dental advisory given  Plan Discussed with: CRNA and Anesthesiologist  Anesthesia Plan Comments:         Anesthesia Quick Evaluation

## 2016-07-01 NOTE — Anesthesia Postprocedure Evaluation (Signed)
Anesthesia Post Note  Patient: Kenneth Hester  Procedure(s) Performed: Procedure(s) (LRB): COLONOSCOPY WITH PROPOFOL (N/A)  Patient location during evaluation: Endoscopy Anesthesia Type: General Level of consciousness: awake and alert and oriented Pain management: pain level controlled Vital Signs Assessment: post-procedure vital signs reviewed and stable Respiratory status: spontaneous breathing, nonlabored ventilation and respiratory function stable Cardiovascular status: blood pressure returned to baseline and stable Postop Assessment: no signs of nausea or vomiting Anesthetic complications: no    Last Vitals:  Vitals:   07/01/16 0951 07/01/16 1001  BP: 126/68 (!) 142/73  Pulse:  64  Resp:  15  Temp:      Last Pain:  Vitals:   07/01/16 0931  TempSrc: Tympanic                 [name]

## 2016-07-01 NOTE — Transfer of Care (Signed)
Immediate Anesthesia Transfer of Care Note  Patient: [name]  Procedure(s) Performed: Procedure(s): COLONOSCOPY WITH PROPOFOL (N/A)  Patient Location: PACU  Anesthesia Type:General  Level of Consciousness: sedated  Airway & Oxygen Therapy: Patient Spontanous Breathing and Patient connected to face mask oxygen  Post-op Assessment: Report given to RN and Post -op Vital signs reviewed and stable  Post vital signs: Reviewed and stable  Last Vitals:  Vitals:   07/01/16 0837 07/01/16 0931  BP: (!) 158/75 123/74  Pulse: 70 73  Resp: 16 (!) 21  Temp: (!) 35.9 C [id number] C    Complications: No apparent anesthesia complications

## 2016-07-01 NOTE — Op Note (Signed)
[facility] Gastroenterology Patient Name: Kenneth Hester Procedure Date: 07/01/2016 9:07 AM MRN: [id number] Account #: [id number] Date of Birth: 1951/02/17 Admit Type: Outpatient Age: 65 Room: [facility] ENDO ROOM 3 Gender: Male Note Status: Finalized Procedure:            Colonoscopy Indications:          Screening for colorectal malignant neoplasm Providers:            Robert Bellow, MD Referring MD:         Janine Ores. Rosanna Randy, MD (Referring MD) Medicines:            Monitored Anesthesia Care Complications:        No immediate complications. Procedure:            Pre-Anesthesia Assessment:                       - Prior to the procedure, a History and Physical was                        performed, and patient medications, allergies and                        sensitivities were reviewed. The patient's tolerance of                        previous anesthesia was reviewed.                       - The risks and benefits of the procedure and the                        sedation options and risks were discussed with the                        patient. All questions were answered and informed                        consent was obtained.                       After obtaining informed consent, the colonoscope was                        passed under direct vision. Throughout the procedure,                        the patient's blood pressure, pulse, and oxygen                        saturations were monitored continuously. The                        Colonoscope was introduced through the anus and                        advanced to the the terminal ileum. The colonoscopy was                        performed without difficulty. The patient tolerated the  procedure well. The quality of the bowel preparation                        was excellent. Findings:      A 5 mm polyp was found in the cecum. The polyp was sessile. The polyp       was removed with a  cold snare. Resection and retrieval were complete.      A 8 mm polyp was found in the recto-sigmoid colon. The polyp was       semi-pedunculated. The polyp was removed with a hot snare. Resection and       retrieval were complete.      The retroflexed view of the distal rectum and anal verge was normal and       showed no anal or rectal abnormalities. Impression:           - One 5 mm polyp in the cecum, removed with a cold                        snare. Resected and retrieved.                       - One 8 mm polyp at the recto-sigmoid colon, removed                        with a hot snare. Resected and retrieved.                       - The distal rectum and anal verge are normal on                        retroflexion view. Recommendation:       - Telephone endoscopist for pathology results in 1 week. Procedure Code(s):    --- Professional ---                       [phone], Colonoscopy, flexible; with removal of tumor(s),                        polyp(s), or other lesion(s) by snare technique Diagnosis Code(s):    --- Professional ---                       Z12.11, Encounter for screening for malignant neoplasm                        of colon                       D12.0, Benign neoplasm of cecum                       D12.7, Benign neoplasm of rectosigmoid junction CPT copyright 2016 American Medical Association. All rights reserved. The codes documented in this report are preliminary and upon coder review may  be revised to meet current compliance requirements. Robert Bellow, MD 07/01/2016 9:29:38 AM This report has been signed electronically. Number of Addenda: 0 Note Initiated On: 07/01/2016 9:07 AM Scope Withdrawal Time: 0 hours 10 minutes 25 seconds  Total Procedure Duration: 0 hours 13 minutes 53 seconds       [facility]

## 2016-07-02 ENCOUNTER — Encounter: Payer: Self-pay | Admitting: General Surgery

## 2016-07-02 LAB — SURGICAL PATHOLOGY

## 2016-07-08 ENCOUNTER — Telehealth: Payer: Self-pay

## 2016-07-08 NOTE — Telephone Encounter (Signed)
-----   Message from Robert Bellow, MD sent at 07/07/2016  8:47 PM EST ----- Please notify the patient that his polyps were benign, but one does warrant an early follow-up exam in 3 years. Please put in recalls. ----- Message ----- From: Interface, Lab In Three Zero One Sent: 07/02/2016   2:34 PM To: Robert Bellow, MD

## 2016-07-22 ENCOUNTER — Encounter: Payer: Self-pay | Admitting: General Surgery

## 2016-07-24 NOTE — Telephone Encounter (Signed)
Notified patient as instructed, patient pleased. Discussed follow-up appointments, patient agrees. Patient placed in recalls for 3 years.   

## 2016-08-20 ENCOUNTER — Encounter: Payer: PPO | Admitting: Family Medicine

## 2016-08-26 ENCOUNTER — Encounter (HOSPITAL_COMMUNITY): Payer: Self-pay | Admitting: Physician Assistant

## 2016-08-26 DIAGNOSIS — M1712 Unilateral primary osteoarthritis, left knee: Secondary | ICD-10-CM

## 2016-08-26 HISTORY — DX: Unilateral primary osteoarthritis, left knee: M17.12

## 2016-08-26 NOTE — H&P (Signed)
TOTAL KNEE ADMISSION H&P  Patient is being admitted for left total knee arthroplasty.  Subjective:  Chief Complaint:left knee pain.  HPI: Kenneth Hester, 66 y.o. male, has a history of pain and functional disability in the left knee due to arthritis and has failed non-surgical conservative treatments for greater than 12 weeks to includeNSAID's and/or analgesics, corticosteriod injections, viscosupplementation injections, flexibility and strengthening excercises, supervised PT with diminished ADL's post treatment, weight reduction as appropriate and activity modification.  Onset of symptoms was gradual, starting 10 years ago with gradually worsening course since that time. The patient noted no past surgery on the left knee(s).  Patient currently rates pain in the left knee(s) at 10 out of 10 with activity. Patient has night pain, worsening of pain with activity and weight bearing, pain that interferes with activities of daily living, crepitus and joint swelling.  Patient has evidence of subchondral sclerosis, periarticular osteophytes and joint space narrowing by imaging studies. There is no active infection.  Patient Active Problem List   Diagnosis Date Noted  . Primary localized osteoarthritis of left knee 08/26/2016  . Umbilical hernia without obstruction and without gangrene 05/26/2016  . Encounter for screening colonoscopy 12/03/2015  . Primary localized osteoarthritis of right knee   . Hypertension   . Chest pain 12/05/2013  . Sleep apnea 12/05/2013  . Morbid obesity ([place]) 12/05/2013  . GERD (gastroesophageal reflux disease) 12/05/2013  . Asthma 12/05/2013   Past Medical History:  Diagnosis Date  . Arthritis   . Asthma   . Family history of adverse reaction to anesthesia    Patients mother went into cardiac arrest twice during surgery  . GERD (gastroesophageal reflux disease)   . Hypertension   . Pneumonia    hx of  . Primary localized osteoarthritis of left knee 08/26/2016  .  Primary localized osteoarthritis of right knee   . Sleep apnea    wears CPAP nightly  . Urgency of urination     Past Surgical History:  Procedure Laterality Date  . APPENDECTOMY    . COLONOSCOPY  2007  . COLONOSCOPY W/ POLYPECTOMY    . COLONOSCOPY WITH PROPOFOL N/A 07/01/2016   Procedure: COLONOSCOPY WITH PROPOFOL;  Surgeon: Robert Bellow, MD;  Location: [facility] ENDOSCOPY;  Service: Endoscopy;  Laterality: N/A;  . HERNIA REPAIR     66 yrs old and 66 yrs old  . hydrocelectomy    . KNEE SURGERY Right   . SKIN GRAFT    . SKIN SPLIT GRAFT    . TOTAL KNEE ARTHROPLASTY Right 10/07/2015   Procedure: TOTAL KNEE ARTHROPLASTY;  Surgeon: Elsie Saas, MD;  Location: [place];  Service: Orthopedics;  Laterality: Right;    No current facility-administered medications for this encounter.   Current Outpatient Prescriptions:  .  FLOVENT HFA 110 MCG/ACT inhaler, INHALE 2 PUFFS INTO THE LUNGS TWICE A DAY (Patient taking differently: INHALE 2 PUFFS INTO THE LUNGS TWICE A DAY AS NEEDED FOR SHORTNESS OF BREATH), Disp: 12 Inhaler, Rfl: 12 .  hydrochlorothiazide (HYDRODIURIL) 25 MG tablet, Take 1 tablet (25 mg total) by mouth daily., Disp: 90 tablet, Rfl: 3 .  ibuprofen (ADVIL,MOTRIN) 200 MG tablet, Take 400 mg by mouth daily as needed for headache or moderate pain., Disp: , Rfl:  .  Multiple Vitamins-Minerals (MULTIVITAMIN WITH MINERALS) tablet, Take 1 tablet by mouth daily. , Disp: , Rfl:  .  Omega-3 Fatty Acids (FISH OIL PO), Take 2 capsules by mouth daily., Disp: , Rfl:  .  PROAIR HFA  108 (90 BASE) MCG/ACT inhaler, INHALE 2 SPRAYS EVERY 4 HOURS AS NEEDED (Patient taking differently: INHALE 2 SPRAYS EVERY 4 HOURS AS NEEDED FOR SHORTNESS OF BREATH), Disp: 8.5 Inhaler, Rfl: 12    Allergies  Allergen Reactions  . Lisinopril Cough    cough, voice was affected and had hard time singing  . Achromycin [Tetracycline] Rash    Social History  Substance Use Topics  . Smoking status: Former Research scientist (life sciences)  .  Smokeless tobacco: Never Used  . Alcohol use No    Family History  Problem Relation Age of Onset  . Heart attack Mother   . Heart failure Mother   . Diabetes Mother   . Hypertension Mother   . Heart disease Mother   . Heart attack Sister   . Heart disease Sister   . Diabetes Sister   . Heart disease Father   . Cancer Father   . Diabetes Son   . Heart attack Maternal Grandfather   . Heart disease Son      Review of Systems  Constitutional: Negative.   HENT: Negative.   Eyes: Negative.   Respiratory: Negative.   Cardiovascular: Negative.   Gastrointestinal: Negative.   Genitourinary: Negative.   Musculoskeletal: Positive for back pain and joint pain.  Skin: Negative.   Neurological: Negative.   Endo/Heme/Allergies: Negative.   Psychiatric/Behavioral: Negative.     Objective:  Physical Exam  Constitutional: He is oriented to person, place, and time. He appears well-developed and well-nourished.  HENT:  Head: Normocephalic and atraumatic.  Mouth/Throat: Oropharynx is clear and moist.  Eyes: Conjunctivae are normal. Pupils are equal, round, and reactive to light.  Neck: Neck supple.  Cardiovascular: Normal rate and regular rhythm.   Respiratory: Effort normal and breath sounds normal.  GI: Soft. Bowel sounds are normal.  Genitourinary:  Genitourinary Comments: Not pertinent to current symptomatology therefore not examined.  Musculoskeletal:  . Examination of his right knee reveals well-healed incision.  No swelling or pain today.  Range of motion 0-120 degrees.  Knee is stable.  Examination of his left knee reveals mild varus deformity, 1+ synovitis, mild pain medially, full range of motion, knee is stable with normal patellar tracking.  Neurological: He is alert and oriented to person, place, and time.  Skin: Skin is warm and dry.  Psychiatric: He has a normal mood and affect. His behavior is normal.    Vital signs in last 24 hours: Temp:  [97.9 F (36.6 C)] 97.9  F (36.6 C) (01/03 1600) Pulse Rate:  [87] 87 (01/03 1600) BP: (133)/(72) 133/72 (01/03 1600) SpO2:  [96 %] 96 % (01/03 1600) Weight:  [116.1 kg (256 lb)] 116.1 kg (256 lb) (01/03 1600)  Labs:   Estimated body mass index is 38.92 kg/m as calculated from the following:   Height as of this encounter: 5\' 8"  (1.727 m).   Weight as of this encounter: 116.1 kg (256 lb).   Imaging Review Plain radiographs demonstrate severe degenerative joint disease of the left knee(s). The overall alignment issignificant varus. The bone quality appears to be good for age and reported activity level.  Assessment/Plan:  End stage arthritis, left knee  Principal Problem:   Primary localized osteoarthritis of left knee Active Problems:   Sleep apnea   Morbid obesity (HCC)   GERD (gastroesophageal reflux disease)   Asthma   Hypertension   The patient history, physical examination, clinical judgment of the provider and imaging studies are consistent with end stage degenerative joint disease of the  left knee(s) and total knee arthroplasty is deemed medically necessary. The treatment options including medical management, injection therapy arthroscopy and arthroplasty were discussed at length. The risks and benefits of total knee arthroplasty were presented and reviewed. The risks due to aseptic loosening, infection, stiffness, patella tracking problems, thromboembolic complications and other imponderables were discussed. The patient acknowledged the explanation, agreed to proceed with the plan and consent was signed. Patient is being admitted for inpatient treatment for surgery, pain control, PT, OT, prophylactic antibiotics, VTE prophylaxis, progressive ambulation and ADL's and discharge planning. The patient is planning to be discharged home with home health services

## 2016-08-27 ENCOUNTER — Encounter (HOSPITAL_COMMUNITY)
Admission: RE | Admit: 2016-08-27 | Discharge: 2016-08-27 | Disposition: A | Discharge status: Home / Self Care | Payer: PPO | Source: Ambulatory Visit | Attending: Orthopedic Surgery | Admitting: Orthopedic Surgery

## 2016-08-27 ENCOUNTER — Encounter (HOSPITAL_COMMUNITY): Payer: Self-pay

## 2016-08-27 ENCOUNTER — Ambulatory Visit (INDEPENDENT_AMBULATORY_CARE_PROVIDER_SITE_OTHER): Payer: PPO

## 2016-08-27 ENCOUNTER — Ambulatory Visit (INDEPENDENT_AMBULATORY_CARE_PROVIDER_SITE_OTHER): Payer: PPO | Admitting: Family Medicine

## 2016-08-27 VITALS — BP 140/86 | HR 60 | Temp 97.9°F | Ht 69.0 in | Wt 255.5 lb

## 2016-08-27 VITALS — BP 140/86 | HR 60 | Temp 97.9°F | Resp 16 | Wt 255.0 lb

## 2016-08-27 DIAGNOSIS — K429 Umbilical hernia without obstruction or gangrene: Secondary | ICD-10-CM | POA: Insufficient documentation

## 2016-08-27 DIAGNOSIS — Z0000 Encounter for general adult medical examination without abnormal findings: Secondary | ICD-10-CM

## 2016-08-27 DIAGNOSIS — I10 Essential (primary) hypertension: Secondary | ICD-10-CM | POA: Diagnosis not present

## 2016-08-27 DIAGNOSIS — J45909 Unspecified asthma, uncomplicated: Secondary | ICD-10-CM | POA: Insufficient documentation

## 2016-08-27 DIAGNOSIS — M1711 Unilateral primary osteoarthritis, right knee: Secondary | ICD-10-CM | POA: Diagnosis not present

## 2016-08-27 DIAGNOSIS — Z1211 Encounter for screening for malignant neoplasm of colon: Secondary | ICD-10-CM | POA: Insufficient documentation

## 2016-08-27 DIAGNOSIS — M1712 Unilateral primary osteoarthritis, left knee: Secondary | ICD-10-CM | POA: Diagnosis not present

## 2016-08-27 DIAGNOSIS — K219 Gastro-esophageal reflux disease without esophagitis: Secondary | ICD-10-CM | POA: Insufficient documentation

## 2016-08-27 DIAGNOSIS — E785 Hyperlipidemia, unspecified: Secondary | ICD-10-CM | POA: Diagnosis not present

## 2016-08-27 DIAGNOSIS — Z8249 Family history of ischemic heart disease and other diseases of the circulatory system: Secondary | ICD-10-CM | POA: Insufficient documentation

## 2016-08-27 DIAGNOSIS — Z01811 Encounter for preprocedural respiratory examination: Secondary | ICD-10-CM | POA: Diagnosis not present

## 2016-08-27 DIAGNOSIS — R079 Chest pain, unspecified: Secondary | ICD-10-CM | POA: Insufficient documentation

## 2016-08-27 DIAGNOSIS — Z1159 Encounter for screening for other viral diseases: Secondary | ICD-10-CM | POA: Diagnosis not present

## 2016-08-27 DIAGNOSIS — Z01812 Encounter for preprocedural laboratory examination: Secondary | ICD-10-CM | POA: Insufficient documentation

## 2016-08-27 DIAGNOSIS — G4730 Sleep apnea, unspecified: Secondary | ICD-10-CM | POA: Insufficient documentation

## 2016-08-27 LAB — COMPREHENSIVE METABOLIC PANEL
ALBUMIN: 3.9 g/dL (ref 3.5–5.0)
ALT: 27 U/L (ref 17–63)
AST: 26 U/L (ref 15–41)
Alkaline Phosphatase: 47 U/L (ref 38–126)
Anion gap: 8 (ref 5–15)
BUN: 19 mg/dL (ref 6–20)
CHLORIDE: 106 mmol/L (ref 101–111)
CO2: 27 mmol/L (ref 22–32)
Calcium: 9.2 mg/dL (ref 8.9–10.3)
Creatinine, Ser: 1.25 mg/dL — ABNORMAL HIGH (ref 0.61–1.24)
GFR calc Af Amer: >60 mL/min (ref >60)
GFR calc non Af Amer: 58 mL/min — ABNORMAL LOW (ref >60)
GLUCOSE: 102 mg/dL — AB (ref 65–99)
POTASSIUM: 3.8 mmol/L (ref 3.5–5.1)
Sodium: 141 mmol/L (ref 135–145)
Total Bilirubin: 0.6 mg/dL (ref 0.3–1.2)
Total Protein: 6.4 g/dL — ABNORMAL LOW (ref 6.5–8.1)

## 2016-08-27 LAB — TYPE AND SCREEN
ABO/RH(D): A POS
ANTIBODY SCREEN: NEGATIVE

## 2016-08-27 LAB — CBC WITH DIFFERENTIAL/PLATELET
BASOS ABS: 0 10*3/uL (ref 0.0–0.1)
BASOS PCT: 0 %
EOS PCT: 4 %
Eosinophils Absolute: 0.3 10*3/uL (ref 0.0–0.7)
HEMATOCRIT: 46.1 % (ref 39.0–52.0)
Hemoglobin: 15.8 g/dL (ref 13.0–17.0)
LYMPHS PCT: 36 %
Lymphs Abs: 2.9 10*3/uL (ref 0.7–4.0)
MCH: 33.1 pg (ref 26.0–34.0)
MCHC: 34.3 g/dL (ref 30.0–36.0)
MCV: 96.6 fL (ref 78.0–100.0)
MONO ABS: 0.9 10*3/uL (ref 0.1–1.0)
Monocytes Relative: 11 %
NEUTROS ABS: 4 10*3/uL (ref 1.7–7.7)
Neutrophils Relative %: 49 %
PLATELETS: 163 10*3/uL (ref 150–400)
RBC: 4.77 MIL/uL (ref 4.22–5.81)
RDW: 13.1 % (ref 11.5–15.5)
WBC: 8.1 10*3/uL (ref 4.0–10.5)

## 2016-08-27 LAB — PROTIME-INR
INR: 1.05
Prothrombin Time: 13.7 seconds (ref 11.4–15.2)

## 2016-08-27 LAB — SURGICAL PCR SCREEN
MRSA, PCR: NEGATIVE
STAPHYLOCOCCUS AUREUS: NEGATIVE

## 2016-08-27 NOTE — Progress Notes (Signed)
Subjective:   Kenneth Hester is a 66 y.o. male who presents for Medicare Annual/Subsequent preventive examination.  Review of Systems:  N/A  Cardiac Risk Factors include: advanced age (>80men, >32 women);dyslipidemia;hypertension;male gender;obesity (BMI >30kg/m2)     Objective:    Vitals: BP 140/86 (BP Location: Right Arm)   Pulse 60   Temp 97.9 F (36.6 C) (Oral)   Ht 5\' 9"  (1.753 m)   Wt 255 lb 8 oz (115.9 kg)   BMI 37.73 kg/m   Body mass index is 37.73 kg/m.  Tobacco History  Smoking Status  . Former Smoker  . Types: Cigarettes  Smokeless Tobacco  . Never Used    Comment: > 40 years ago     Counseling given: Not Answered   Past Medical History:  Diagnosis Date  . Arthritis   . Asthma   . Family history of adverse reaction to anesthesia    Patients mother went into cardiac arrest twice during surgery  . GERD (gastroesophageal reflux disease)   . Hypertension   . Pneumonia    hx of  . Primary localized osteoarthritis of left knee 08/26/2016  . Primary localized osteoarthritis of right knee   . Sleep apnea    wears CPAP nightly  . Urgency of urination    Past Surgical History:  Procedure Laterality Date  . APPENDECTOMY    . COLONOSCOPY  2007  . COLONOSCOPY W/ POLYPECTOMY    . COLONOSCOPY WITH PROPOFOL N/A 07/01/2016   Procedure: COLONOSCOPY WITH PROPOFOL;  Surgeon: Robert Bellow, MD;  Location: [facility];  Service: Endoscopy;  Laterality: N/A;  . HERNIA REPAIR     66 yrs old and 66 yrs old  . hydrocelectomy    . KNEE SURGERY Right   . SKIN GRAFT    . SKIN SPLIT GRAFT    . 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 Heart attack Mother   . Heart failure Mother   . Diabetes Mother   . Hypertension Mother   . Heart disease Mother   . Heart attack Sister   .  Heart disease Sister   . Diabetes Sister   . Heart disease Father   . Cancer Father   . Diabetes Son   . Heart attack Maternal Grandfather   . Heart disease Son    History  Sexual Activity  . Sexual activity: Yes    Outpatient Encounter Prescriptions as of 08/27/2016  Medication Sig  . FLOVENT HFA 110 MCG/ACT inhaler INHALE 2 PUFFS INTO THE LUNGS TWICE A DAY (Patient taking differently: INHALE 2 PUFFS INTO THE LUNGS TWICE A DAY AS NEEDED FOR SHORTNESS OF BREATH)  . hydrochlorothiazide (HYDRODIURIL) 25 MG tablet Take 1 tablet (25 mg total) by mouth daily.  . Multiple Vitamins-Minerals (MULTIVITAMIN WITH MINERALS) tablet Take 1 tablet by mouth daily.   . Omega-3 Fatty Acids (FISH OIL PO) Take 2 capsules by mouth daily.  Marland Kitchen PROAIR HFA 108 (90 BASE) MCG/ACT inhaler INHALE 2 SPRAYS EVERY 4 HOURS AS NEEDED (Patient taking differently: INHALE 2 SPRAYS EVERY 4 HOURS AS NEEDED FOR SHORTNESS OF BREATH)  . ibuprofen (ADVIL,MOTRIN) 200 MG tablet Take 400 mg by mouth daily as needed for headache or moderate pain.   No facility-administered encounter medications on file as of 08/27/2016.     Activities of Daily  Living In your present state of health, do you have any difficulty performing the following activities: 08/27/2016 10/07/2015  Hearing? N N  Vision? N N  Difficulty concentrating or making decisions? Y N  Walking or climbing stairs? N Y  Dressing or bathing? N N  Doing errands, shopping? N N  Preparing Food and eating ? N -  Using the Toilet? N -  In the past six months, have you accidently leaked urine? N -  Do you have problems with loss of bowel control? N -  Managing your Medications? N -  Managing your Finances? N -  Housekeeping or managing your Housekeeping? N -  Some recent data might be hidden    Patient Care Team: Jerrol Banana., MD as PCP - General (Family Medicine) Robert Bellow, MD (General Surgery) Elsie Saas, MD as Consulting Physician (Orthopedic  Surgery) Crista Curb. Gloriann Loan, MD as Consulting Physician (Ophthalmology)   Assessment:     Exercise Activities and Dietary recommendations Current Exercise Habits: The patient does not participate in regular exercise at present, Exercise limited by: orthopedic condition(s)  Goals    . Increase water intake          Starting 08/27/16, I will increase my water intake to 4-5 glasses a day.      Fall Risk Fall Risk  08/27/2016 09/04/2015  Falls in the past year? No No   Depression Screen PHQ 2/9 Scores 08/27/2016 09/04/2015  PHQ - 2 Score 0 0    Cognitive Function     6CIT Screen 08/27/2016  What Year? 0 points  What month? 0 points  What time? 0 points  Count back from 20 0 points  Months in reverse 0 points  Repeat phrase 4 points  Total Score 4    Immunization History  Administered Date(s) Administered  . Pneumococcal Conjugate-13 07/27/2014  . Tdap 07/27/2014   Screening Tests Health Maintenance  Topic Date Due  . INFLUENZA VACCINE  03/24/2017 (Originally 03/24/2016)  . PNA vac Low Risk Adult (2 of 2 - PPSV23) 08/24/2017 (Originally 07/28/2015)  . ZOSTAVAX  08/24/2026 (Originally 06/25/2009)  . TETANUS/TDAP  07/27/2024  . COLONOSCOPY  07/01/2026  . Hepatitis C Screening  Completed      Plan:  I have personally reviewed and addressed the Medicare Annual Wellness questionnaire and have noted the following in the patient's chart:  A. Medical and social history B. Use of alcohol, tobacco or illicit drugs  C. Current medications and supplements D. Functional ability and status E.  Nutritional status F.  Physical activity G. Advance directives H. List of other physicians I.  Hospitalizations, surgeries, and ER visits in previous 12 months J.  [place] such as hearing and vision if needed, cognitive and depression L. Referrals and appointments - none  In addition, I have reviewed and discussed with patient certain preventive protocols, quality metrics, and best  practice recommendations. A written personalized care plan for preventive services as well as general preventive health recommendations were provided to patient.  See attached scanned questionnaire for additional information.   Signed,  Fabio Neighbors, LPN Nurse Health Advisor    MD Recommendations: None. Pt declined influenza, Pneumovax 23 and Zostavax vaccine today. I have reviewed the health advisors note, was  available for consultation and I agree with documentation and plan. Miguel Aschoff MD [facility]

## 2016-08-27 NOTE — Progress Notes (Signed)
Kenneth Hester  MRN: [id number] DOB: [birth year]  Subjective:  HPI   The patient is a 66 year old male who presents for surgical clearance for upcoming total knee replacement.  He is scheduled to have his left knee replaced on 09/07/16 by Dr. Elsie Saas.  The patient is not a smoker and does not drink any alcohol.  He does not take any blood thinner or product with aspirin.   The patient has had general anesthesia before with no complications  Family history of anesthesia complications included his mother having "an allergic reaction to the anesthesia drugs a few times".  Patient reports twice she went into cardiac arrest.  He is not certain what drug was used.    The patient has no personal history of any heart, bleeding or clotting disorder.  He does have history of asthma controlled on current medications. Family history includes father and sister with heart disease, mother and son with diabetes.  No family history of lung disease, bleeding or clotting disorders.   Patient plans his recovery at home with help form his wife. Patient has had previous knee replacement and has no questions at the present time.  Patient Active Problem List   Diagnosis Date Noted  . Primary localized osteoarthritis of left knee 08/26/2016  . Umbilical hernia without obstruction and without gangrene 05/26/2016  . Encounter for screening colonoscopy 12/03/2015  . Primary localized osteoarthritis of right knee   . Hypertension   . Chest pain 12/05/2013  . Sleep apnea 12/05/2013  . Morbid obesity ([place]) 12/05/2013  . GERD (gastroesophageal reflux disease) 12/05/2013  . Asthma 12/05/2013    Past Medical History:  Diagnosis Date  . Arthritis   . Asthma   . Family history of adverse reaction to anesthesia    Patients mother went into cardiac arrest twice during surgery  . GERD (gastroesophageal reflux disease)   . Hypertension   . Pneumonia    hx of  . Primary localized osteoarthritis of left knee  08/26/2016  . Primary localized osteoarthritis of right knee   . Sleep apnea    wears CPAP nightly  . Urgency of urination     Social History   Social History  . Marital status: Married    Spouse name: Pamala Hurry  . Number of children: N/A  . Years of education: N/A   Occupational History  . Not on file.   Social History Main Topics  . Smoking status: Former Smoker    Types: Cigarettes  . Smokeless tobacco: Never Used     Comment: > 40 years ago  . Alcohol use No  . Drug use: No  . Sexual activity: Yes   Other Topics Concern  . Not on file   Social History Narrative  . No narrative on file    Outpatient Encounter Prescriptions as of 08/27/2016  Medication Sig  . FLOVENT HFA 110 MCG/ACT inhaler INHALE 2 PUFFS INTO THE LUNGS TWICE A DAY (Patient taking differently: INHALE 2 PUFFS INTO THE LUNGS TWICE A DAY AS NEEDED FOR SHORTNESS OF BREATH)  . hydrochlorothiazide (HYDRODIURIL) 25 MG tablet Take 1 tablet (25 mg total) by mouth daily.  Marland Kitchen ibuprofen (ADVIL,MOTRIN) 200 MG tablet Take 400 mg by mouth daily as needed for headache or moderate pain.  . Multiple Vitamins-Minerals (MULTIVITAMIN WITH MINERALS) tablet Take 1 tablet by mouth daily.   . Omega-3 Fatty Acids (FISH OIL PO) Take 2 capsules by mouth daily.  Marland Kitchen PROAIR HFA 108 (90 BASE) MCG/ACT inhaler  INHALE 2 SPRAYS EVERY 4 HOURS AS NEEDED (Patient taking differently: INHALE 2 SPRAYS EVERY 4 HOURS AS NEEDED FOR SHORTNESS OF BREATH)   No facility-administered encounter medications on file as of 08/27/2016.     Allergies  Allergen Reactions  . Lisinopril Cough    cough, voice was affected and had hard time singing  . Achromycin [Tetracycline] Rash    Review of Systems  Constitutional: Negative for chills, fever and malaise/fatigue.  Respiratory: Negative for cough, shortness of breath and wheezing.   Cardiovascular: Negative for chest pain, palpitations and leg swelling.  Neurological: Negative for weakness.    Objective:    BP 140/86   Pulse 60   Temp 97.9 F (36.6 C) (Oral)   Resp 16   Wt 255 lb (115.7 kg)   BMI 37.66 kg/m   Physical Exam  Constitutional: He is oriented to person, place, and time and well-developed, well-nourished, and in no distress.  HENT:  Head: Normocephalic and atraumatic.  Eyes: Conjunctivae are normal. No scleral icterus.  Neck: No thyromegaly present.  Cardiovascular: Normal rate and regular rhythm.   Pulmonary/Chest: Effort normal and breath sounds normal.  Abdominal: Soft.  Genitourinary: Rectum normal, prostate normal and penis normal.  Genitourinary Comments: Small skin tag on shaft of penis  Neurological: He is alert and oriented to person, place, and time. Gait normal. GCS score is 15.  Skin: Skin is warm and dry.  Psychiatric: Mood, memory, affect and judgment normal.    Assessment and Plan :   1. Pre-op chest exam/Annual physical exam Patient cleared for orthopedic surgery. - EKG 12-Lead  2. Essential hypertension  - CBC with Differential/Platelet - Comprehensive metabolic panel - TSH  3. Hyperlipidemia, unspecified hyperlipidemia type Family history of CAD with father with bypass surgery in his 67s and maternal grandfather died of MI at age 60. Consider treating lipids aggressively. Recommended routine cardiology referral for risk assessment. - Lipid Panel With LDL/HDL Ratio 4. Osteoarthritis Patient scheduled for total knee replacement  I have done the exam and reviewed the chart and it is accurate to the best of my knowledge. Development worker, community has been used and  any errors in dictation or transcription are unintentional. Miguel Aschoff M.D. [facility]    HPI, Exam and A&P Transcribed under the direction and in the presence of Wilhemena Durie., MD. Electronically Signed: Althea Charon, Alicia

## 2016-08-27 NOTE — Progress Notes (Signed)
PCP - Miguel Aschoff Cardiologist - Dr. Daneen Schick -  Has not seen in a over 3 years  Chest x-ray - not needed EKG - 08/28/15 Stress Test -12/20/13  ECHO - denies Cardiac Cath - denies  Wears Cpap at night with settings of 13 instructed to bring mask and tubing day of surgery    Patient denies shortness of breath, fever, cough and chest pain at PAT appointment

## 2016-08-27 NOTE — Patient Instructions (Signed)
Mr. Kenneth Hester , Thank you for taking time to come for your Medicare Wellness Visit. I appreciate your ongoing commitment to your health goals. Please review the following plan we discussed and let me know if I can assist you in the future.   These are the goals we discussed: Goals    . Increase water intake          Starting 08/27/16, I will increase my water intake to 4-5 glasses a day.       This is a list of the screening recommended for you and due dates:  Health Maintenance  Topic Date Due  .  Hepatitis C: One time screening is recommended by Center for Disease Control  (CDC) for  adults born from [birth year] through 1967.   08/15/1951  . Shingles Vaccine  06/25/2009  . Pneumonia vaccines (2 of 2 - PPSV23) 07/28/2015  . Flu Shot  09/03/2016*  . Tetanus Vaccine  07/27/2024  . Colon Cancer Screening  07/01/2026  *Topic was postponed. The date shown is not the original due date.   Preventive Care for Adults  A healthy lifestyle and preventive care can promote health and wellness. Preventive health guidelines for adults include the following key practices.  . A routine yearly physical is a good way to check with your health care provider about your health and preventive screening. It is a chance to share any concerns and updates on your health and to receive a thorough exam.  . Visit your dentist for a routine exam and preventive care every 6 months. Brush your teeth twice a day and floss once a day. Good oral hygiene prevents tooth decay and gum disease.  . The frequency of eye exams is based on your age, health, family medical history, use  of contact lenses, and other factors. Follow your health care provider's ecommendations for frequency of eye exams.  . Eat a healthy diet. Foods like vegetables, fruits, whole grains, low-fat dairy products, and lean protein foods contain the nutrients you need without too many calories. Decrease your intake of foods high in solid fats, added sugars,  and salt. Eat the right amount of calories for you. Get information about a proper diet from your health care provider, if necessary.  . Regular physical exercise is one of the most important things you can do for your health. Most adults should get at least 150 minutes of moderate-intensity exercise (any activity that increases your heart rate and causes you to sweat) each week. In addition, most adults need muscle-strengthening exercises on 2 or more days a week.  Silver Sneakers may be a benefit available to you. To determine eligibility, you may visit the website: www.silversneakers.com or contact program at [phone] Mon-Fri between 8AM-8PM.   . Maintain a healthy weight. The body mass index (BMI) is a screening tool to identify possible weight problems. It provides an estimate of body fat based on height and weight. Your health care provider can find your BMI and can help you achieve or maintain a healthy weight.   For adults 20 years and older: ? A BMI below 18.5 is considered underweight. ? A BMI of 18.5 to 24.9 is normal. ? A BMI of 25 to 29.9 is considered overweight. ? A BMI of 30 and above is considered obese.   . Maintain normal blood lipids and cholesterol levels by exercising and minimizing your intake of saturated fat. Eat a balanced diet with plenty of fruit and vegetables. Blood tests for lipids and  cholesterol should begin at age 34 and be repeated every 5 years. If your lipid or cholesterol levels are high, you are over 50, or you are at high risk for heart disease, you may need your cholesterol levels checked more frequently. Ongoing high lipid and cholesterol levels should be treated with medicines if diet and exercise are not working.  . If you smoke, find out from your health care provider how to quit. If you do not use tobacco, please do not start.  . If you choose to drink alcohol, please do not consume more than 2 drinks per day. One drink is considered to be 12  ounces (355 mL) of beer, 5 ounces (148 mL) of wine, or 1.5 ounces (44 mL) of liquor.  . If you are 34-32 years old, ask your health care provider if you should take aspirin to prevent strokes.  . Use sunscreen. Apply sunscreen liberally and repeatedly throughout the day. You should seek shade when your shadow is shorter than you. Protect yourself by wearing long sleeves, pants, a wide-brimmed hat, and sunglasses year round, whenever you are outdoors.  . Once a month, do a whole body skin exam, using a mirror to look at the skin on your back. Tell your health care provider of new moles, moles that have irregular borders, moles that are larger than a pencil eraser, or moles that have changed in shape or color.

## 2016-08-27 NOTE — Pre-Procedure Instructions (Signed)
Kenneth Hester  08/27/2016      [facility] #[id number] Kenneth Hester [address] [place] [address] Phone: [phone] Fax: [phone]    Your procedure is scheduled on January 15  Report to [facility] at [place].M.  Call this number if you have problems the morning of surgery:  [phone]   Remember:  Do not eat food or drink liquids after midnight.   Take these medicines the morning of surgery with A SIP OF WATER FLOVENT HFA,PROAIR HFA 108  (bring with you the day of surgery,   7 days prior to surgery STOP taking any Aspirin, Aleve, Naproxen, Ibuprofen, Motrin, Advil, Goody's, BC's, all herbal medications, fish oil, and all vitamins    Do not wear jewelry.  Do not wear lotions, powders, or cologne, or deoderant.  Men may shave face and neck.  Do not bring valuables to the hospital.  [facility] is not responsible for any belongings or valuables.  Contacts, dentures or bridgework may not be worn into surgery.  Leave your suitcase in the car.  After surgery it may be brought to your room.  For patients admitted to the hospital, discharge time will be determined by your treatment team.  Patients discharged the day of surgery will not be allowed to drive home.    Special instructions:   - Preparing For Surgery  Before surgery, you can play an important role. Because skin is not sterile, your skin needs to be as free of germs as possible. You can reduce the number of germs on your skin by washing with CHG (chlorahexidine gluconate) Soap before surgery.  CHG is an antiseptic cleaner which kills germs and bonds with the skin to continue killing germs even after washing.  Please do not use if you have an allergy to CHG or antibacterial soaps. If your skin becomes reddened/irritated stop using the CHG.  Do not shave (including legs and underarms) for at least 48 hours prior to first CHG shower. It is OK to shave  your face.  Please follow these instructions carefully.   1. Shower the NIGHT BEFORE SURGERY and the MORNING OF SURGERY with CHG.   2. If you chose to wash your hair, wash your hair first as usual with your normal shampoo.  3. After you shampoo, rinse your hair and body thoroughly to remove the shampoo.  4. Use CHG as you would any other liquid soap. You can apply CHG directly to the skin and wash gently with a scrungie or a clean washcloth.   5. Apply the CHG Soap to your body ONLY FROM THE NECK DOWN.  Do not use on open wounds or open sores. Avoid contact with your eyes, ears, mouth and genitals (private parts). Wash genitals (private parts) with your normal soap.  6. Wash thoroughly, paying special attention to the area where your surgery will be performed.  7. Thoroughly rinse your body with warm water from the neck down.  8. DO NOT shower/wash with your normal soap after using and rinsing off the CHG Soap.  9. Pat yourself dry with a CLEAN TOWEL.   10. Wear CLEAN PAJAMAS   11. Place CLEAN SHEETS on your bed the night of your first shower and DO NOT SLEEP WITH PETS.    Day of Surgery: Do not apply any deodorants/lotions. Please wear clean clothes to the hospital/surgery center.      Please read over the following fact  sheets that you were given.

## 2016-08-28 ENCOUNTER — Telehealth: Payer: Self-pay

## 2016-08-28 LAB — HEPATITIS C ANTIBODY: Hep C Virus Ab: <0.1 s/co ratio (ref 0.0–0.9)

## 2016-08-28 LAB — COMPREHENSIVE METABOLIC PANEL
A/G RATIO: 1.7 (ref 1.2–2.2)
ALBUMIN: 4.7 g/dL (ref 3.6–4.8)
ALT: 25 IU/L (ref 0–44)
AST: 25 IU/L (ref 0–40)
Alkaline Phosphatase: 57 IU/L (ref 39–117)
BILIRUBIN TOTAL: 0.5 mg/dL (ref 0.0–1.2)
BUN/Creatinine Ratio: 16 (ref 10–24)
BUN: 19 mg/dL (ref 8–27)
CHLORIDE: 99 mmol/L (ref 96–106)
CO2: 26 mmol/L (ref 18–29)
Calcium: 9.8 mg/dL (ref 8.6–10.2)
Creatinine, Ser: 1.2 mg/dL (ref 0.76–1.27)
GFR calc non Af Amer: 62 mL/min/{1.73_m2} (ref >59)
GFR, EST AFRICAN AMERICAN: 72 mL/min/{1.73_m2} (ref >59)
Globulin, Total: 2.7 g/dL (ref 1.5–4.5)
Glucose: 98 mg/dL (ref 65–99)
POTASSIUM: 4.2 mmol/L (ref 3.5–5.2)
Sodium: 142 mmol/L (ref 134–144)
TOTAL PROTEIN: 7.4 g/dL (ref 6.0–8.5)

## 2016-08-28 LAB — CBC WITH DIFFERENTIAL/PLATELET
BASOS: 1 %
Basophils Absolute: 0.1 10*3/uL (ref 0.0–0.2)
EOS (ABSOLUTE): 0.3 10*3/uL (ref 0.0–0.4)
Eos: 4 %
HEMOGLOBIN: 16.7 g/dL (ref 13.0–17.7)
Hematocrit: 48.7 % (ref 37.5–51.0)
IMMATURE GRANS (ABS): 0 10*3/uL (ref 0.0–0.1)
Immature Granulocytes: 0 %
LYMPHS: 36 %
Lymphocytes Absolute: 2.6 10*3/uL (ref 0.7–3.1)
MCH: 32.8 pg (ref 26.6–33.0)
MCHC: 34.3 g/dL (ref 31.5–35.7)
MCV: 96 fL (ref 79–97)
MONOCYTES: 9 %
Monocytes Absolute: 0.7 10*3/uL (ref 0.1–0.9)
NEUTROS ABS: 3.6 10*3/uL (ref 1.4–7.0)
Neutrophils: 50 %
Platelets: 182 10*3/uL (ref 150–379)
RBC: 5.09 x10E6/uL (ref 4.14–5.80)
RDW: 13.4 % (ref 12.3–15.4)
WBC: 7.2 10*3/uL (ref 3.4–10.8)

## 2016-08-28 LAB — URINE CULTURE: Culture: NO GROWTH

## 2016-08-28 LAB — LIPID PANEL WITH LDL/HDL RATIO
Cholesterol, Total: 274 mg/dL — ABNORMAL HIGH (ref 100–199)
HDL: 42 mg/dL (ref >39)
LDL Calculated: 183 mg/dL — ABNORMAL HIGH (ref 0–99)
LDl/HDL Ratio: 4.4 ratio units — ABNORMAL HIGH (ref 0.0–3.6)
Triglycerides: 243 mg/dL — ABNORMAL HIGH (ref 0–149)
VLDL CHOLESTEROL CAL: 49 mg/dL — AB (ref 5–40)

## 2016-08-28 LAB — TSH: TSH: 2.39 u[IU]/mL (ref 0.450–4.500)

## 2016-08-28 NOTE — Telephone Encounter (Signed)
-----   Message from Jerrol Banana., MD sent at 08/28/2016  6:49 AM EST ----- Cholesterol up. Due to family history of heart disease would recommend starting Crestor 10 mg daily.
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
## 2016-09-07 ENCOUNTER — Inpatient Hospital Stay (HOSPITAL_COMMUNITY)
Admission: RE | Admit: 2016-09-07 | Discharge: 2016-09-08 | DRG: 470 | Disposition: A | Discharge status: Home Health | Payer: PPO | Source: Ambulatory Visit | Attending: Orthopedic Surgery | Admitting: Orthopedic Surgery

## 2016-09-07 ENCOUNTER — Inpatient Hospital Stay (HOSPITAL_COMMUNITY): Payer: PPO | Admitting: Certified Registered Nurse Anesthetist

## 2016-09-07 ENCOUNTER — Encounter (HOSPITAL_COMMUNITY)
Admission: RE | Disposition: A | Discharge status: Home Health | Payer: Self-pay | Source: Ambulatory Visit | Attending: Orthopedic Surgery

## 2016-09-07 ENCOUNTER — Encounter (HOSPITAL_COMMUNITY): Payer: Self-pay | Admitting: *Deleted

## 2016-09-07 DIAGNOSIS — Z881 Allergy status to other antibiotic agents status: Secondary | ICD-10-CM | POA: Diagnosis not present

## 2016-09-07 DIAGNOSIS — M1712 Unilateral primary osteoarthritis, left knee: Secondary | ICD-10-CM | POA: Diagnosis not present

## 2016-09-07 DIAGNOSIS — Z96651 Presence of right artificial knee joint: Secondary | ICD-10-CM | POA: Diagnosis present

## 2016-09-07 DIAGNOSIS — K219 Gastro-esophageal reflux disease without esophagitis: Secondary | ICD-10-CM | POA: Diagnosis not present

## 2016-09-07 DIAGNOSIS — M25762 Osteophyte, left knee: Secondary | ICD-10-CM | POA: Diagnosis present

## 2016-09-07 DIAGNOSIS — G8918 Other acute postprocedural pain: Secondary | ICD-10-CM | POA: Diagnosis not present

## 2016-09-07 DIAGNOSIS — Z87891 Personal history of nicotine dependence: Secondary | ICD-10-CM | POA: Diagnosis not present

## 2016-09-07 DIAGNOSIS — Z888 Allergy status to other drugs, medicaments and biological substances status: Secondary | ICD-10-CM | POA: Diagnosis not present

## 2016-09-07 DIAGNOSIS — Z8249 Family history of ischemic heart disease and other diseases of the circulatory system: Secondary | ICD-10-CM

## 2016-09-07 DIAGNOSIS — Z833 Family history of diabetes mellitus: Secondary | ICD-10-CM | POA: Diagnosis not present

## 2016-09-07 DIAGNOSIS — J45909 Unspecified asthma, uncomplicated: Secondary | ICD-10-CM | POA: Diagnosis present

## 2016-09-07 DIAGNOSIS — G4730 Sleep apnea, unspecified: Secondary | ICD-10-CM | POA: Diagnosis present

## 2016-09-07 DIAGNOSIS — Z6838 Body mass index (BMI) 38.0-38.9, adult: Secondary | ICD-10-CM

## 2016-09-07 DIAGNOSIS — I10 Essential (primary) hypertension: Secondary | ICD-10-CM | POA: Diagnosis not present

## 2016-09-07 DIAGNOSIS — M25562 Pain in left knee: Secondary | ICD-10-CM | POA: Diagnosis not present

## 2016-09-07 HISTORY — PX: TOTAL KNEE ARTHROPLASTY: SHX125

## 2016-09-07 HISTORY — DX: Unilateral primary osteoarthritis, left knee: M17.12

## 2016-09-07 SURGERY — ARTHROPLASTY, KNEE, TOTAL
Anesthesia: Spinal | Laterality: Left

## 2016-09-07 MED ORDER — PHENYLEPHRINE 40 MCG/ML (10ML) SYRINGE FOR IV PUSH (FOR BLOOD PRESSURE SUPPORT)
PREFILLED_SYRINGE | INTRAVENOUS | Status: AC
  Filled 2016-09-07: qty 10

## 2016-09-07 MED ORDER — FENTANYL CITRATE (PF) 100 MCG/2ML IJ SOLN: 100.0000 ug | Freq: Once | INTRAMUSCULAR | Status: DC

## 2016-09-07 MED ORDER — CEFAZOLIN SODIUM-DEXTROSE 2-4 GM/100ML-% IV SOLN
2.0000 g | Freq: Four times a day (QID) | INTRAVENOUS | Status: AC
  Administered 2016-09-07 (×2): 2 g via INTRAVENOUS
  Filled 2016-09-07 (×3): qty 100

## 2016-09-07 MED ORDER — PHENOL 1.4 % MT LIQD: 1.0000 | OROMUCOSAL | Status: DC | PRN

## 2016-09-07 MED ORDER — MEPERIDINE HCL 25 MG/ML IJ SOLN: 6.2500 mg | INTRAMUSCULAR | Status: DC | PRN

## 2016-09-07 MED ORDER — 0.9 % SODIUM CHLORIDE (POUR BTL) OPTIME
TOPICAL | Status: DC | PRN
  Administered 2016-09-07: 1000 mL

## 2016-09-07 MED ORDER — METOCLOPRAMIDE HCL 5 MG PO TABS: 5.0000 mg | ORAL_TABLET | Freq: Three times a day (TID) | ORAL | Status: DC | PRN

## 2016-09-07 MED ORDER — PHENYLEPHRINE HCL 10 MG/ML IJ SOLN
INTRAMUSCULAR | Status: DC | PRN
  Administered 2016-09-07: 80 ug via INTRAVENOUS

## 2016-09-07 MED ORDER — HYDROMORPHONE HCL 1 MG/ML IJ SOLN: 0.2500 mg | INTRAMUSCULAR | Status: DC | PRN

## 2016-09-07 MED ORDER — CEFAZOLIN SODIUM-DEXTROSE 2-3 GM-% IV SOLR
INTRAVENOUS | Status: DC | PRN
  Administered 2016-09-07: 2 g via INTRAVENOUS

## 2016-09-07 MED ORDER — MIDAZOLAM HCL 2 MG/2ML IJ SOLN: 2.0000 mg | Freq: Once | INTRAMUSCULAR | Status: DC

## 2016-09-07 MED ORDER — LACTATED RINGERS IV SOLN: INTRAVENOUS | Status: DC

## 2016-09-07 MED ORDER — FENTANYL CITRATE (PF) 100 MCG/2ML IJ SOLN
INTRAMUSCULAR | Status: DC | PRN
  Administered 2016-09-07: 50 ug via INTRAVENOUS
  Administered 2016-09-07 (×2): 25 ug via INTRAVENOUS

## 2016-09-07 MED ORDER — ALUM & MAG HYDROXIDE-SIMETH 200-200-20 MG/5ML PO SUSP: 30.0000 mL | ORAL | Status: DC | PRN

## 2016-09-07 MED ORDER — PROMETHAZINE HCL 25 MG/ML IJ SOLN: 6.2500 mg | INTRAMUSCULAR | Status: DC | PRN

## 2016-09-07 MED ORDER — PROPOFOL 10 MG/ML IV BOLUS
INTRAVENOUS | Status: DC | PRN
  Administered 2016-09-07: 10 mg via INTRAVENOUS
  Administered 2016-09-07: 20 mg via INTRAVENOUS

## 2016-09-07 MED ORDER — ACETAMINOPHEN 500 MG PO TABS
1000.0000 mg | ORAL_TABLET | Freq: Four times a day (QID) | ORAL | Status: AC
  Administered 2016-09-07 – 2016-09-08 (×4): 1000 mg via ORAL
  Filled 2016-09-07 (×4): qty 2

## 2016-09-07 MED ORDER — ACETAMINOPHEN 325 MG PO TABS: 650.0000 mg | ORAL_TABLET | Freq: Four times a day (QID) | ORAL | Status: DC | PRN

## 2016-09-07 MED ORDER — METOCLOPRAMIDE HCL 5 MG/ML IJ SOLN: 5.0000 mg | Freq: Three times a day (TID) | INTRAMUSCULAR | Status: DC | PRN

## 2016-09-07 MED ORDER — FENTANYL CITRATE (PF) 100 MCG/2ML IJ SOLN
INTRAMUSCULAR | Status: AC
  Filled 2016-09-07: qty 2

## 2016-09-07 MED ORDER — DEXAMETHASONE SODIUM PHOSPHATE 10 MG/ML IJ SOLN
INTRAMUSCULAR | Status: DC | PRN
  Administered 2016-09-07: 10 mg via INTRAVENOUS

## 2016-09-07 MED ORDER — OXYCODONE HCL 5 MG PO TABS
5.0000 mg | ORAL_TABLET | ORAL | Status: DC | PRN
  Administered 2016-09-07 (×3): 10 mg via ORAL
  Filled 2016-09-07 (×3): qty 2

## 2016-09-07 MED ORDER — ONDANSETRON HCL 4 MG/2ML IJ SOLN
INTRAMUSCULAR | Status: DC | PRN
  Administered 2016-09-07: 4 mg via INTRAVENOUS

## 2016-09-07 MED ORDER — CELECOXIB 200 MG PO CAPS
200.0000 mg | ORAL_CAPSULE | Freq: Two times a day (BID) | ORAL | Status: DC
  Administered 2016-09-07 – 2016-09-08 (×3): 200 mg via ORAL
  Filled 2016-09-07 (×3): qty 1

## 2016-09-07 MED ORDER — PROPOFOL 500 MG/50ML IV EMUL
INTRAVENOUS | Status: DC | PRN
  Administered 2016-09-07: 75 ug/kg/min via INTRAVENOUS

## 2016-09-07 MED ORDER — ONDANSETRON HCL 4 MG/2ML IJ SOLN
INTRAMUSCULAR | Status: AC
  Filled 2016-09-07: qty 2

## 2016-09-07 MED ORDER — BUPIVACAINE-EPINEPHRINE (PF) 0.5% -1:200000 IJ SOLN
INTRAMUSCULAR | Status: DC | PRN
  Administered 2016-09-07: 30 mL via PERINEURAL

## 2016-09-07 MED ORDER — MIDAZOLAM HCL 2 MG/2ML IJ SOLN
INTRAMUSCULAR | Status: AC
  Filled 2016-09-07: qty 2

## 2016-09-07 MED ORDER — ONDANSETRON HCL 4 MG PO TABS: 4.0000 mg | ORAL_TABLET | Freq: Four times a day (QID) | ORAL | Status: DC | PRN

## 2016-09-07 MED ORDER — DEXAMETHASONE SODIUM PHOSPHATE 10 MG/ML IJ SOLN
10.0000 mg | Freq: Three times a day (TID) | INTRAMUSCULAR | Status: DC
  Administered 2016-09-07 – 2016-09-08 (×3): 10 mg via INTRAVENOUS
  Filled 2016-09-07 (×3): qty 1

## 2016-09-07 MED ORDER — CHLORHEXIDINE GLUCONATE 4 % EX LIQD: 60.0000 mL | Freq: Once | CUTANEOUS | Status: DC

## 2016-09-07 MED ORDER — ALBUTEROL SULFATE (2.5 MG/3ML) 0.083% IN NEBU: 2.5000 mg | INHALATION_SOLUTION | Freq: Four times a day (QID) | RESPIRATORY_TRACT | Status: DC | PRN

## 2016-09-07 MED ORDER — ASPIRIN EC 325 MG PO TBEC
325.0000 mg | DELAYED_RELEASE_TABLET | Freq: Every day | ORAL | Status: DC
  Administered 2016-09-08: 325 mg via ORAL
  Filled 2016-09-07: qty 1

## 2016-09-07 MED ORDER — ACETAMINOPHEN 650 MG RE SUPP: 650.0000 mg | Freq: Four times a day (QID) | RECTAL | Status: DC | PRN

## 2016-09-07 MED ORDER — MIDAZOLAM HCL 5 MG/5ML IJ SOLN
INTRAMUSCULAR | Status: DC | PRN
  Administered 2016-09-07 (×2): 1 mg via INTRAVENOUS

## 2016-09-07 MED ORDER — DIPHENHYDRAMINE HCL 12.5 MG/5ML PO ELIX: 12.5000 mg | ORAL_SOLUTION | ORAL | Status: DC | PRN

## 2016-09-07 MED ORDER — DOCUSATE SODIUM 100 MG PO CAPS
100.0000 mg | ORAL_CAPSULE | Freq: Two times a day (BID) | ORAL | Status: DC
  Administered 2016-09-07 – 2016-09-08 (×3): 100 mg via ORAL
  Filled 2016-09-07 (×3): qty 1

## 2016-09-07 MED ORDER — ONDANSETRON HCL 4 MG/2ML IJ SOLN: 4.0000 mg | Freq: Four times a day (QID) | INTRAMUSCULAR | Status: DC | PRN

## 2016-09-07 MED ORDER — BUPIVACAINE-EPINEPHRINE 0.25% -1:200000 IJ SOLN
INTRAMUSCULAR | Status: DC | PRN
  Administered 2016-09-07: 30 mL

## 2016-09-07 MED ORDER — MIDAZOLAM HCL 2 MG/2ML IJ SOLN
INTRAMUSCULAR | Status: DC
Start: 2016-09-07 — End: 2016-09-07
  Filled 2016-09-07: qty 2

## 2016-09-07 MED ORDER — POLYETHYLENE GLYCOL 3350 17 G PO PACK
17.0000 g | PACK | Freq: Two times a day (BID) | ORAL | Status: DC
  Administered 2016-09-07 – 2016-09-08 (×3): 17 g via ORAL
  Filled 2016-09-07 (×3): qty 1

## 2016-09-07 MED ORDER — BUDESONIDE 0.5 MG/2ML IN SUSP
1.0000 mg | Freq: Two times a day (BID) | RESPIRATORY_TRACT | Status: DC
  Administered 2016-09-07 – 2016-09-08 (×2): 1 mg via RESPIRATORY_TRACT
  Filled 2016-09-07 (×3): qty 4

## 2016-09-07 MED ORDER — KETOROLAC TROMETHAMINE 30 MG/ML IJ SOLN: 30.0000 mg | Freq: Once | INTRAMUSCULAR | Status: DC

## 2016-09-07 MED ORDER — LACTATED RINGERS IV SOLN
INTRAVENOUS | Status: DC | PRN
  Administered 2016-09-07 (×2): via INTRAVENOUS

## 2016-09-07 MED ORDER — POVIDONE-IODINE 7.5 % EX SOLN
Freq: Once | CUTANEOUS | Status: DC
  Filled 2016-09-07: qty 118

## 2016-09-07 MED ORDER — POTASSIUM CHLORIDE IN NACL 20-0.9 MEQ/L-% IV SOLN
INTRAVENOUS | Status: DC
  Administered 2016-09-07: 16:00:00 via INTRAVENOUS
  Filled 2016-09-07: qty 1000

## 2016-09-07 MED ORDER — SODIUM CHLORIDE 0.9 % IR SOLN
Status: DC | PRN
  Administered 2016-09-07: 3000 mL

## 2016-09-07 MED ORDER — CEFAZOLIN SODIUM-DEXTROSE 2-4 GM/100ML-% IV SOLN
2.0000 g | INTRAVENOUS | Status: DC
  Filled 2016-09-07: qty 100

## 2016-09-07 MED ORDER — HYDROMORPHONE HCL 2 MG/ML IJ SOLN: 1.0000 mg | INTRAMUSCULAR | Status: DC | PRN

## 2016-09-07 MED ORDER — DEXAMETHASONE SODIUM PHOSPHATE 10 MG/ML IJ SOLN
INTRAMUSCULAR | Status: AC
  Filled 2016-09-07: qty 1

## 2016-09-07 MED ORDER — PROPOFOL 10 MG/ML IV BOLUS
INTRAVENOUS | Status: AC
  Filled 2016-09-07: qty 20

## 2016-09-07 MED ORDER — MENTHOL 3 MG MT LOZG: 1.0000 | LOZENGE | OROMUCOSAL | Status: DC | PRN

## 2016-09-07 SURGICAL SUPPLY — 69 items
BANDAGE ESMARK 6X9 LF (GAUZE/BANDAGES/DRESSINGS) ×1 IMPLANT
BENZOIN TINCTURE PRP APPL 2/3 (GAUZE/BANDAGES/DRESSINGS) ×3 IMPLANT
BLADE SAGITTAL 25.0X1.19X90 (BLADE) ×2 IMPLANT
BLADE SAGITTAL 25.0X1.19X90MM (BLADE) ×1
BLADE SAW SGTL 13X75X1.27 (BLADE) ×3 IMPLANT
BLADE SURG 10 STRL SS (BLADE) ×6 IMPLANT
BNDG ELASTIC 6X15 VLCR STRL LF (GAUZE/BANDAGES/DRESSINGS) ×3 IMPLANT
BNDG ESMARK 6X9 LF (GAUZE/BANDAGES/DRESSINGS) ×3
BOWL SMART MIX CTS (DISPOSABLE) ×3 IMPLANT
CAPT KNEE TOTAL 3 ATTUNE ×3 IMPLANT
CEMENT HV SMART SET (Cement) ×6 IMPLANT
CLOSURE WOUND 1/2 X4 (GAUZE/BANDAGES/DRESSINGS) ×1
COVER SURGICAL LIGHT HANDLE (MISCELLANEOUS) ×3 IMPLANT
CUFF TOURNIQUET SINGLE 34IN LL (TOURNIQUET CUFF) ×3 IMPLANT
CUFF TOURNIQUET SINGLE 44IN (TOURNIQUET CUFF) IMPLANT
DECANTER SPIKE VIAL GLASS SM (MISCELLANEOUS) IMPLANT
DRAPE EXTREMITY T 121X128X90 (DRAPE) ×3 IMPLANT
DRAPE HALF SHEET 40X57 (DRAPES) ×3 IMPLANT
DRAPE INCISE IOBAN 66X45 STRL (DRAPES) IMPLANT
DRAPE PROXIMA HALF (DRAPES) IMPLANT
DRAPE U-SHAPE 47X51 STRL (DRAPES) ×3 IMPLANT
DRSG AQUACEL AG ADV 3.5X14 (GAUZE/BANDAGES/DRESSINGS) ×3 IMPLANT
DURAPREP 26ML APPLICATOR (WOUND CARE) ×6 IMPLANT
ELECT CAUTERY BLADE 6.4 (BLADE) ×3 IMPLANT
ELECT REM PT RETURN 9FT ADLT (ELECTROSURGICAL) ×3
ELECTRODE REM PT RTRN 9FT ADLT (ELECTROSURGICAL) ×1 IMPLANT
FACESHIELD WRAPAROUND (MASK) ×3 IMPLANT
GLOVE BIO SURGEON STRL SZ7 (GLOVE) ×3 IMPLANT
GLOVE BIOGEL PI IND STRL 7.0 (GLOVE) ×1 IMPLANT
GLOVE BIOGEL PI IND STRL 7.5 (GLOVE) ×1 IMPLANT
GLOVE BIOGEL PI INDICATOR 7.0 (GLOVE) ×2
GLOVE BIOGEL PI INDICATOR 7.5 (GLOVE) ×2
GLOVE SS BIOGEL STRL SZ 7.5 (GLOVE) ×1 IMPLANT
GLOVE SUPERSENSE BIOGEL SZ 7.5 (GLOVE) ×2
GOWN STRL REUS W/ TWL LRG LVL3 (GOWN DISPOSABLE) ×1 IMPLANT
GOWN STRL REUS W/ TWL XL LVL3 (GOWN DISPOSABLE) ×2 IMPLANT
GOWN STRL REUS W/TWL LRG LVL3 (GOWN DISPOSABLE) ×2
GOWN STRL REUS W/TWL XL LVL3 (GOWN DISPOSABLE) ×4
HANDPIECE INTERPULSE COAX TIP (DISPOSABLE) ×2
HOOD PEEL AWAY FACE SHEILD DIS (HOOD) ×6 IMPLANT
IMMOBILIZER KNEE 22 UNIV (SOFTGOODS) ×3 IMPLANT
KIT BASIN OR (CUSTOM PROCEDURE TRAY) ×3 IMPLANT
KIT ROOM TURNOVER OR (KITS) ×3 IMPLANT
MANIFOLD NEPTUNE II (INSTRUMENTS) ×3 IMPLANT
MARKER SKIN DUAL TIP RULER LAB (MISCELLANEOUS) ×3 IMPLANT
NEEDLE 18GX1X1/2 (RX/OR ONLY) (NEEDLE) ×3 IMPLANT
NS IRRIG 1000ML POUR BTL (IV SOLUTION) ×3 IMPLANT
PACK TOTAL JOINT (CUSTOM PROCEDURE TRAY) ×3 IMPLANT
PAD ARMBOARD 7.5X6 YLW CONV (MISCELLANEOUS) ×6 IMPLANT
SET HNDPC FAN SPRY TIP SCT (DISPOSABLE) ×1 IMPLANT
STRIP CLOSURE SKIN 1/2X4 (GAUZE/BANDAGES/DRESSINGS) ×2 IMPLANT
SUCTION FRAZIER HANDLE 10FR (MISCELLANEOUS) ×2
SUCTION TUBE FRAZIER 10FR DISP (MISCELLANEOUS) ×1 IMPLANT
SUT MNCRL AB 3-0 PS2 18 (SUTURE) ×3 IMPLANT
SUT VIC AB 0 CT1 27 (SUTURE) ×4
SUT VIC AB 0 CT1 27XBRD ANBCTR (SUTURE) ×2 IMPLANT
SUT VIC AB 1 CT1 27 (SUTURE) ×2
SUT VIC AB 1 CT1 27XBRD ANBCTR (SUTURE) ×1 IMPLANT
SUT VIC AB 2-0 CT1 27 (SUTURE) ×4
SUT VIC AB 2-0 CT1 TAPERPNT 27 (SUTURE) ×2 IMPLANT
SYR 30ML LL (SYRINGE) ×3 IMPLANT
TOWEL OR 17X24 6PK STRL BLUE (TOWEL DISPOSABLE) ×3 IMPLANT
TOWEL OR 17X26 10 PK STRL BLUE (TOWEL DISPOSABLE) ×3 IMPLANT
TRAY CATH 16FR W/PLASTIC CATH (SET/KITS/TRAYS/PACK) IMPLANT
TRAY FOLEY CATH 14FR (SET/KITS/TRAYS/PACK) ×3 IMPLANT
TRAY FOLEY CATH 16FR SILVER (SET/KITS/TRAYS/PACK) IMPLANT
TUBE CONNECTING 12'X1/4 (SUCTIONS)
TUBE CONNECTING 12X1/4 (SUCTIONS) IMPLANT
YANKAUER SUCT BULB TIP NO VENT (SUCTIONS) ×3 IMPLANT

## 2016-09-07 NOTE — Transfer of Care (Signed)
Immediate Anesthesia Transfer of Care Note  Patient: Kenneth Hester  Procedure(s) Performed: Procedure(s): TOTAL KNEE ARTHROPLASTY (Left)  Patient Location: PACU  Anesthesia Type:Spinal and MAC combined with regional for post-op pain  Level of Consciousness: awake, alert  and oriented  Airway & Oxygen Therapy: Patient Spontanous Breathing and Patient connected to nasal cannula oxygen  Post-op Assessment: Report given to RN and Post -op Vital signs reviewed and stable  Post vital signs: Reviewed and stable  Last Vitals:  Vitals:   08/26/16 1600 09/07/16 0859  BP: 133/72 (!) 165/76  Pulse: 87 68  Resp:  18  Temp: 36.6 C 36.6 C    Last Pain:  Vitals:   09/07/16 0859  TempSrc: Oral         Complications: No apparent anesthesia complications

## 2016-09-07 NOTE — Anesthesia Postprocedure Evaluation (Addendum)
Anesthesia Post Note  Patient: Kenneth Hester  Procedure(s) Performed: Procedure(s) (LRB): TOTAL KNEE ARTHROPLASTY (Left)  Patient location during evaluation: PACU Anesthesia Type: Spinal Level of consciousness: awake Pain management: pain level controlled Vital Signs Assessment: post-procedure vital signs reviewed and stable Respiratory status: spontaneous breathing Cardiovascular status: stable Postop Assessment: no headache, no backache, spinal receding, patient able to bend at knees and no signs of nausea or vomiting Anesthetic complications: no        Last Vitals:  Vitals:   09/07/16 1422 09/07/16 1437  BP: (!) 130/56 (!) 127/59  Pulse: 79 76  Resp: 19 17  Temp:      Last Pain:  Vitals:   09/07/16 1425  TempSrc:   PainSc: 0-No pain   Pain Goal:                 Kenneth Hester,Kenneth Hester

## 2016-09-07 NOTE — Anesthesia Procedure Notes (Signed)
Spinal  Patient location during procedure: OR Start time: 09/07/2016 11:35 AM End time: 09/07/2016 11:40 AM Staffing Anesthesiologist: Lyn Hollingshead Performed: anesthesiologist  Preanesthetic Checklist Completed: patient identified, surgical consent, pre-op evaluation, timeout performed, IV checked, risks and benefits discussed and monitors and equipment checked Spinal Block Patient position: sitting Prep: site prepped and draped and DuraPrep Patient monitoring: heart rate, cardiac monitor, continuous pulse ox and blood pressure Approach: midline Location: L3-4 Injection technique: single-shot Needle Needle type: Sprotte  Needle gauge: 24 G Needle length: 9 cm Needle insertion depth: 6 cm Assessment Sensory level: T8

## 2016-09-07 NOTE — Op Note (Signed)
MRN:     [id number] DOB/AGE:    [birth year] / 66 y.o.       OPERATIVE REPORT    DATE OF PROCEDURE:  09/07/2016       PREOPERATIVE DIAGNOSIS:   PRIMARY LOCALIZED OA LEFT KNEE      Estimated body mass index is 37.8 kg/m as calculated from the following:   Height as of 08/27/16: 5\' 9"  (1.753 m).   Weight as of this encounter: 256 lb (116.1 kg).                                                        POSTOPERATIVE DIAGNOSIS:   SAME                                                                       PROCEDURE:  Procedure(s): TOTAL KNEE ARTHROPLASTY Using Depuy Attune RP implants #7 Femur, #8Tibia, 67mm  RP bearing, 35 Patella     SURGEON: [place] Luppino A    ASSISTANT:  Kirstin Shepperson PA-C   (Present and scrubbed throughout the case, critical for assistance with exposure, retraction, instrumentation, and closure.)         ANESTHESIA: Spinal with Adductor Nerve Block     TOURNIQUET TIME: [id number]   COMPLICATIONS:  None     SPECIMENS: None   INDICATIONS FOR PROCEDURE: The patient has  DJD LEFT KNEE, varus deformities, XR shows bone on bone arthritis. Patient has failed all conservative measures including anti-inflammatory medicines, narcotics, attempts at  exercise and weight loss, cortisone injections and viscosupplementation.  Risks and benefits of surgery have been discussed, questions answered.   DESCRIPTION OF PROCEDURE: The patient identified by armband, received  right femoral nerve block and IV antibiotics, in the holding area at [facility]. Patient taken to the operating room, appropriate anesthetic  monitors were attached Spinal anesthesia induced with  the patient in supine position, Foley catheter was inserted. Tourniquet  applied high to the operative thigh. Lateral post and foot positioner  applied to the table, the lower extremity was then prepped and draped  in usual sterile fashion from the ankle to the tourniquet. Time-out procedure was performed. The limb was  wrapped with an Esmarch bandage and the tourniquet inflated to 365 mmHg. We began the operation by making the anterior midline incision starting at handbreadth above the patella going over the patella 1 cm medial to and  4 cm distal to the tibial tubercle. Small bleeders in the skin and the  subcutaneous tissue identified and cauterized. Transverse retinaculum was incised and reflected medially and a medial parapatellar arthrotomy was accomplished. the patella was everted and theprepatellar fat pad resected. The superficial medial collateral  ligament was then elevated from anterior to posterior along the proximal  flare of the tibia and anterior half of the menisci resected. The knee was hyperflexed exposing bone on bone arthritis. Peripheral and notch osteophytes as well as the cruciate ligaments were then resected. We continued to  work our way around posteriorly along the proximal tibia, and externally  rotated the tibia subluxing it out  from underneath the femur. A McHale  retractor was placed through the notch and a lateral Hohmann retractor  placed, and we then drilled through the proximal tibia in line with the  axis of the tibia followed by an intramedullary guide rod and 2-degree  posterior slope cutting guide. The tibial cutting guide was pinned into place  allowing resection of 4 mm of bone medially and about 6 mm of bone  laterally because of her varus deformity. Satisfied with the tibial resection, we then  entered the distal femur 2 mm anterior to the PCL origin with the  intramedullary guide rod and applied the distal femoral cutting guide  set at 20mm, with 5 degrees of valgus. This was pinned along the  epicondylar axis. At this point, the distal femoral cut was accomplished without difficulty. We then sized for a #7 femoral component and pinned the guide in 3 degrees of external rotation.The chamfer cutting guide was pinned into place. The anterior, posterior, and chamfer cuts were  accomplished without difficulty followed by  the  RP box cutting guide and the box cut. We also removed posterior osteophytes from the posterior femoral condyles. At this  time, the knee was brought into full extension. We checked our  extension and flexion gaps and found them symmetric at 62mm.  The patella thickness measured at 25 mm. We set the cutting guide at 15 and removed the posterior 9.5-10 mm  of the patella sized for 35 button and drilled the lollipop. The knee  was then once again hyperflexed exposing the proximal tibia. We sized for a #8 tibial base plate, applied the smokestack and the conical reamer followed by the the Delta fin keel punch. We then hammered into place the  RP trial femoral component, inserted a 1 trial bearing, trial patellar button, and took the knee through range of motion from 0-130 degrees. No thumb pressure was required for patellar  tracking. At this point, all trial components were removed, a double batch of DePuy HV cement  was mixed and applied to all bony metallic mating surfaces except for the posterior condyles of the femur itself. In order, we  hammered into place the tibial tray and removed excess cement, the femoral component and removed excess cement, a 41mm  RP bearing  was inserted, and the knee brought to full extension with compression.  The patellar button was clamped into place, and excess cement  removed. While the cement cured the wound was irrigated out with normal saline solution pulse lavage.. Ligament stability and patellar tracking were checked and found to be excellent.. The parapatellar arthrotomy was closed with  #1 Vicryl suture. The subcutaneous tissue with 0 and 2-0 undyed  Vicryl suture, and 4-0 Monocryl.. A dressing of Aquaseal,  4 x 4, dressing sponges, Webril, and Ace wrap applied. Needle and sponge count were correct times 2.The patient awakened, extubated, and taken to recovery room without difficulty. Vascular status was normal,  pulses 2+ and symmetric.   Kenneth Hester A 09/07/2016, 1:14 PM

## 2016-09-07 NOTE — Anesthesia Procedure Notes (Addendum)
Anesthesia Regional Block:  Adductor canal block  Pre-Anesthetic Checklist: ,, timeout performed, Correct Patient, Correct Site, Correct Laterality, Correct Procedure, Correct Position, site marked, Risks and benefits discussed,  Surgical consent,  Pre-op evaluation,  At surgeon's request and post-op pain management  Laterality: Left and Lower  Prep: chloraprep       Needles:  Injection technique: Single-shot  Needle Type: Echogenic Stimulator Needle     Needle Length: 9cm 9 cm Needle Gauge: 21 and 21 G  Needle insertion depth: 5 cm   Additional Needles:  Procedures: ultrasound guided (picture in chart) Adductor canal block Narrative:  Start time: 09/07/2016 11:00 AM End time: 09/07/2016 11:08 AM Injection made incrementally with aspirations every 5 mL. Anesthesiologist: Lyn Hollingshead

## 2016-09-07 NOTE — Anesthesia Preprocedure Evaluation (Signed)
Anesthesia Evaluation  Patient identified by MRN, date of birth, ID band Patient awake    Reviewed: Allergy & Precautions, H&P , NPO status , Patient's Chart, lab work & pertinent test results  Airway Mallampati: II  TM Distance: >3 FB Neck ROM: full    Dental no notable dental hx. (+) Teeth Intact   Pulmonary asthma , sleep apnea and Continuous Positive Airway Pressure Ventilation , former smoker,    Pulmonary exam normal        Cardiovascular hypertension, Pt. on medications Normal cardiovascular exam     Neuro/Psych negative neurological ROS  negative psych ROS   GI/Hepatic Neg liver ROS,   Endo/Other  negative endocrine ROS  Renal/GU negative Renal ROS  negative genitourinary   Musculoskeletal   Abdominal (+) + obese,   Peds negative pediatric ROS (+)  Hematology negative hematology ROS (+)   Anesthesia Other Findings   Reproductive/Obstetrics negative OB ROS                             Anesthesia Physical  Anesthesia Plan  ASA: II  Anesthesia Plan: Spinal   Post-op Pain Management:  Regional for Post-op pain   Induction:   Airway Management Planned: Simple Face Mask  Additional Equipment:   Intra-op Plan:   Post-operative Plan:   Informed Consent: I have reviewed the patients History and Physical, chart, labs and discussed the procedure including the risks, benefits and alternatives for the proposed anesthesia with the patient or authorized representative who has indicated his/her understanding and acceptance.     Plan Discussed with: CRNA and Surgeon  Anesthesia Plan Comments: (Adductor canal block)        Anesthesia Quick Evaluation

## 2016-09-07 NOTE — Progress Notes (Signed)
Orthopedic Tech Progress Note Patient Details:  Kenneth Hester [birth year] [id number]  CPM Left Knee CPM Left Knee: On Left Knee Flexion (Degrees): 90 Left Knee Extension (Degrees): 0 Additional Comments: trapeze bar patient helper   [name] 09/07/2016, 2:22 PM Viewed order from doctor's order list

## 2016-09-08 ENCOUNTER — Encounter (HOSPITAL_COMMUNITY): Payer: Self-pay | Admitting: Orthopedic Surgery

## 2016-09-08 LAB — CBC
HEMATOCRIT: 41.4 % (ref 39.0–52.0)
HEMOGLOBIN: 14.3 g/dL (ref 13.0–17.0)
MCH: 32.7 pg (ref 26.0–34.0)
MCHC: 34.5 g/dL (ref 30.0–36.0)
MCV: 94.7 fL (ref 78.0–100.0)
Platelets: 162 10*3/uL (ref 150–400)
RBC: 4.37 MIL/uL (ref 4.22–5.81)
RDW: 12.9 % (ref 11.5–15.5)
WBC: 14.9 10*3/uL — ABNORMAL HIGH (ref 4.0–10.5)

## 2016-09-08 LAB — BASIC METABOLIC PANEL
Anion gap: 10 (ref 5–15)
BUN: 17 mg/dL (ref 6–20)
CHLORIDE: 101 mmol/L (ref 101–111)
CO2: 25 mmol/L (ref 22–32)
CREATININE: 1.21 mg/dL (ref 0.61–1.24)
Calcium: 8.9 mg/dL (ref 8.9–10.3)
GFR calc non Af Amer: >60 mL/min (ref >60)
GLUCOSE: 167 mg/dL — AB (ref 65–99)
Potassium: 4.2 mmol/L (ref 3.5–5.1)
Sodium: 136 mmol/L (ref 135–145)

## 2016-09-08 MED ORDER — ACETAMINOPHEN 325 MG PO TABS: 650.0000 mg | ORAL_TABLET | Freq: Four times a day (QID) | ORAL | Status: DC | PRN

## 2016-09-08 MED ORDER — POLYETHYLENE GLYCOL 3350 17 G PO PACK: PACK | ORAL | 0 refills | Status: DC

## 2016-09-08 MED ORDER — ASPIRIN 325 MG PO TBEC: DELAYED_RELEASE_TABLET | ORAL | 0 refills | Status: DC

## 2016-09-08 MED ORDER — DOCUSATE SODIUM 100 MG PO CAPS: ORAL_CAPSULE | ORAL | 0 refills | Status: DC

## 2016-09-08 MED ORDER — OXYCODONE HCL 5 MG PO TABS: ORAL_TABLET | ORAL | 0 refills | Status: DC

## 2016-09-08 NOTE — Care Management Note (Signed)
Case Management Note  Patient Details  Name: SOFIA JOHNSON MRN: [id number] Date of Birth: 06-01-1951  Subjective/Objective:   66 yr old gentleman s/p left total knee arthroplasty.                Action/Plan: Case manager spoke with patient and wife concerning [place] and DME. Patient was preoperatively setup with [facility], no changes. Patient states he has rolling walker, will not use 3in1. He will have family support at discharge.   Expected Discharge Date:  09/08/16               Expected Discharge Plan:  [place]  In-House Referral:  NA  Discharge planning Services  CM Consult  Post Acute Care Choice:  Home Health, Durable Medical Equipment Choice offered to:  Patient  DME Arranged:  N/A DME Agency:     HH Arranged:    [place] Agency:  [facility] (formerly [employer])  Status of Service:  Completed, signed off  If discussed at [employer], dates discussed:    Additional Comments:  Ninfa Meeker, RN 09/08/2016, 11:15 AM

## 2016-09-08 NOTE — Progress Notes (Signed)
Reviewed discharge papers and medications with pt and husband with full understanding, IV discontinued

## 2016-09-08 NOTE — Evaluation (Signed)
Physical Therapy Evaluation Patient Details Name: Kenneth Hester MRN: [id number] DOB: [birth year] Today's Date: 09/08/2016   History of Present Illness  Admitted for LTKA, WBAT, KI;  has a past medical history of Arthritis;  Hypertension;  Primary localized osteoarthritis of right knee;  has a pertinent past surgical history that includes Skin split graft; Knee surgery (Right); Skin graft;  Total knee arthroplasty (Right, 10/07/2015);  Clinical Impression   Pt is s/p TKA resulting in the deficits listed below (see PT Problem List). Overall moving quite well; OK for dc home from PT standpoint;  Pt will benefit from skilled PT to increase their independence and safety with mobility to allow discharge to the venue listed below.      Follow Up Recommendations Home health PT;Outpatient PT    Equipment Recommendations  None recommended by PT    Recommendations for Other Services       Precautions / Restrictions Precautions Precautions: Knee Precaution Booklet Issued: Yes (comment) Precaution Comments: Pt educated to not allow any pillow or bolster under knee for healing with optimal range of motion.  Restrictions Weight Bearing Restrictions: Yes LLE Weight Bearing: Weight bearing as tolerated      Mobility  Bed Mobility Overal bed mobility: Needs Assistance Bed Mobility: Supine to Sit     Supine to sit: Supervision     General bed mobility comments: Dependent on momentum  Transfers Overall transfer level: Needs assistance Equipment used: Rolling walker (2 wheeled) Transfers: Sit to/from Stand Sit to Stand: Supervision         General transfer comment: Cues to allow L knee to bend generously during sit<>stand transitions  Ambulation/Gait Ambulation/Gait assistance: Modified independent (Device/Increase time);Supervision Ambulation Distance (Feet): 550 Feet Assistive device: Rolling walker (2 wheeled);Straight cane Gait Pattern/deviations: Step-through pattern Gait  velocity: approaching WNL   General Gait Details: Modified independent with RW; Supervision with cane; Nice, stable L knee in stance  Stairs Stairs: Yes Stairs assistance: Supervision Stair Management: One rail Left;Alternating pattern;Forwards Number of Stairs: 5 General stair comments: no difficulty noted, even with ascending leading with LLE  Wheelchair Mobility    Modified Rankin (Stroke Patients Only)       Balance Overall balance assessment: No apparent balance deficits (not formally assessed)                                           Pertinent Vitals/Pain Pain Assessment: 0-10 Pain Score: 2  Pain Location: L knee Pain Descriptors / Indicators: Aching Pain Intervention(s): Monitored during session    Home Living Family/patient expects to be discharged to:: Private residence Living Arrangements: Spouse/significant other Available Help at Discharge: Family;Available 24 hours/day Type of Home: House Home Access: Stairs to enter Entrance Stairs-Rails: Left Entrance Stairs-Number of Steps: 2 Home Layout: One level Home Equipment: Walker - 2 wheels;Cane - single point;Bedside commode      Prior Function Level of Independence: Independent               Hand Dominance   Dominant Hand: Right    Extremity/Trunk Assessment   Upper Extremity Assessment Upper Extremity Assessment: Overall WFL for tasks assessed    Lower Extremity Assessment Lower Extremity Assessment: LLE deficits/detail LLE Deficits / Details: Showing very nice knee control against gravity; able to perform 10 straight leg raises with minimal quad lag; range approx 2-90 deg       Communication  Communication: No difficulties  Cognition Arousal/Alertness: Awake/alert Behavior During Therapy: WFL for tasks assessed/performed Overall Cognitive Status: Within Functional Limits for tasks assessed                      General Comments      Exercises Total  Joint Exercises Quad Sets: AROM;Left;10 reps Heel Slides: AROM;Left;10 reps Straight Leg Raises: AROM;Left;10 reps   Assessment/Plan    PT Assessment Patient needs continued PT services  PT Problem List Decreased strength;Decreased activity tolerance;Pain;Decreased knowledge of precautions          PT Treatment Interventions DME instruction;Gait training;Stair training;Functional mobility training;Therapeutic activities;Therapeutic exercise;Patient/family education    PT Goals (Current goals can be found in the Care Plan section)  Acute Rehab PT Goals Patient Stated Goal: Back to work; be able to walk on uneven surfaces PT Goal Formulation: With patient Time For Goal Achievement: 09/15/16 Potential to Achieve Goals: Good    Frequency 7X/week   Barriers to discharge        Co-evaluation               End of Session Equipment Utilized During Treatment: Gait belt Activity Tolerance: Patient tolerated treatment well Patient left: in chair;with call bell/phone within reach Nurse Communication: Mobility status         Time: [id number] PT Time Calculation (min) (ACUTE ONLY): 37 min   Charges:   PT Evaluation $PT Eval Low Complexity: 1 Procedure PT Treatments $Gait Training: 8-22 mins   PT G Codes:        Colletta [place] 09/08/2016, 11:19 AM  Roney Marion, PT  Acute Rehabilitation Services Pager [phone] Office [phone]

## 2016-09-08 NOTE — Discharge Summary (Signed)
Patient ID: Kenneth Hester MRN: [id number] DOB/AGE: [birth year] 66 y.o.  Admit date: 09/07/2016 Discharge date: 09/08/2016  Admission Diagnoses:  Principal Problem:   Primary localized osteoarthritis of left knee Active Problems:   Sleep apnea   Morbid obesity ([place])   GERD (gastroesophageal reflux disease)   Asthma   Hypertension   Discharge Diagnoses:  Same  Past Medical History:  Diagnosis Date  . Arthritis   . Asthma   . Family history of adverse reaction to anesthesia    Patients mother went into cardiac arrest twice during surgery 20 years ago  . GERD (gastroesophageal reflux disease)   . Hypertension   . Pneumonia    hx of  . Primary localized osteoarthritis of left knee 08/26/2016  . Primary localized osteoarthritis of right knee   . Sleep apnea    wears CPAP nightly  . Urgency of urination     Surgeries: Procedure(s): TOTAL KNEE ARTHROPLASTY on 09/07/2016   Consultants:   Discharged Condition: Improved  Hospital Course: Kenneth Hester is an 66 y.o. male who was admitted 09/07/2016 for operative treatment ofPrimary localized osteoarthritis of left knee. Patient has severe unremitting pain that affects sleep, daily activities, and work/hobbies. After pre-op clearance the patient was taken to the operating room on 09/07/2016 and underwent  Procedure(s): TOTAL KNEE ARTHROPLASTY.    Patient was given perioperative antibiotics: Anti-infectives    Start     Dose/Rate Route Frequency Ordered Stop   09/07/16 1530  ceFAZolin (ANCEF) IVPB 2g/100 mL premix     2 g 200 mL/hr over 30 Minutes Intravenous Every 6 hours 09/07/16 1506 09/07/16 2244   09/07/16 0859  ceFAZolin (ANCEF) IVPB 2g/100 mL premix  Status:  Discontinued     2 g 200 mL/hr over 30 Minutes Intravenous On call to O.R. 09/07/16 0859 09/07/16 1500       Patient was given sequential compression devices, early ambulation, and chemoprophylaxis to prevent DVT.  Patient benefited maximally from hospital  stay and there were no complications.    Recent vital signs: Patient Vitals for the past 24 hrs:  BP Temp Temp src Pulse Resp SpO2 Weight  09/08/16 0555 (!) 164/72 97.7 F (36.5 C) Oral 70 17 94 % -  09/07/16 2200 128/71 98.5 F (36.9 C) Oral 80 18 93 % -  09/07/16 1926 - - - - - 95 % -  09/07/16 1500 (!) 129/50 98 F (36.7 C) Oral 73 16 94 % -  09/07/16 1437 (!) 127/59 - - 76 17 93 % -  09/07/16 1422 (!) 130/56 - - 79 19 94 % -  09/07/16 1407 (!) 108/57 - - 81 (!) 26 94 % -  09/07/16 1354 - 97.5 F (36.4 C) - - - - -  09/07/16 1352 (!) 123/50 - - 89 19 95 % -  09/07/16 0859 (!) 165/76 97.8 F (36.6 C) Oral 68 18 94 % 116.1 kg (256 lb)     Recent laboratory studies:  Recent Labs  09/08/16 0303  WBC 14.9*  HGB 14.3  HCT 41.4  PLT 162  NA 136  K 4.2  CL 101  CO2 25  BUN 17  CREATININE 1.21  GLUCOSE 167*  CALCIUM 8.9     Discharge Medications:   Allergies as of 09/08/2016      Reactions   Lisinopril Cough   cough, voice was affected and had hard time singing   Achromycin [tetracycline] Rash      Medication List  TAKE these medications   acetaminophen 325 MG tablet Commonly known as:  TYLENOL Take 2 tablets (650 mg total) by mouth every 6 (six) hours as needed for mild pain (or Fever >/= 101).   aspirin 325 MG EC tablet 1 tab a day for the next 30 days to prevent blood clots   docusate sodium 100 MG capsule Commonly known as:  COLACE 1 tab 2 times a day while on narcotics.  STOOL SOFTENER   FISH OIL PO Take 2 capsules by mouth daily.   FLOVENT HFA 110 MCG/ACT inhaler Generic drug:  fluticasone INHALE 2 PUFFS INTO THE LUNGS TWICE A DAY What changed:  See the new instructions.   hydrochlorothiazide 25 MG tablet Commonly known as:  HYDRODIURIL Take 1 tablet (25 mg total) by mouth daily.   ibuprofen 200 MG tablet Commonly known as:  ADVIL,MOTRIN Take 400 mg by mouth daily as needed for headache or moderate pain.   multivitamin with minerals  tablet Take 1 tablet by mouth daily.   oxyCODONE 5 MG immediate release tablet Commonly known as:  Oxy IR/ROXICODONE 1-2 tablets every 4-6 hrs as needed for pain   polyethylene glycol packet Commonly known as:  MIRALAX / GLYCOLAX 17grams in 6 oz of water twice a day until bowel movement.  LAXITIVE.  Restart if two days since last bowel movement   PROAIR HFA 108 (90 Base) MCG/ACT inhaler Generic drug:  albuterol INHALE 2 SPRAYS EVERY 4 HOURS AS NEEDED What changed:  See the new instructions.       Diagnostic Studies: No results found.  Disposition: 01-Home or Self Care  Discharge Instructions    CPM    Complete by:  As directed    Continuous passive motion machine (CPM):      Use the CPM from 0 to 90 for 6 hours per day.       You may break it up into 2 or 3 sessions per day.      Use CPM for 2 weeks or until you are told to stop.   Call MD / Call 911    Complete by:  As directed    If you experience chest pain or shortness of breath, CALL 911 and be transported to the hospital emergency room.  If you develope a fever above 101 F, pus (white drainage) or increased drainage or redness at the wound, or calf pain, call your surgeon's office.   Change dressing    Complete by:  As directed    DO NOT REMOVE BANDAGE OVER SURGICAL INCISION.  [place] WHOLE LEG INCLUDING OVER THE WATERPROOF BANDAGE WITH SOAP AND WATER EVERY DAY.   Constipation Prevention    Complete by:  As directed    Drink plenty of fluids.  Prune juice may be helpful.  You may use a stool softener, such as Colace (over the counter) 100 mg twice a day.  Use MiraLax (over the counter) for constipation as needed.   Diet - low sodium heart healthy    Complete by:  As directed    Discharge instructions    Complete by:  As directed    INSTRUCTIONS AFTER JOINT REPLACEMENT   Remove items at home which could result in a fall. This includes throw rugs or furniture in walking pathways ICE to the affected joint every three  hours while awake for 30 minutes at a time, for at least the first 3-5 days, and then as needed for pain and swelling.  Continue to use ice  for pain and swelling. You may notice swelling that will progress down to the foot and ankle.  This is normal after surgery.  Elevate your leg when you are not up walking on it.   Continue to use the breathing machine you got in the hospital (incentive spirometer) which will help keep your temperature down.  It is common for your temperature to cycle up and down following surgery, especially at night when you are not up moving around and exerting yourself.  The breathing machine keeps your lungs expanded and your temperature down.   DIET:  As you were doing prior to hospitalization, we recommend a well-balanced diet.  DRESSING / WOUND CARE / SHOWERING  Keep the surgical dressing until follow up.  The dressing is water proof, so you can shower without any extra covering.  IF THE DRESSING FALLS OFF or the wound gets wet inside, change the dressing with sterile gauze.  Please use good hand washing techniques before changing the dressing.  Do not use any lotions or creams on the incision until instructed by your surgeon.    ACTIVITY  Increase activity slowly as tolerated, but follow the weight bearing instructions below.   No driving for 6 weeks or until further direction given by your physician.  You cannot drive while taking narcotics.  No lifting or carrying greater than 10 lbs. until further directed by your surgeon. Avoid periods of inactivity such as sitting longer than an hour when not asleep. This helps prevent blood clots.  You may return to work once you are authorized by your doctor.     WEIGHT BEARING   Weight bearing as tolerated with assist device (walker, cane, etc) as directed, use it as long as suggested by your surgeon or therapist, typically at least 2-3 weeks.   EXERCISES  Results after joint replacement surgery are often greatly  improved when you follow the exercise, range of motion and muscle strengthening exercises prescribed by your doctor. Safety measures are also important to protect the joint from further injury. Any time any of these exercises cause you to have increased pain or swelling, decrease what you are doing until you are comfortable again and then slowly increase them. If you have problems or questions, call your caregiver or physical therapist for advice.   Rehabilitation is important following a joint replacement. After just a few days of immobilization, the muscles of the leg can become weakened and shrink (atrophy).  These exercises are designed to build up the tone and strength of the thigh and leg muscles and to improve motion. Often times heat used for twenty to thirty minutes before working out will loosen up your tissues and help with improving the range of motion but do not use heat for the first two weeks following surgery (sometimes heat can increase post-operative swelling).   These exercises can be done on a training (exercise) mat, on the floor, on a table or on a bed. Use whatever works the best and is most comfortable for you.    Use music or television while you are exercising so that the exercises are a pleasant break in your day. This will make your life better with the exercises acting as a break in your routine that you can look forward to.   Perform all exercises about fifteen times, three times per day or as directed.  You should exercise both the operative leg and the other leg as well.   Exercises include:  Lobbyist - Tighten  up the muscle on the front of the thigh (Quad) and hold for 5-10 seconds.   Straight Leg Raises - With your knee straight (if you were given a brace, keep it on), lift the leg to 60 degrees, hold for 3 seconds, and slowly lower the leg.  Perform this exercise against resistance later as your leg gets stronger.  Leg Slides: Lying on your back, slowly slide your foot  toward your buttocks, bending your knee up off the floor (only go as far as is comfortable). Then slowly slide your foot back down until your leg is flat on the floor again.  Angel Wings: Lying on your back spread your legs to the side as far apart as you can without causing discomfort.  Hamstring Strength:  Lying on your back, push your heel against the floor with your leg straight by tightening up the muscles of your buttocks.  Repeat, but this time bend your knee to a comfortable angle, and push your heel against the floor.  You may put a pillow under the heel to make it more comfortable if necessary.   A rehabilitation program following joint replacement surgery can speed recovery and prevent re-injury in the future due to weakened muscles. Contact your doctor or a physical therapist for more information on knee rehabilitation.    CONSTIPATION  Constipation is defined medically as fewer than three stools per week and severe constipation as less than one stool per week.  Even if you have a regular bowel pattern at home, your normal regimen is likely to be disrupted due to multiple reasons following surgery.  Combination of anesthesia, postoperative narcotics, change in appetite and fluid intake all can affect your bowels.   YOU MUST use at least one of the following options; they are listed in order of increasing strength to get the job done.  They are all available over the counter, and you may need to use some, POSSIBLY even all of these options:    Drink plenty of fluids (prune juice may be helpful) and high fiber foods Colace 100 mg by mouth twice a day  Senokot for constipation as directed and as needed Dulcolax (bisacodyl), take with full glass of water  Miralax (polyethylene glycol) once or twice a day as needed.  If you have tried all these things and are unable to have a bowel movement in the first 3-4 days after surgery call either your surgeon or your primary doctor.    If you  experience loose stools or diarrhea, hold the medications until you stool forms back up.  If your symptoms do not get better within 1 week or if they get worse, check with your doctor.  If you experience "the worst abdominal pain ever" or develop nausea or vomiting, please contact the office immediately for further recommendations for treatment.   ITCHING:  If you experience itching with your medications, try taking only a single pain pill, or even half a pain pill at a time.  You can also use Benadryl over the counter for itching or also to help with sleep.   TED HOSE STOCKINGS:  Use stockings on both legs until for at least 2 weeks or as directed by physician office. They may be removed at night for sleeping.  MEDICATIONS:  See your medication summary on the "After Visit Summary" that nursing will review with you.  You may have some home medications which will be placed on hold until you complete the course of blood thinner medication.  It is important for you to complete the blood thinner medication as prescribed.  PRECAUTIONS:  If you experience chest pain or shortness of breath - call 911 immediately for transfer to the hospital emergency department.   If you develop a fever greater that 101 F, purulent drainage from wound, increased redness or drainage from wound, foul odor from the wound/dressing, or calf pain - CONTACT YOUR SURGEON.                                                   FOLLOW-UP APPOINTMENTS:  If you do not already have a post-op appointment, please call the office for an appointment to be seen by your surgeon.  Guidelines for how soon to be seen are listed in your "After Visit Summary", but are typically between 1-4 weeks after surgery.  OTHER INSTRUCTIONS:   Knee Replacement:  Do not place pillow under knee, focus on keeping the knee straight while resting. CPM instructions: 0-90 degrees, 2 hours in the morning, 2 hours in the afternoon, and 2 hours in the evening. Place foam  block, curve side up under heel at all times except when in CPM or when walking.  DO NOT modify, tear, cut, or change the foam block in any way.  MAKE SURE YOU:  Understand these instructions.  Get help right away if you are not doing well or get worse.    Thank you for letting us be a part of your medical care team.  It is a privilege we respect greatly.  We hope these instructions will help you stay on track for a fast and full recovery!   Do not put a pillow under the knee. Place it under the heel.    Complete by:  As directed    Place gray foam block, curve side up under heel at all times except when in CPM or when walking.  DO NOT modify, tear, cut, or change in any way the gray foam block.   Increase activity slowly as tolerated    Complete by:  As directed    Patient may shower    Complete by:  As directed    Aquacel dressing is water proof    Wash over it and the whole leg with soap and water at the end of your shower   TED hose    Complete by:  As directed    Use stockings (TED hose) for 2 weeks on both leg(s).  You may remove them at night for sleeping.      Follow-up Information    Lorn Junes, MD Follow up on 09/21/2016.   Specialty:  Orthopedic Surgery Why:  appt time 3:45 pm Contact information: [address] [place] [address] [phone]            Signed: MACKSEN, BAYS 09/08/2016, 8:17 AM

## 2016-09-08 NOTE — Consult Note (Signed)
[facility] CM Primary Care Navigator  09/08/2016  Kenneth Hester 02/09/1951 [id number]  Met with patient and wife ([name]) at the bedside to identify possible discharge needs. Patient reports having severe unremitting pain that affects sleep and daily activities that led to this admission/surgery.  Patient endorses Dr. Richard Gilbert with [facility] as the primary care provider.    Patient shared using [facility] in [place] to obtain medications without any problem.   Patient manages his own medications at home as stated.  He is able to drive prior to admission/ surgery. Wife will be providing transportation to his doctors' appointments after discharge.  Patient states that wife will be the primary caregiver at home.   Discharge plan is home with home health services and outpatient rehabilitation few weeks after recovery per patient report.  Patient voiced understanding to call primary care provider's office for possible post discharge follow-up appointment within a week or sooner if needs arise. Patient letter given as his reminder.  Patient denies further needs or concerns at this time.  For additional questions please contact:  Lorraine A. Ajel, BSN, RN-BC [facility] PRIMARY CARE Navigator Cell: [phone] 

## 2016-09-08 NOTE — Evaluation (Signed)
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 Patient independent PTA. Patient currently functioning at an overall mod I level using RW.  No additional OT needs identified, D/C from acute OT services and no additional follow-up OT needs at this time. All appropriate education provided to patient and family (spouse). Please re-order OT if needed.      Follow Up Recommendations  No OT follow up;Supervision - Intermittent    Equipment Recommendations  None recommended by OT    Recommendations for Other Services  None at this time  Precautions / Restrictions Precautions Precautions: Knee Precaution Booklet Issued: Yes (comment) Precaution Comments: Educated pt on knee precautions and importance of keeping knee in extended position with no pillow Restrictions Weight Bearing Restrictions: Yes LLE Weight Bearing: Weight bearing as tolerated    Mobility Bed Mobility General bed mobility comments: Pt found in recliner, see PT note  Transfers Overall transfer level: Modified independent Equipment used: Rolling walker (2 wheeled) Transfers: Sit to/from Stand Sit to Stand: Supervision General transfer comment: Cues to allow L knee to bend generously during sit<>stand transitions    Balance Overall balance assessment: No apparent balance deficits (not formally assessed)    ADL Overall ADL's : Modified independent General ADL Comments: Pt overall mod I for bathing, dressing, and functional mobility using RW prn. Pt's wife will be able to  assist prn. Therapist discussed toilet transfers and walk-in shower transfers with pt. Pt verbalized understanding of both.     Vision Vision Assessment?: No apparent visual deficits          Pertinent Vitals/Pain Pain Assessment: 0-10 Pain Score: 2  Pain Location: L knee Pain Descriptors / Indicators: Aching Pain Intervention(s): Monitored during session     Hand Dominance Right   Extremity/Trunk Assessment Upper Extremity Assessment Upper Extremity Assessment: Overall WFL for tasks assessed   Lower Extremity Assessment Lower Extremity Assessment: Defer to PT evaluation LLE Deficits / Details: Showing very nice knee control against gravity; able to perform 10 straight leg raises with minimal quad lag; range approx 2-90 deg   Cervical / Trunk Assessment Cervical / Trunk Assessment: Normal   Communication Communication Communication: No difficulties   Cognition Arousal/Alertness: Awake/alert Behavior During Therapy: WFL for tasks assessed/performed Overall Cognitive Status: Within Functional Limits for tasks assessed              Home Living Family/patient expects to be discharged to:: Private residence Living Arrangements: Spouse/significant other Available Help at Discharge: Family;Available 24 hours/day Type of Home: House Home Access: Stairs to enter [employer] of Steps: 2 Entrance Stairs-Rails: Left Home Layout: One level     Bathroom Shower/Tub: Walk-in shower;Door   [employer] Toilet: Standard Bathroom Accessibility: Yes   Home Equipment: Environmental consultant - 2 wheels;Cane - single point;Bedside commode;Shower seat - built in          Prior Functioning/Environment Level of Independence: Independent          OT Problem List:  n/a, d/c from acute OT caseload   OT Treatment/Interventions:   n/a, d/c from acute OT caseload   OT Goals(Current  goals can be found in the care plan section) Acute Rehab OT Goals Patient Stated Goal: go home today  OT  Frequency:   n/a, d/c from acute OT caseload   Barriers to D/C:  None known at this time    End of Session Equipment Utilized During Treatment: Rolling walker CPM Left Knee CPM Left Knee: Off Nurse Communication: Mobility status  Activity Tolerance: Patient tolerated treatment well Patient left: in chair;with call bell/phone within reach;with family/visitor present;with nursing/sitter in room   Time: 1135-1145 OT Time Calculation (min): 10 min Charges:  OT General Charges $OT Visit: 1 Procedure OT Evaluation $OT Eval Low Complexity: 1 Procedure  Chrys Racer , MS, OTR/L, CLT Pager: [phone] 09/08/2016, 11:53 AM

## 2016-09-09 ENCOUNTER — Encounter: Payer: PPO | Admitting: Family Medicine

## 2016-09-09 DIAGNOSIS — M1712 Unilateral primary osteoarthritis, left knee: Secondary | ICD-10-CM | POA: Diagnosis not present

## 2016-09-09 DIAGNOSIS — Z96652 Presence of left artificial knee joint: Secondary | ICD-10-CM | POA: Diagnosis not present

## 2016-09-11 DIAGNOSIS — Z7951 Long term (current) use of inhaled steroids: Secondary | ICD-10-CM | POA: Diagnosis not present

## 2016-09-11 DIAGNOSIS — Z6838 Body mass index (BMI) 38.0-38.9, adult: Secondary | ICD-10-CM | POA: Diagnosis not present

## 2016-09-11 DIAGNOSIS — J45909 Unspecified asthma, uncomplicated: Secondary | ICD-10-CM | POA: Diagnosis not present

## 2016-09-11 DIAGNOSIS — Z7982 Long term (current) use of aspirin: Secondary | ICD-10-CM | POA: Diagnosis not present

## 2016-09-11 DIAGNOSIS — I10 Essential (primary) hypertension: Secondary | ICD-10-CM | POA: Diagnosis not present

## 2016-09-11 DIAGNOSIS — Z96653 Presence of artificial knee joint, bilateral: Secondary | ICD-10-CM | POA: Diagnosis not present

## 2016-09-11 DIAGNOSIS — Z471 Aftercare following joint replacement surgery: Secondary | ICD-10-CM | POA: Diagnosis not present

## 2016-09-11 DIAGNOSIS — Z87891 Personal history of nicotine dependence: Secondary | ICD-10-CM | POA: Diagnosis not present

## 2016-09-11 DIAGNOSIS — K219 Gastro-esophageal reflux disease without esophagitis: Secondary | ICD-10-CM | POA: Diagnosis not present

## 2016-09-11 DIAGNOSIS — Z79891 Long term (current) use of opiate analgesic: Secondary | ICD-10-CM | POA: Diagnosis not present

## 2016-09-11 DIAGNOSIS — G4730 Sleep apnea, unspecified: Secondary | ICD-10-CM | POA: Diagnosis not present

## 2016-09-14 DIAGNOSIS — J45909 Unspecified asthma, uncomplicated: Secondary | ICD-10-CM | POA: Diagnosis not present

## 2016-09-14 DIAGNOSIS — Z96653 Presence of artificial knee joint, bilateral: Secondary | ICD-10-CM | POA: Diagnosis not present

## 2016-09-14 DIAGNOSIS — Z7982 Long term (current) use of aspirin: Secondary | ICD-10-CM | POA: Diagnosis not present

## 2016-09-14 DIAGNOSIS — Z471 Aftercare following joint replacement surgery: Secondary | ICD-10-CM | POA: Diagnosis not present

## 2016-09-14 DIAGNOSIS — K219 Gastro-esophageal reflux disease without esophagitis: Secondary | ICD-10-CM | POA: Diagnosis not present

## 2016-09-14 DIAGNOSIS — Z7951 Long term (current) use of inhaled steroids: Secondary | ICD-10-CM | POA: Diagnosis not present

## 2016-09-14 DIAGNOSIS — I10 Essential (primary) hypertension: Secondary | ICD-10-CM | POA: Diagnosis not present

## 2016-09-14 DIAGNOSIS — Z6838 Body mass index (BMI) 38.0-38.9, adult: Secondary | ICD-10-CM | POA: Diagnosis not present

## 2016-09-14 DIAGNOSIS — Z87891 Personal history of nicotine dependence: Secondary | ICD-10-CM | POA: Diagnosis not present

## 2016-09-14 DIAGNOSIS — G4730 Sleep apnea, unspecified: Secondary | ICD-10-CM | POA: Diagnosis not present

## 2016-09-14 DIAGNOSIS — Z79891 Long term (current) use of opiate analgesic: Secondary | ICD-10-CM | POA: Diagnosis not present

## 2016-09-15 DIAGNOSIS — M1712 Unilateral primary osteoarthritis, left knee: Secondary | ICD-10-CM | POA: Diagnosis not present

## 2016-09-16 ENCOUNTER — Other Ambulatory Visit: Payer: Self-pay | Admitting: Family Medicine

## 2016-09-17 ENCOUNTER — Other Ambulatory Visit: Payer: Self-pay | Admitting: Orthopedic Surgery

## 2016-09-17 DIAGNOSIS — Z96652 Presence of left artificial knee joint: Secondary | ICD-10-CM | POA: Diagnosis not present

## 2016-09-17 DIAGNOSIS — M25562 Pain in left knee: Principal | ICD-10-CM

## 2016-09-17 DIAGNOSIS — M25462 Effusion, left knee: Secondary | ICD-10-CM

## 2016-09-18 ENCOUNTER — Ambulatory Visit: Payer: PPO

## 2016-09-18 ENCOUNTER — Ambulatory Visit
Admission: RE | Admit: 2016-09-18 | Discharge: 2016-09-18 | Disposition: A | Discharge status: Home / Self Care | Payer: PPO | Source: Ambulatory Visit | Attending: Orthopedic Surgery | Admitting: Orthopedic Surgery

## 2016-09-18 DIAGNOSIS — M79605 Pain in left leg: Secondary | ICD-10-CM | POA: Diagnosis not present

## 2016-09-18 DIAGNOSIS — M7989 Other specified soft tissue disorders: Secondary | ICD-10-CM | POA: Insufficient documentation

## 2016-09-18 DIAGNOSIS — M25462 Effusion, left knee: Secondary | ICD-10-CM

## 2016-09-18 DIAGNOSIS — M25562 Pain in left knee: Secondary | ICD-10-CM

## 2016-09-18 DIAGNOSIS — M79662 Pain in left lower leg: Secondary | ICD-10-CM | POA: Insufficient documentation

## 2016-09-21 DIAGNOSIS — Z96652 Presence of left artificial knee joint: Secondary | ICD-10-CM | POA: Diagnosis not present

## 2016-09-23 DIAGNOSIS — M25662 Stiffness of left knee, not elsewhere classified: Secondary | ICD-10-CM | POA: Diagnosis not present

## 2016-09-23 DIAGNOSIS — M25552 Pain in left hip: Secondary | ICD-10-CM | POA: Diagnosis not present

## 2016-09-28 DIAGNOSIS — M25662 Stiffness of left knee, not elsewhere classified: Secondary | ICD-10-CM | POA: Diagnosis not present

## 2016-09-28 DIAGNOSIS — M25562 Pain in left knee: Secondary | ICD-10-CM | POA: Diagnosis not present

## 2016-09-28 DIAGNOSIS — M6281 Muscle weakness (generalized): Secondary | ICD-10-CM | POA: Diagnosis not present

## 2016-09-28 DIAGNOSIS — R262 Difficulty in walking, not elsewhere classified: Secondary | ICD-10-CM | POA: Diagnosis not present

## 2016-09-29 DIAGNOSIS — M25662 Stiffness of left knee, not elsewhere classified: Secondary | ICD-10-CM | POA: Diagnosis not present

## 2016-09-29 DIAGNOSIS — M6281 Muscle weakness (generalized): Secondary | ICD-10-CM | POA: Diagnosis not present

## 2016-09-29 DIAGNOSIS — M25562 Pain in left knee: Secondary | ICD-10-CM | POA: Diagnosis not present

## 2016-09-29 DIAGNOSIS — R262 Difficulty in walking, not elsewhere classified: Secondary | ICD-10-CM | POA: Diagnosis not present

## 2016-10-01 DIAGNOSIS — M6281 Muscle weakness (generalized): Secondary | ICD-10-CM | POA: Diagnosis not present

## 2016-10-01 DIAGNOSIS — M25662 Stiffness of left knee, not elsewhere classified: Secondary | ICD-10-CM | POA: Diagnosis not present

## 2016-10-01 DIAGNOSIS — R262 Difficulty in walking, not elsewhere classified: Secondary | ICD-10-CM | POA: Diagnosis not present

## 2016-10-01 DIAGNOSIS — M25562 Pain in left knee: Secondary | ICD-10-CM | POA: Diagnosis not present

## 2016-10-02 ENCOUNTER — Emergency Department: Payer: PPO

## 2016-10-02 ENCOUNTER — Encounter: Payer: Self-pay | Admitting: Emergency Medicine

## 2016-10-02 ENCOUNTER — Emergency Department
Admission: EM | Admit: 2016-10-02 | Discharge: 2016-10-02 | Disposition: A | Discharge status: Home / Self Care | Payer: PPO | Attending: Emergency Medicine | Admitting: Emergency Medicine

## 2016-10-02 DIAGNOSIS — Z79899 Other long term (current) drug therapy: Secondary | ICD-10-CM | POA: Diagnosis not present

## 2016-10-02 DIAGNOSIS — J4521 Mild intermittent asthma with (acute) exacerbation: Secondary | ICD-10-CM | POA: Insufficient documentation

## 2016-10-02 DIAGNOSIS — Z7982 Long term (current) use of aspirin: Secondary | ICD-10-CM | POA: Diagnosis not present

## 2016-10-02 DIAGNOSIS — Z87891 Personal history of nicotine dependence: Secondary | ICD-10-CM | POA: Diagnosis not present

## 2016-10-02 DIAGNOSIS — J069 Acute upper respiratory infection, unspecified: Secondary | ICD-10-CM

## 2016-10-02 DIAGNOSIS — R0981 Nasal congestion: Secondary | ICD-10-CM | POA: Diagnosis not present

## 2016-10-02 DIAGNOSIS — I10 Essential (primary) hypertension: Secondary | ICD-10-CM | POA: Insufficient documentation

## 2016-10-02 DIAGNOSIS — J111 Influenza due to unidentified influenza virus with other respiratory manifestations: Secondary | ICD-10-CM | POA: Diagnosis not present

## 2016-10-02 DIAGNOSIS — R05 Cough: Secondary | ICD-10-CM | POA: Diagnosis not present

## 2016-10-02 MED ORDER — IPRATROPIUM-ALBUTEROL 0.5-2.5 (3) MG/3ML IN SOLN
3.0000 mL | Freq: Once | RESPIRATORY_TRACT | Status: AC
  Administered 2016-10-02: 3 mL via RESPIRATORY_TRACT
  Filled 2016-10-02: qty 3

## 2016-10-02 MED ORDER — PREDNISONE 20 MG PO TABS
60.0000 mg | ORAL_TABLET | Freq: Once | ORAL | Status: AC
  Administered 2016-10-02: 60 mg via ORAL
  Filled 2016-10-02: qty 3

## 2016-10-02 MED ORDER — ALBUTEROL SULFATE (2.5 MG/3ML) 0.083% IN NEBU
5.0000 mg | INHALATION_SOLUTION | Freq: Once | RESPIRATORY_TRACT | Status: AC
  Administered 2016-10-02: 5 mg via RESPIRATORY_TRACT
  Filled 2016-10-02: qty 6

## 2016-10-02 MED ORDER — PREDNISONE 20 MG PO TABS: ORAL_TABLET | ORAL | 0 refills | Status: DC

## 2016-10-02 MED ORDER — ALBUTEROL SULFATE (2.5 MG/3ML) 0.083% IN NEBU: 2.5000 mg | INHALATION_SOLUTION | RESPIRATORY_TRACT | 0 refills | Status: AC | PRN

## 2016-10-02 NOTE — Discharge Instructions (Signed)
1. Take steroid as prescribed (prednisone 60 mg daily 4 days). 2. You may use albuterol nebulizer every 4 hours as needed for wheezing/difficulty breathing. 3. Return to the ER for worsening symptoms, persistent vomiting, difficulty breathing, chest pain or other concerns.

## 2016-10-02 NOTE — ED Notes (Signed)
Patient stable at time of discharge. 

## 2016-10-02 NOTE — ED Triage Notes (Signed)
Pt to triage in wheelchair, due to SOB. Pt reports he has had flu-like symptoms x1 week with increasing SOB tonight. Pt O2 in triage is 100% and pt has HX of asthma, no relief with at home treatment of inhalers.

## 2016-10-02 NOTE — ED Provider Notes (Signed)
[facility] Emergency Department Provider Note   ____________________________________________   First MD Initiated Contact with Patient 10/02/16 [phone]     (approximate)  I have reviewed the triage vital signs and the nursing notes.   HISTORY  Chief Complaint Shortness of Breath    HPI Kenneth Hester is a 66 y.o. male presents to the ED from home with a chief complaint of cough, congestion, wheezing, shortness of breath. Patient has a history of asthma. Reports recent cold like symptoms, taking Mucinex with some relief. Patient reports he had cold and congestion prior to left total knee replacement on 09/07/2016. Subsequently his spouse had influenza-like symptoms and he reports return of cough, congestion. Took his albuterol inhaler without relief of wheezing.Reports mild chest tightness as stated with wheezing. Denies fever, chills, abdominal pain, nausea, vomiting, diarrhea, calf pain. Denies recent travel or trauma. Nothing makes his symptoms better or worse.   Past Medical History:  Diagnosis Date  . Arthritis   . Asthma   . Family history of adverse reaction to anesthesia    Patients mother went into cardiac arrest twice during surgery 20 years ago  . GERD (gastroesophageal reflux disease)   . Hypertension   . Pneumonia    hx of  . Primary localized osteoarthritis of left knee 08/26/2016  . Primary localized osteoarthritis of right knee   . Sleep apnea    wears CPAP nightly  . Urgency of urination     Patient Active Problem List   Diagnosis Date Noted  . Primary localized osteoarthritis of left knee 08/26/2016  . Umbilical hernia without obstruction and without gangrene 05/26/2016  . Encounter for screening colonoscopy 12/03/2015  . Primary localized osteoarthritis of right knee   . Hypertension   . Chest pain 12/05/2013  . Sleep apnea 12/05/2013  . Morbid obesity ([place]) 12/05/2013  . GERD (gastroesophageal reflux disease) 12/05/2013  .  Asthma 12/05/2013    Past Surgical History:  Procedure Laterality Date  . APPENDECTOMY    . COLONOSCOPY  2007  . COLONOSCOPY W/ POLYPECTOMY    . COLONOSCOPY WITH PROPOFOL N/A 07/01/2016   Procedure: COLONOSCOPY WITH PROPOFOL;  Surgeon: Robert Bellow, MD;  Location: [facility] ENDOSCOPY;  Service: Endoscopy;  Laterality: N/A;  . HERNIA REPAIR     66 yrs old and 66 yrs old  . hydrocelectomy    . KNEE SURGERY Right   . SKIN GRAFT    . SKIN SPLIT GRAFT    . TOTAL KNEE ARTHROPLASTY Right 10/07/2015   Procedure: TOTAL KNEE ARTHROPLASTY;  Surgeon: Elsie Saas, MD;  Location: [place];  Service: Orthopedics;  Laterality: Right;  . TOTAL KNEE ARTHROPLASTY Left 09/07/2016   Procedure: TOTAL KNEE ARTHROPLASTY;  Surgeon: Elsie Saas, MD;  Location: [place];  Service: Orthopedics;  Laterality: Left;    Prior to Admission medications   Medication Sig Start Date End Date Taking? Authorizing Provider  aspirin EC 325 MG EC tablet 1 tab a day for the next 30 days to prevent blood clots 09/08/16  Yes Kirstin Shepperson, PA-C  FLOVENT HFA 110 MCG/ACT inhaler INHALE 2 PUFFS INTO THE LUNGS TWICE A DAY Patient taking differently: INHALE 2 PUFFS INTO THE LUNGS TWICE A DAY AS NEEDED FOR SHORTNESS OF BREATH 04/22/16  Yes Jerrol Banana., MD  hydrochlorothiazide (HYDRODIURIL) 25 MG tablet TAKE 1 TABLET (25 MG TOTAL) BY MOUTH DAILY. 09/16/16  Yes Richard Maceo Pro., MD  Multiple Vitamins-Minerals (MULTIVITAMIN WITH MINERALS) tablet Take 1 tablet by  mouth daily.    Yes Historical Provider, MD  naproxen sodium (ANAPROX) 220 MG tablet Take 220 mg by mouth 2 (two) times daily as needed.   Yes Historical Provider, MD  Omega-3 Fatty Acids (FISH OIL PO) Take 2 capsules by mouth daily.   Yes Historical Provider, MD  oxyCODONE (OXY IR/ROXICODONE) 5 MG immediate release tablet 1-2 tablets every 4-6 hrs as needed for pain 09/08/16  Yes Kirstin Shepperson, PA-C  acetaminophen (TYLENOL) 325 MG tablet Take 2 tablets (650 mg  total) by mouth every 6 (six) hours as needed for mild pain (or Fever >/= 101). 09/08/16   Kirstin Shepperson, PA-C  albuterol (PROVENTIL) (2.5 MG/3ML) 0.083% nebulizer solution Take 3 mLs (2.5 mg total) by nebulization every 4 (four) hours as needed for wheezing or shortness of breath. 10/02/16   Paulette Blanch, MD  docusate sodium (COLACE) 100 MG capsule 1 tab 2 times a day while on narcotics.  STOOL SOFTENER Patient not taking: Reported on 10/02/2016 09/08/16   Kirstin Shepperson, PA-C  polyethylene glycol (MIRALAX / GLYCOLAX) packet 17grams in 6 oz of water twice a day until bowel movement.  LAXITIVE.  Restart if two days since last bowel movement Patient not taking: Reported on 10/02/2016 09/08/16   Kirstin Shepperson, PA-C  predniSONE (DELTASONE) 20 MG tablet 3 tablets daily x 4 days 10/02/16   Paulette Blanch, MD    Allergies Lisinopril and Achromycin [tetracycline]  Family History  Problem Relation Age of Onset  . Heart attack Mother   . Heart failure Mother   . Diabetes Mother   . Hypertension Mother   . Heart disease Mother   . Heart attack Sister   . Heart disease Sister   . Diabetes Sister   . Heart disease Father   . Cancer Father   . Diabetes Son   . Heart attack Maternal Grandfather   . Heart disease Son     Social History Social History  Substance Use Topics  . Smoking status: Former Smoker    Types: Cigarettes  . Smokeless tobacco: Never Used     Comment: > 40 years ago  . Alcohol use No    Review of Systems  Constitutional: No fever/chills. Eyes: No visual changes. ENT: No sore throat. Cardiovascular: Denies chest pain. Respiratory: Positive for wheezing and shortness of breath. Gastrointestinal: No abdominal pain.  No nausea, no vomiting.  No diarrhea.  No constipation. Genitourinary: Negative for dysuria. Musculoskeletal: Negative for back pain. Skin: Negative for rash. Neurological: Negative for headaches, focal weakness or numbness.  10-point ROS otherwise  negative.  ____________________________________________   PHYSICAL EXAM:  VITAL SIGNS: ED Triage Vitals  Enc Vitals Group     BP 10/02/16 0255 126/65     Pulse Rate 10/02/16 0255 77     Resp 10/02/16 0255 17     Temp 10/02/16 0255 98.9 F (37.2 C)     Temp Source 10/02/16 0255 Oral     SpO2 10/02/16 0255 100 %     Weight 10/02/16 0256 245 lb (111.1 kg)     Height 10/02/16 0256 5\' 8"  (1.727 m)     Head Circumference --      Peak Flow --      Pain Score --      Pain Loc --      Pain Edu? --      Excl. in [place]? --     Constitutional: Alert and oriented. Well appearing and in no acute distress. Eyes: Conjunctivae are  normal. PERRL. EOMI. Head: Atraumatic. Nose: No congestion/rhinnorhea. Mouth/Throat: Mucous membranes are moist.  Oropharynx non-erythematous. Neck: No stridor.   Cardiovascular: Normal rate, regular rhythm. Grossly normal heart sounds.  Good peripheral circulation. Respiratory: Normal respiratory effort.  No retractions. Lungs with bilateral wheezing, right greater than left. Gastrointestinal: Soft and nontender. No distention. No abdominal bruits. No CVA tenderness. Musculoskeletal: No lower extremity tenderness nor edema.  No joint effusions. Neurologic:  Normal speech and language. No gross focal neurologic deficits are appreciated. No gait instability. Skin:  Skin is warm, dry and intact. No rash noted. Psychiatric: Mood and affect are normal. Speech and behavior are normal.  ____________________________________________   LABS (all labs ordered are listed, but only abnormal results are displayed)  Labs Reviewed - No data to display ____________________________________________  EKG  None ____________________________________________  RADIOLOGY  Chest 2 view interpreted per Dr. Francoise Ceo: No active cardiopulmonary disease. ____________________________________________   PROCEDURES  Procedure(s) performed: None  Procedures  Critical Care  performed: No  ____________________________________________   INITIAL IMPRESSION / ASSESSMENT AND PLAN / ED COURSE  Pertinent labs & imaging results that were available during my care of the patient were reviewed by me and considered in my medical decision making (see chart for details).  66 year old male who presents with viral URI with cough and wheezing; exacerbation of asthma. Patient received a albuterol nebulizer treatment at 3 AM and reports improvement in symptoms. Room air saturations are 100%. Mild wheezing remains. Will administered DuoNeb, initiate prednisone and reassess. Did discuss with patient and spouse a workup for PE given his recent knee surgery. However, patient is not complaining of chest pain, he is not tachypneic nor hypoxic, and has wheezing on lung auscultation. Patient is aware of PE within the differential diagnosis but prefers to hold off for now as he feels he is experiencing an exacerbation of his asthma.  Clinical Course as of Oct 03 651  Fri Oct 02, 2016  0652 Patient much improved. No wheezing on reexamination. Blood pressure 149/66. Room air saturations 97%. Strict return precautions given. Patient's spouse verbalized understanding and agree with plan of care.  [JS]    Clinical Course User Index [JS] Paulette Blanch, MD     ____________________________________________   FINAL CLINICAL IMPRESSION(S) / ED DIAGNOSES  Final diagnoses:  Mild intermittent asthma with exacerbation  URI with cough and congestion      NEW MEDICATIONS STARTED DURING THIS VISIT:  New Prescriptions   ALBUTEROL (PROVENTIL) (2.5 MG/3ML) 0.083% NEBULIZER SOLUTION    Take 3 mLs (2.5 mg total) by nebulization every 4 (four) hours as needed for wheezing or shortness of breath.   PREDNISONE (DELTASONE) 20 MG TABLET    3 tablets daily x 4 days     Note:  This document was prepared using Dragon voice recognition software and may include unintentional dictation errors.    Paulette Blanch, MD 10/02/16 [phone]

## 2016-10-05 DIAGNOSIS — M25562 Pain in left knee: Secondary | ICD-10-CM | POA: Diagnosis not present

## 2016-10-05 DIAGNOSIS — M6281 Muscle weakness (generalized): Secondary | ICD-10-CM | POA: Diagnosis not present

## 2016-10-05 DIAGNOSIS — R262 Difficulty in walking, not elsewhere classified: Secondary | ICD-10-CM | POA: Diagnosis not present

## 2016-10-05 DIAGNOSIS — M25662 Stiffness of left knee, not elsewhere classified: Secondary | ICD-10-CM | POA: Diagnosis not present

## 2016-10-06 ENCOUNTER — Ambulatory Visit (INDEPENDENT_AMBULATORY_CARE_PROVIDER_SITE_OTHER): Payer: PPO | Admitting: Family Medicine

## 2016-10-06 ENCOUNTER — Encounter: Payer: Self-pay | Admitting: Family Medicine

## 2016-10-06 VITALS — BP 144/76 | HR 76 | Temp 97.7°F | Resp 20 | Wt 251.0 lb

## 2016-10-06 DIAGNOSIS — J4521 Mild intermittent asthma with (acute) exacerbation: Secondary | ICD-10-CM

## 2016-10-06 DIAGNOSIS — G4701 Insomnia due to medical condition: Secondary | ICD-10-CM

## 2016-10-06 DIAGNOSIS — J45901 Unspecified asthma with (acute) exacerbation: Secondary | ICD-10-CM

## 2016-10-06 MED ORDER — ZOLPIDEM TARTRATE 10 MG PO TABS: 10.0000 mg | ORAL_TABLET | Freq: Every evening | ORAL | 3 refills | Status: DC | PRN

## 2016-10-06 MED ORDER — PREDNISONE 10 MG (21) PO TBPK: 10.0000 mg | ORAL_TABLET | Freq: Every day | ORAL | 0 refills | Status: DC

## 2016-10-06 NOTE — Progress Notes (Signed)
Patient: Kenneth Hester Male    DOB: [birth year]   66 y.o.   MRN: [id number] Visit Date: 10/06/2016  Today's Provider: Wilhemena Durie, MD   Chief Complaint  Patient presents with  . ER Follow Up   Subjective:    HPI     Follow up ER visit  Patient was seen in ER for cough, congestion, wheezing and SOB on 10/02/2016. He was treated for asthma exacerbation. ER doctor was concerned about PE dx differential given recent knee surgery, but states that his sx her similar to asthma exacerbations in the past. Treatment for this included Albuterol and DuoNeb nebulizer administered in ED, along with 60 mg of Prednisone. Pt was given albuterol neb rx and 60 mg of Prednisone for 4 days. He reports excellent compliance with treatment. He reports this condition is somewhat Improved. Pt still using nebulizer up to 4 times a day, and is still wheezing. Pt also notices appetite change. Has completed the prednisone course today. Pt is taking Mucinex.   ------------------------------------------------------------------------------------  Knee Pain Pt had left TKR on 09/07/2016. Pt is concerned about the oxycodone 5 mg he was prescribed. He states, "I can't sleep without it". Pt is concerned about becoming addicted to the medication. Pt also asking how much Aleve he can take in a day.  Allergies  Allergen Reactions  . Lisinopril Cough    cough, voice was affected and had hard time singing  . Achromycin [Tetracycline] Rash     Current Outpatient Prescriptions:  .  albuterol (PROVENTIL) (2.5 MG/3ML) 0.083% nebulizer solution, Take 3 mLs (2.5 mg total) by nebulization every 4 (four) hours as needed for wheezing or shortness of breath., Disp: 75 mL, Rfl: 0 .  aspirin EC 325 MG EC tablet, 1 tab a day for the next 30 days to prevent blood clots, Disp: 30 tablet, Rfl: 0 .  FLOVENT HFA 110 MCG/ACT inhaler, INHALE 2 PUFFS INTO THE LUNGS TWICE A DAY (Patient taking differently: INHALE 2 PUFFS  INTO THE LUNGS TWICE A DAY AS NEEDED FOR SHORTNESS OF BREATH), Disp: 12 Inhaler, Rfl: 12 .  hydrochlorothiazide (HYDRODIURIL) 25 MG tablet, TAKE 1 TABLET (25 MG TOTAL) BY MOUTH DAILY., Disp: 90 tablet, Rfl: 3 .  Multiple Vitamins-Minerals (MULTIVITAMIN WITH MINERALS) tablet, Take 1 tablet by mouth daily. , Disp: , Rfl:  .  naproxen sodium (ANAPROX) 220 MG tablet, Take 220 mg by mouth 2 (two) times daily as needed., Disp: , Rfl:  .  oxyCODONE (OXY IR/ROXICODONE) 5 MG immediate release tablet, 1-2 tablets every 4-6 hrs as needed for pain, Disp: 60 tablet, Rfl: 0 .  acetaminophen (TYLENOL) 325 MG tablet, Take 2 tablets (650 mg total) by mouth every 6 (six) hours as needed for mild pain (or Fever >/= 101). (Patient not taking: Reported on 10/06/2016), Disp: , Rfl:  .  docusate sodium (COLACE) 100 MG capsule, 1 tab 2 times a day while on narcotics.  STOOL SOFTENER (Patient not taking: Reported on 10/02/2016), Disp: 60 capsule, Rfl: 0 .  Omega-3 Fatty Acids (FISH OIL PO), Take 2 capsules by mouth daily., Disp: , Rfl:  .  polyethylene glycol (MIRALAX / GLYCOLAX) packet, 17grams in 6 oz of water twice a day until bowel movement.  LAXITIVE.  Restart if two days since last bowel movement (Patient not taking: Reported on 10/02/2016), Disp: 14 each, Rfl: 0  Review of Systems  Constitutional: Positive for activity change, appetite change, chills and diaphoresis. Negative for fatigue and fever.  Respiratory: Positive for cough, chest tightness, shortness of breath and wheezing.   Cardiovascular: Negative for chest pain, palpitations and leg swelling.  Musculoskeletal: Positive for arthralgias and joint swelling.    Social History  Substance Use Topics  . Smoking status: Former Smoker    Types: Cigarettes  . Smokeless tobacco: Never Used     Comment: > 40 years ago  . Alcohol use No   Objective:   BP (!) 144/76 (BP Location: Right Arm, Patient Position: Sitting, Cuff Size: Large)   Pulse 76   Temp 97.7 F  (36.5 C) (Oral)   Resp 20   Wt 251 lb (113.9 kg)   SpO2 96%   BMI 38.16 kg/m   Physical Exam  Constitutional: He is oriented to person, place, and time. He appears well-developed and well-nourished.  HENT:  Head: Normocephalic and atraumatic.  Eyes: No scleral icterus.  Neck: No thyromegaly present.  Cardiovascular: Normal rate, regular rhythm and normal heart sounds.   Pulmonary/Chest: Effort normal. No respiratory distress. He has wheezes.  Mild inspiratory wheezes.  Abdominal: Soft.  Neurological: He is alert and oriented to person, place, and time.  Skin: Skin is warm and dry.  Psychiatric: He has a normal mood and affect. His behavior is normal. Judgment and thought content normal.        Assessment & Plan:     1. Exacerbation of asthma, unspecified asthma severity, unspecified whether persistent Not to goal. Will add 6 days of prednisone taper.  - predniSONE (STERAPRED UNI-PAK 21 TAB) 10 MG (21) TBPK tablet; Take 1 tablet (10 mg total) by mouth daily. Taper for 6 days as directed.  Dispense: 21 tablet; Refill: 0  2. Mild intermittent asthma with acute exacerbation Pt reports he had sx for several months. Will refer to pulmonology as below. - Ambulatory referral to Pulmonology  3. Insomnia due to medical condition Due to discomfort s/p TKR. Start Ambien as below. - zolpidem (AMBIEN) 10 MG tablet; Take 1 tablet (10 mg total) by mouth at bedtime as needed for sleep.  Dispense: 30 tablet; Refill: 3    4.Obesity 5.OA Patient seen and examined by Miguel Aschoff, MD, and note scribed by Renaldo Fiddler, CMA. I have done the exam and reviewed the above chart and it is accurate to the best of my knowledge. Development worker, community has been used in this note in any air is in the dictation or transcription are unintentional.  Wilhemena Durie, MD  [place]

## 2016-10-07 DIAGNOSIS — M6281 Muscle weakness (generalized): Secondary | ICD-10-CM | POA: Diagnosis not present

## 2016-10-07 DIAGNOSIS — M25562 Pain in left knee: Secondary | ICD-10-CM | POA: Diagnosis not present

## 2016-10-07 DIAGNOSIS — M25662 Stiffness of left knee, not elsewhere classified: Secondary | ICD-10-CM | POA: Diagnosis not present

## 2016-10-07 DIAGNOSIS — R262 Difficulty in walking, not elsewhere classified: Secondary | ICD-10-CM | POA: Diagnosis not present

## 2016-10-08 DIAGNOSIS — M25662 Stiffness of left knee, not elsewhere classified: Secondary | ICD-10-CM | POA: Diagnosis not present

## 2016-10-08 DIAGNOSIS — M6281 Muscle weakness (generalized): Secondary | ICD-10-CM | POA: Diagnosis not present

## 2016-10-08 DIAGNOSIS — R262 Difficulty in walking, not elsewhere classified: Secondary | ICD-10-CM | POA: Diagnosis not present

## 2016-10-08 DIAGNOSIS — M25562 Pain in left knee: Secondary | ICD-10-CM | POA: Diagnosis not present

## 2016-10-12 DIAGNOSIS — M6281 Muscle weakness (generalized): Secondary | ICD-10-CM | POA: Diagnosis not present

## 2016-10-12 DIAGNOSIS — R262 Difficulty in walking, not elsewhere classified: Secondary | ICD-10-CM | POA: Diagnosis not present

## 2016-10-12 DIAGNOSIS — M25562 Pain in left knee: Secondary | ICD-10-CM | POA: Diagnosis not present

## 2016-10-12 DIAGNOSIS — M25662 Stiffness of left knee, not elsewhere classified: Secondary | ICD-10-CM | POA: Diagnosis not present

## 2016-10-13 DIAGNOSIS — R262 Difficulty in walking, not elsewhere classified: Secondary | ICD-10-CM | POA: Diagnosis not present

## 2016-10-13 DIAGNOSIS — M25662 Stiffness of left knee, not elsewhere classified: Secondary | ICD-10-CM | POA: Diagnosis not present

## 2016-10-13 DIAGNOSIS — M25562 Pain in left knee: Secondary | ICD-10-CM | POA: Diagnosis not present

## 2016-10-13 DIAGNOSIS — M6281 Muscle weakness (generalized): Secondary | ICD-10-CM | POA: Diagnosis not present

## 2016-10-15 DIAGNOSIS — M6281 Muscle weakness (generalized): Secondary | ICD-10-CM | POA: Diagnosis not present

## 2016-10-15 DIAGNOSIS — M25662 Stiffness of left knee, not elsewhere classified: Secondary | ICD-10-CM | POA: Diagnosis not present

## 2016-10-15 DIAGNOSIS — M25562 Pain in left knee: Secondary | ICD-10-CM | POA: Diagnosis not present

## 2016-10-15 DIAGNOSIS — R262 Difficulty in walking, not elsewhere classified: Secondary | ICD-10-CM | POA: Diagnosis not present

## 2016-10-20 DIAGNOSIS — M25562 Pain in left knee: Secondary | ICD-10-CM | POA: Diagnosis not present

## 2016-10-21 DIAGNOSIS — M6281 Muscle weakness (generalized): Secondary | ICD-10-CM | POA: Diagnosis not present

## 2016-10-21 DIAGNOSIS — M25662 Stiffness of left knee, not elsewhere classified: Secondary | ICD-10-CM | POA: Diagnosis not present

## 2016-10-21 DIAGNOSIS — M25562 Pain in left knee: Secondary | ICD-10-CM | POA: Diagnosis not present

## 2016-10-21 DIAGNOSIS — R262 Difficulty in walking, not elsewhere classified: Secondary | ICD-10-CM | POA: Diagnosis not present

## 2016-10-22 DIAGNOSIS — M25662 Stiffness of left knee, not elsewhere classified: Secondary | ICD-10-CM | POA: Diagnosis not present

## 2016-10-22 DIAGNOSIS — M25562 Pain in left knee: Secondary | ICD-10-CM | POA: Diagnosis not present

## 2016-10-22 DIAGNOSIS — R262 Difficulty in walking, not elsewhere classified: Secondary | ICD-10-CM | POA: Diagnosis not present

## 2016-10-22 DIAGNOSIS — M6281 Muscle weakness (generalized): Secondary | ICD-10-CM | POA: Diagnosis not present

## 2016-10-26 DIAGNOSIS — R262 Difficulty in walking, not elsewhere classified: Secondary | ICD-10-CM | POA: Diagnosis not present

## 2016-10-26 DIAGNOSIS — M25662 Stiffness of left knee, not elsewhere classified: Secondary | ICD-10-CM | POA: Diagnosis not present

## 2016-10-26 DIAGNOSIS — M25562 Pain in left knee: Secondary | ICD-10-CM | POA: Diagnosis not present

## 2016-10-26 DIAGNOSIS — M6281 Muscle weakness (generalized): Secondary | ICD-10-CM | POA: Diagnosis not present

## 2016-10-29 DIAGNOSIS — M25562 Pain in left knee: Secondary | ICD-10-CM | POA: Diagnosis not present

## 2016-10-29 DIAGNOSIS — M25662 Stiffness of left knee, not elsewhere classified: Secondary | ICD-10-CM | POA: Diagnosis not present

## 2016-10-29 DIAGNOSIS — M6281 Muscle weakness (generalized): Secondary | ICD-10-CM | POA: Diagnosis not present

## 2016-10-29 DIAGNOSIS — R262 Difficulty in walking, not elsewhere classified: Secondary | ICD-10-CM | POA: Diagnosis not present

## 2016-11-02 DIAGNOSIS — R262 Difficulty in walking, not elsewhere classified: Secondary | ICD-10-CM | POA: Diagnosis not present

## 2016-11-02 DIAGNOSIS — M6281 Muscle weakness (generalized): Secondary | ICD-10-CM | POA: Diagnosis not present

## 2016-11-02 DIAGNOSIS — M25662 Stiffness of left knee, not elsewhere classified: Secondary | ICD-10-CM | POA: Diagnosis not present

## 2016-11-02 DIAGNOSIS — M25562 Pain in left knee: Secondary | ICD-10-CM | POA: Diagnosis not present

## 2016-11-05 DIAGNOSIS — M25662 Stiffness of left knee, not elsewhere classified: Secondary | ICD-10-CM | POA: Diagnosis not present

## 2016-11-05 DIAGNOSIS — M6281 Muscle weakness (generalized): Secondary | ICD-10-CM | POA: Diagnosis not present

## 2016-11-05 DIAGNOSIS — M25562 Pain in left knee: Secondary | ICD-10-CM | POA: Diagnosis not present

## 2016-11-05 DIAGNOSIS — R262 Difficulty in walking, not elsewhere classified: Secondary | ICD-10-CM | POA: Diagnosis not present

## 2016-11-10 DIAGNOSIS — M25562 Pain in left knee: Secondary | ICD-10-CM | POA: Diagnosis not present

## 2016-11-10 DIAGNOSIS — M6281 Muscle weakness (generalized): Secondary | ICD-10-CM | POA: Diagnosis not present

## 2016-11-10 DIAGNOSIS — M25662 Stiffness of left knee, not elsewhere classified: Secondary | ICD-10-CM | POA: Diagnosis not present

## 2016-11-10 DIAGNOSIS — R262 Difficulty in walking, not elsewhere classified: Secondary | ICD-10-CM | POA: Diagnosis not present

## 2016-11-12 DIAGNOSIS — M25662 Stiffness of left knee, not elsewhere classified: Secondary | ICD-10-CM | POA: Diagnosis not present

## 2016-11-12 DIAGNOSIS — R262 Difficulty in walking, not elsewhere classified: Secondary | ICD-10-CM | POA: Diagnosis not present

## 2016-11-12 DIAGNOSIS — M25562 Pain in left knee: Secondary | ICD-10-CM | POA: Diagnosis not present

## 2016-11-12 DIAGNOSIS — M6281 Muscle weakness (generalized): Secondary | ICD-10-CM | POA: Diagnosis not present

## 2016-11-17 DIAGNOSIS — M25562 Pain in left knee: Secondary | ICD-10-CM | POA: Diagnosis not present

## 2016-11-17 DIAGNOSIS — M6281 Muscle weakness (generalized): Secondary | ICD-10-CM | POA: Diagnosis not present

## 2016-11-17 DIAGNOSIS — M25662 Stiffness of left knee, not elsewhere classified: Secondary | ICD-10-CM | POA: Diagnosis not present

## 2016-11-17 DIAGNOSIS — R262 Difficulty in walking, not elsewhere classified: Secondary | ICD-10-CM | POA: Diagnosis not present

## 2016-11-19 DIAGNOSIS — M25662 Stiffness of left knee, not elsewhere classified: Secondary | ICD-10-CM | POA: Diagnosis not present

## 2016-11-19 DIAGNOSIS — R262 Difficulty in walking, not elsewhere classified: Secondary | ICD-10-CM | POA: Diagnosis not present

## 2016-11-19 DIAGNOSIS — M6281 Muscle weakness (generalized): Secondary | ICD-10-CM | POA: Diagnosis not present

## 2016-11-19 DIAGNOSIS — M25562 Pain in left knee: Secondary | ICD-10-CM | POA: Diagnosis not present

## 2016-11-24 ENCOUNTER — Encounter: Payer: Self-pay | Admitting: Pulmonary Disease

## 2016-11-24 ENCOUNTER — Ambulatory Visit (INDEPENDENT_AMBULATORY_CARE_PROVIDER_SITE_OTHER): Payer: PPO | Admitting: Pulmonary Disease

## 2016-11-24 ENCOUNTER — Institutional Professional Consult (permissible substitution): Payer: PPO | Admitting: Pulmonary Disease

## 2016-11-24 VITALS — BP 140/68 | HR 71 | Wt 252.0 lb

## 2016-11-24 DIAGNOSIS — J4530 Mild persistent asthma, uncomplicated: Secondary | ICD-10-CM

## 2016-11-24 DIAGNOSIS — G4719 Other hypersomnia: Secondary | ICD-10-CM

## 2016-11-24 DIAGNOSIS — G4733 Obstructive sleep apnea (adult) (pediatric): Secondary | ICD-10-CM

## 2016-11-24 NOTE — Patient Instructions (Signed)
Continue Flovent inhaler twice a day on a regular basis (at least through the pollen season)  Continue albuterol inhaler as needed (first line)  Continue albuterol nebulizer as needed (second line)  If symptoms of asthma flare and persist for more than a couple of consecutive days, call our office to be seen  Sleep study has been arranged  We will contact you after sleep study results are available to [place]  We will arrange follow up

## 2016-11-25 NOTE — Progress Notes (Addendum)
PULMONARY CONSULT NOTE  Requesting MD/Service: Miguel Aschoff, M.D. Date of initial consultation: 11/24/16 Reason for consultation: History of asthma, shortness of breath  PT PROFILE: 66 y.o. male former smoker (remote, minimal) with a history of mild persistent asthma initially diagnosed in his 64s and obstructive sleep apnea  DATA:   HPI:  66 y.o. male former smoker (remote, minimal) with a history of asthma which was initially diagnosed in his 19s. At his baseline he has grade I/II dyspnea. He attributes this mostly to his obesity.. He's never been hospitalized with asthma. He has very infrequent exacerbations. He's been maintained on a Flovent inhaler and albuterol rescue inhaler. He has an albuterol nebulizer at home which she rarely uses. In early February he suffered a flulike illness and presented to the emergency department with wheezing and shortness of breath. He was treated with nebulized bronchodilators, improved and was discharged to home. He is now back to his baseline. He also reports a history of obstructive sleep apnea, first diagnosed more than 10 years ago. He has a CPAP machine at home but it is not functioning properly. He wishes for this problem to be addressed during this visit as well. His Epworth sleepiness scale score on the day of this visit was 17  Past Medical History:  Diagnosis Date  . Arthritis   . Asthma   . Family history of adverse reaction to anesthesia    Patients mother went into cardiac arrest twice during surgery 20 years ago  . GERD (gastroesophageal reflux disease)   . Hypertension   . Pneumonia    hx of  . Primary localized osteoarthritis of left knee 08/26/2016  . Primary localized osteoarthritis of right knee   . Sleep apnea    wears CPAP nightly  . Urgency of urination     Past Surgical History:  Procedure Laterality Date  . APPENDECTOMY    . COLONOSCOPY  2007  . COLONOSCOPY W/ POLYPECTOMY    . COLONOSCOPY WITH PROPOFOL N/A 07/01/2016    Procedure: COLONOSCOPY WITH PROPOFOL;  Surgeon: Robert Bellow, MD;  Location: [facility] ENDOSCOPY;  Service: Endoscopy;  Laterality: N/A;  . HERNIA REPAIR     66 yrs old and 66 yrs old  . hydrocelectomy    . KNEE SURGERY Right   . SKIN GRAFT    . SKIN SPLIT GRAFT    . TOTAL KNEE ARTHROPLASTY Right 10/07/2015   Procedure: TOTAL KNEE ARTHROPLASTY;  Surgeon: Elsie Saas, MD;  Location: [place];  Service: Orthopedics;  Laterality: Right;  . TOTAL KNEE ARTHROPLASTY Left 09/07/2016   Procedure: TOTAL KNEE ARTHROPLASTY;  Surgeon: Elsie Saas, MD;  Location: [place];  Service: Orthopedics;  Laterality: Left;    MEDICATIONS: I have reviewed all medications and confirmed regimen as documented  Social History   Social History  . Marital status: Married    Spouse name: Pamala Hurry  . Number of children: N/A  . Years of education: N/A   Occupational History  . Not on file.   Social History Main Topics  . Smoking status: Former Smoker    Types: Cigarettes  . Smokeless tobacco: Never Used     Comment: > 40 years ago  . Alcohol use No  . Drug use: No  . Sexual activity: Yes   Other Topics Concern  . Not on file   Social History Narrative  . No narrative on file    Family History  Problem Relation Age of Onset  . Heart attack Mother   .  Heart failure Mother   . Diabetes Mother   . Hypertension Mother   . Heart disease Mother   . Heart attack Sister   . Heart disease Sister   . Diabetes Sister   . Heart disease Father   . Cancer Father   . Diabetes Son   . Heart attack Maternal Grandfather   . Heart disease Son     ROS: No fever, myalgias/arthralgias, unexplained weight loss or weight gain No new focal weakness or sensory deficits No otalgia, hearing loss, visual changes, nasal and sinus symptoms, mouth and throat problems No neck pain or adenopathy No abdominal pain, N/V/D, diarrhea, change in bowel pattern No dysuria, change in urinary pattern   Vitals:   11/24/16  1206 11/24/16 1207  BP:  140/68  Pulse:  71  SpO2:  94%  Weight: 114.3 kg (252 lb)   Room air   EXAM:  Gen: Obese, No overt respiratory distress HEENT: NCAT, sclera white, oropharynx normal Neck: Supple without LAN, thyromegaly, JVD Lungs: breath sounds full with normal percussion note and no wheezes or other adventitious sounds Cardiovascular: RRR, no murmurs noted Abdomen: Centripetal obesity, Soft, nontender, normal BS Ext: without clubbing, cyanosis, edema Neuro: CNs grossly intact, motor and sensory intact Skin: Limited exam, no lesions noted  DATA:   BMP Latest Ref Rng & Units 09/08/2016 08/27/2016 08/27/2016  Glucose 65 - 99 mg/dL 167(H) 102(H) 98  BUN 6 - 20 mg/dL 17 19 19   Creatinine 0.61 - 1.24 mg/dL 1.21 1.25(H) 1.20  BUN/Creat Ratio 10 - 24 - - 16  Sodium 135 - 145 mmol/L 136 141 142  Potassium 3.5 - 5.1 mmol/L 4.2 3.8 4.2  Chloride 101 - 111 mmol/L 101 106 99  CO2 22 - 32 mmol/L 25 27 26   Calcium 8.9 - 10.3 mg/dL 8.9 9.2 9.8    CBC Latest Ref Rng & Units 09/08/2016 08/27/2016 08/27/2016  WBC 4.0 - 10.5 K/uL 14.9(H) 8.1 7.2  Hemoglobin 13.0 - 17.0 g/dL 14.3 15.8 -  Hematocrit 39.0 - 52.0 % 41.4 46.1 48.7  Platelets 150 - 400 K/uL 162 163 182    CXR (10/02/16):  Eventration of the right hemidiaphragm. No acute findings  IMPRESSION:     ICD-9-CM ICD-10-CM   1. Excessive daytime sleepiness 780.54 G47.19 Split night study  2. OSA (obstructive sleep apnea) 327.23 G47.33   3. Mild persistent asthma without complication 485.46 E70.35   4. Morbid obesity ([place]) 278.01 E66.01      PLAN:  1) continue Flovent inhaler twice a day on a regular basis (at least during the pollen seasons) 2) continue albuterol inhaler as needed (first line rescue therapy) 3) continue nebulized albuterol as needed (second line rescue therapy) 4) if symptoms of asthma exacerbation develop and persist more than a couple of days consecutively, he is instructed to call our office 5) sleep study  has been ordered 6) our office will contact him after the sleep study results are available and we will arrange therapy and follow-up. He will need a new CPAP machine which will be arranged once the diagnosis is confirmed  Merton Border, MD PCCM service Mobile [phone] Pager [phone] 11/25/2016  Addendum: Patient has a nonfunctional CPAP machine and is unable to obtain replacement parts.Merton Border, MD PCCM service Mobile [phone] Pager [phone] 11/25/2016

## 2016-12-02 DIAGNOSIS — M25562 Pain in left knee: Secondary | ICD-10-CM | POA: Diagnosis not present

## 2016-12-15 DIAGNOSIS — N529 Male erectile dysfunction, unspecified: Secondary | ICD-10-CM

## 2016-12-15 HISTORY — DX: Male erectile dysfunction, unspecified: N52.9

## 2016-12-22 ENCOUNTER — Ambulatory Visit: Payer: PPO | Attending: Internal Medicine

## 2016-12-22 ENCOUNTER — Encounter: Payer: Self-pay | Admitting: Internal Medicine

## 2016-12-22 DIAGNOSIS — G4733 Obstructive sleep apnea (adult) (pediatric): Secondary | ICD-10-CM | POA: Insufficient documentation

## 2016-12-22 DIAGNOSIS — G4719 Other hypersomnia: Secondary | ICD-10-CM

## 2016-12-24 DIAGNOSIS — G4733 Obstructive sleep apnea (adult) (pediatric): Secondary | ICD-10-CM | POA: Diagnosis not present

## 2016-12-25 ENCOUNTER — Telehealth: Payer: Self-pay | Admitting: *Deleted

## 2016-12-25 DIAGNOSIS — G4733 Obstructive sleep apnea (adult) (pediatric): Secondary | ICD-10-CM

## 2016-12-25 NOTE — Telephone Encounter (Signed)
-----   Message from Laverle Hobby, MD sent at 12/24/2016  3:48 PM EDT ----- Regarding: split night resuls Sleep study positive, cpap titrated. Auto CPAP to be initiated in the home with a pressure range of 12-18 cm H2O.

## 2016-12-25 NOTE — Telephone Encounter (Signed)
Tried to call pt to give sleep results but no answer and no VM available.

## 2016-12-29 NOTE — Telephone Encounter (Signed)
Pt informed of sleep study. Order placed for auto CPAP. Nothing further needed.

## 2017-01-12 DIAGNOSIS — G4733 Obstructive sleep apnea (adult) (pediatric): Secondary | ICD-10-CM | POA: Diagnosis not present

## 2017-01-29 NOTE — Addendum Note (Signed)
Addendum  created 01/29/17 3685 by Lyn Hollingshead, MD   Sign clinical note

## 2017-02-10 ENCOUNTER — Encounter: Payer: Self-pay | Admitting: Pulmonary Disease

## 2017-02-12 DIAGNOSIS — G4733 Obstructive sleep apnea (adult) (pediatric): Secondary | ICD-10-CM | POA: Diagnosis not present

## 2017-03-01 ENCOUNTER — Encounter: Payer: Self-pay | Admitting: Pulmonary Disease

## 2017-03-01 ENCOUNTER — Ambulatory Visit (INDEPENDENT_AMBULATORY_CARE_PROVIDER_SITE_OTHER): Payer: PPO | Admitting: Pulmonary Disease

## 2017-03-01 VITALS — BP 162/84 | HR 74 | Ht 68.0 in | Wt 257.0 lb

## 2017-03-01 DIAGNOSIS — G4733 Obstructive sleep apnea (adult) (pediatric): Secondary | ICD-10-CM | POA: Diagnosis not present

## 2017-03-01 DIAGNOSIS — E668 Other obesity: Secondary | ICD-10-CM

## 2017-03-01 NOTE — Patient Instructions (Signed)
Continue CPAP with current settings Weight loss strategies as we discussed Follow-up in 6 months or sooner as needed

## 2017-03-02 NOTE — Progress Notes (Signed)
PULMONARY OFFICE FOLLOW UP NOTE  Requesting MD/Service: Miguel Aschoff, M.D. Date of initial consultation: 11/24/16 Reason for consultation: History of asthma, shortness of breath  PT PROFILE: 66 y.o. male former smoker (remote, minimal) with a history of mild persistent asthma initially diagnosed in his 36s and obstructive sleep apnea  DATA:  12/22/16 Split night PSG: AHI 42/hr. SpO2 nadir 66%. CPAP 17 cm H2O recommended 06/05-07/04/18 Compliance Report: 30/30 days. > 4 hrs 29/30 days  SUBJ:  Since last visit, has been started on CPAP with PSG results and compliance results as above. He is much improved with near total resolution of his daytime sleepiness nad mental fog. No new complaints   Vitals:   03/01/17 1127  BP: (!) 162/84  Pulse: 74  SpO2: 92%  Weight: 257 lb (116.6 kg)  Height: 5\' 8"  (1.727 m)  Room air   EXAM:  Gen: NAD HEENT: WNL Neck: No JVD Lungs: Full BS, no wheezes Cardiovascular: Reg, no M Abdomen: Soft, nontender, normal BS Ext: without clubbing, cyanosis, edema Neuro: grossly intact  DATA:   BMP Latest Ref Rng & Units 09/08/2016 08/27/2016 08/27/2016  Glucose 65 - 99 mg/dL 167(H) 102(H) 98  BUN 6 - 20 mg/dL 17 19 19   Creatinine 0.61 - 1.24 mg/dL 1.21 1.25(H) 1.20  BUN/Creat Ratio 10 - 24 - - 16  Sodium 135 - 145 mmol/L 136 141 142  Potassium 3.5 - 5.1 mmol/L 4.2 3.8 4.2  Chloride 101 - 111 mmol/L 101 106 99  CO2 22 - 32 mmol/L 25 27 26   Calcium 8.9 - 10.3 mg/dL 8.9 9.2 9.8    CBC Latest Ref Rng & Units 09/08/2016 08/27/2016 08/27/2016  WBC 4.0 - 10.5 K/uL 14.9(H) 8.1 7.2  Hemoglobin 13.0 - 17.0 g/dL 14.3 15.8 16.7  Hematocrit 39.0 - 52.0 % 41.4 46.1 48.7  Platelets 150 - 400 K/uL 162 163 182    CXR: NNF  IMPRESSION:     ICD-10-CM   1. OSA (obstructive sleep apnea) G47.33   2. Moderate obesity E66.8      PLAN:  Continue CPAP with current settings Weight loss strategies discussed Follow-up in 6 months or sooner as needed  Merton Border,  MD PCCM service Mobile [phone] Pager [phone] 03/02/2017 9:33 PM

## 2017-03-03 DIAGNOSIS — M25562 Pain in left knee: Secondary | ICD-10-CM | POA: Diagnosis not present

## 2017-03-08 IMAGING — CR DG KNEE AP/LAT W/ SUNRISE*R*
1 series · 1 of 1 positions shown · non-contrast
Comparison: None.

CLINICAL DATA: Bilateral knee pain, no acute injury

EXAM:
RIGHT KNEE 3 VIEWS

[dg knee bilateral standing ap]
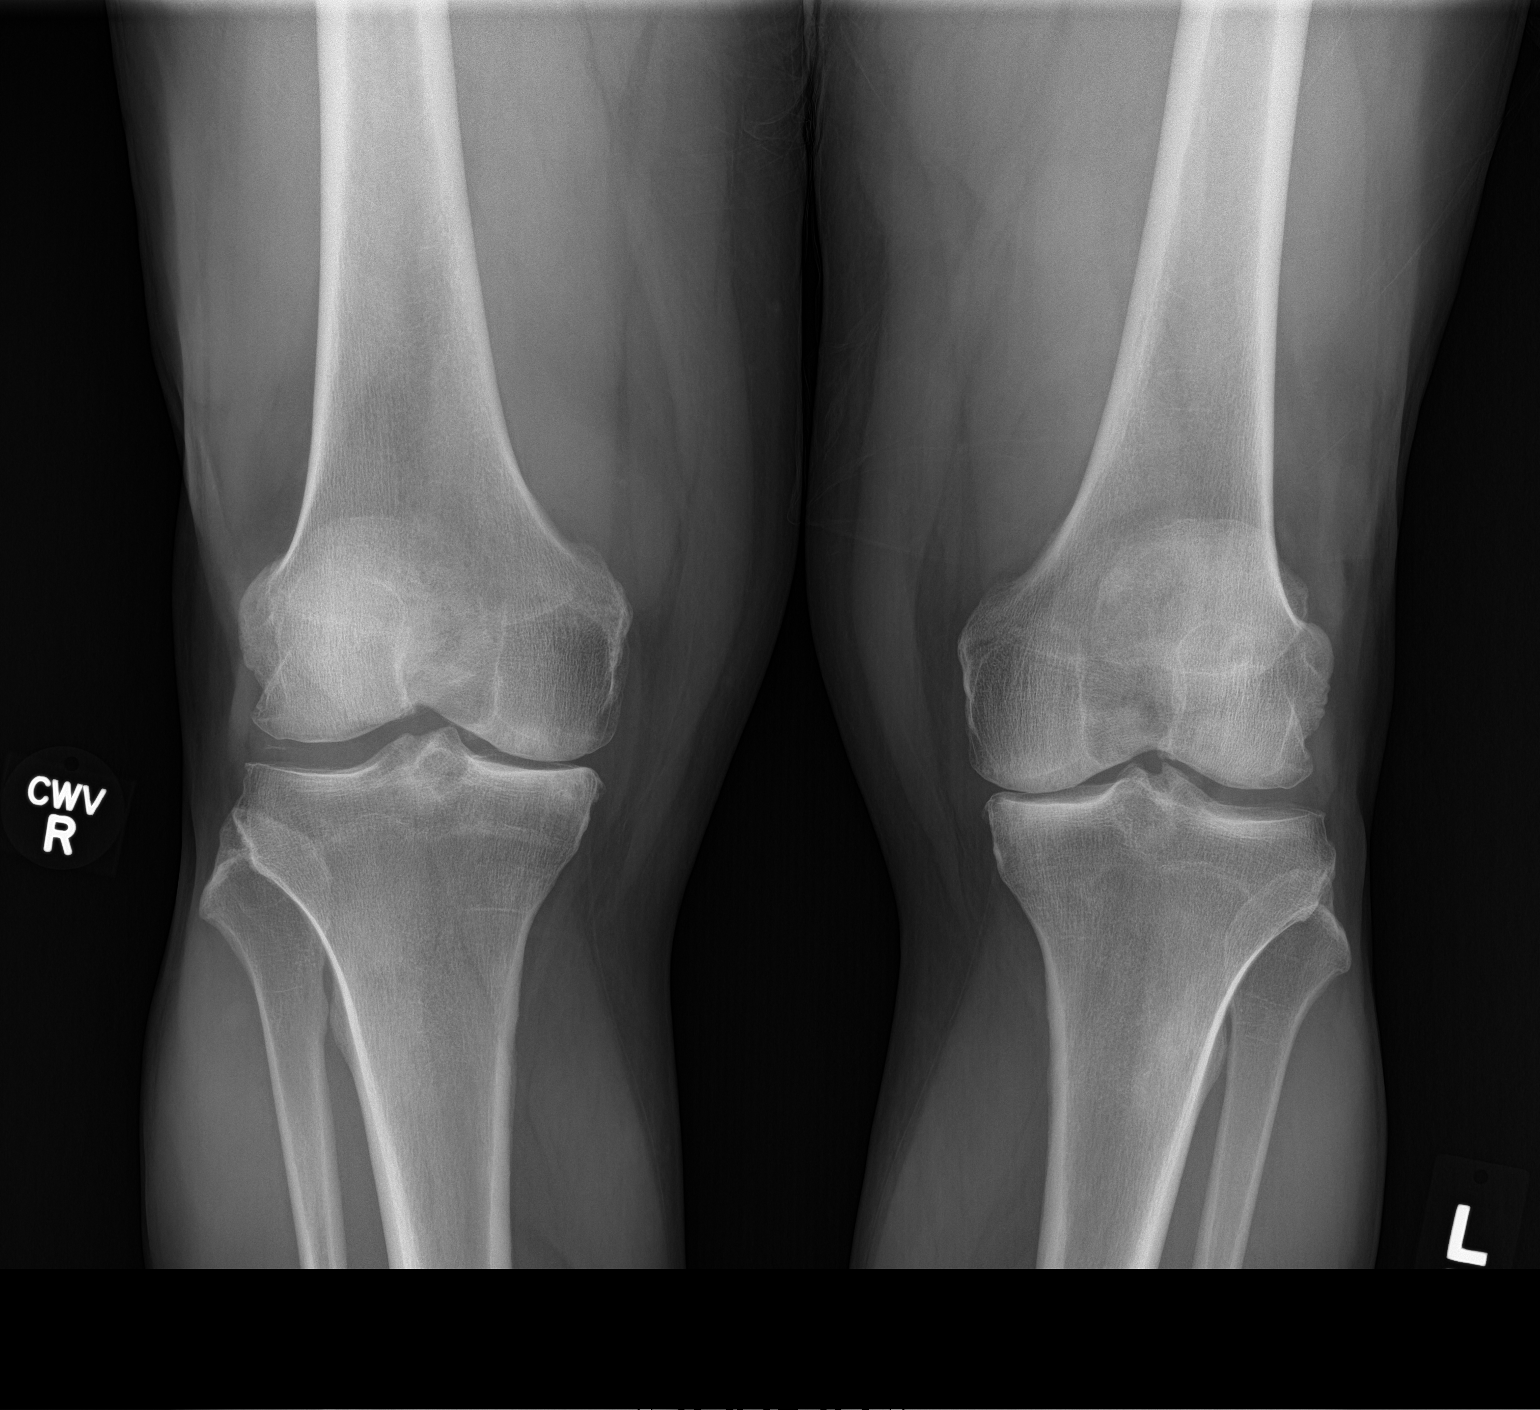

[1 of 1 positions shown; findings below may reference images not displayed]

FINDINGS: There is bicompartmental degenerative joint disease of the left knee
primarily involving the medial compartment. There is almost complete
loss of joint space medially with sclerosis and subchondral cyst
formation. Lesser degenerative change is noted involving the
patellofemoral compartment. The left patella is normally positioned.
IMPRESSION: Bicompartmental degenerative joint disease primarily involving the
medial compartment.

## 2017-03-08 IMAGING — CR DG KNEE AP/LAT W/ SUNRISE*R*
1 series · 3 of 3 positions shown · non-contrast
Comparison: None.

CLINICAL DATA: Bilateral knee pain, no acute injury

EXAM:
RIGHT KNEE 3 VIEWS

[Series 1: view not recorded · 0.14mm/px · 3 of 3 slices shown]
[im 1/3]
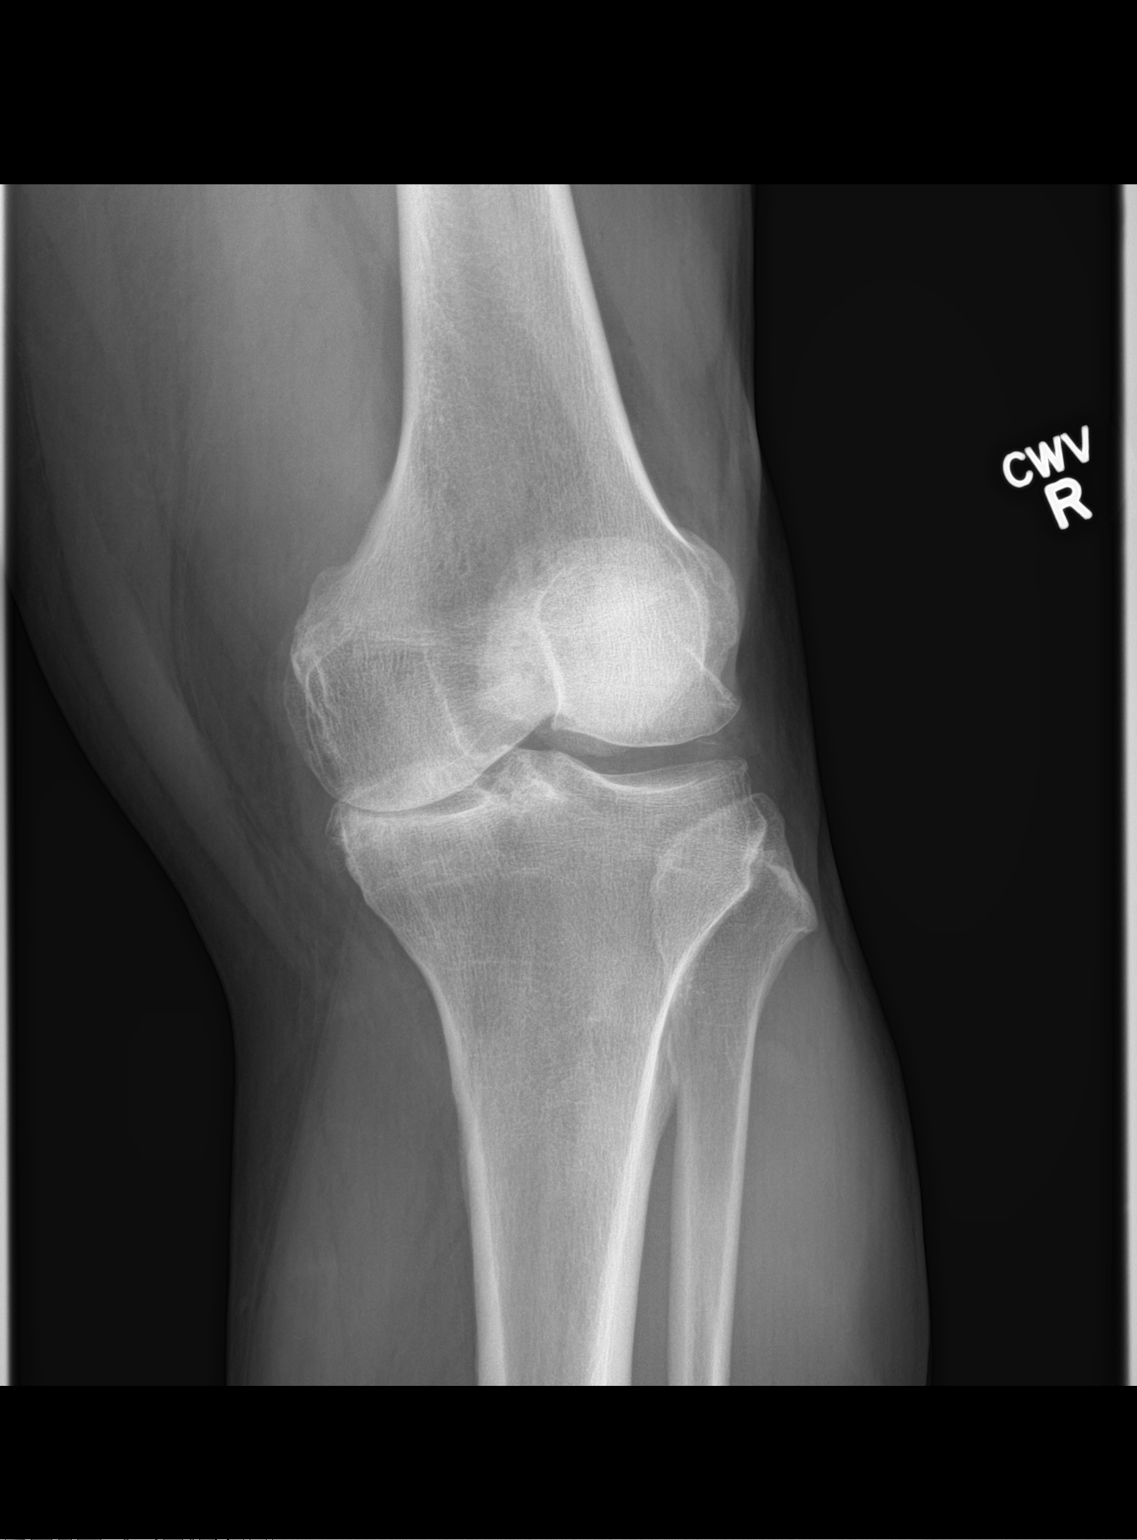
[im 2/3]
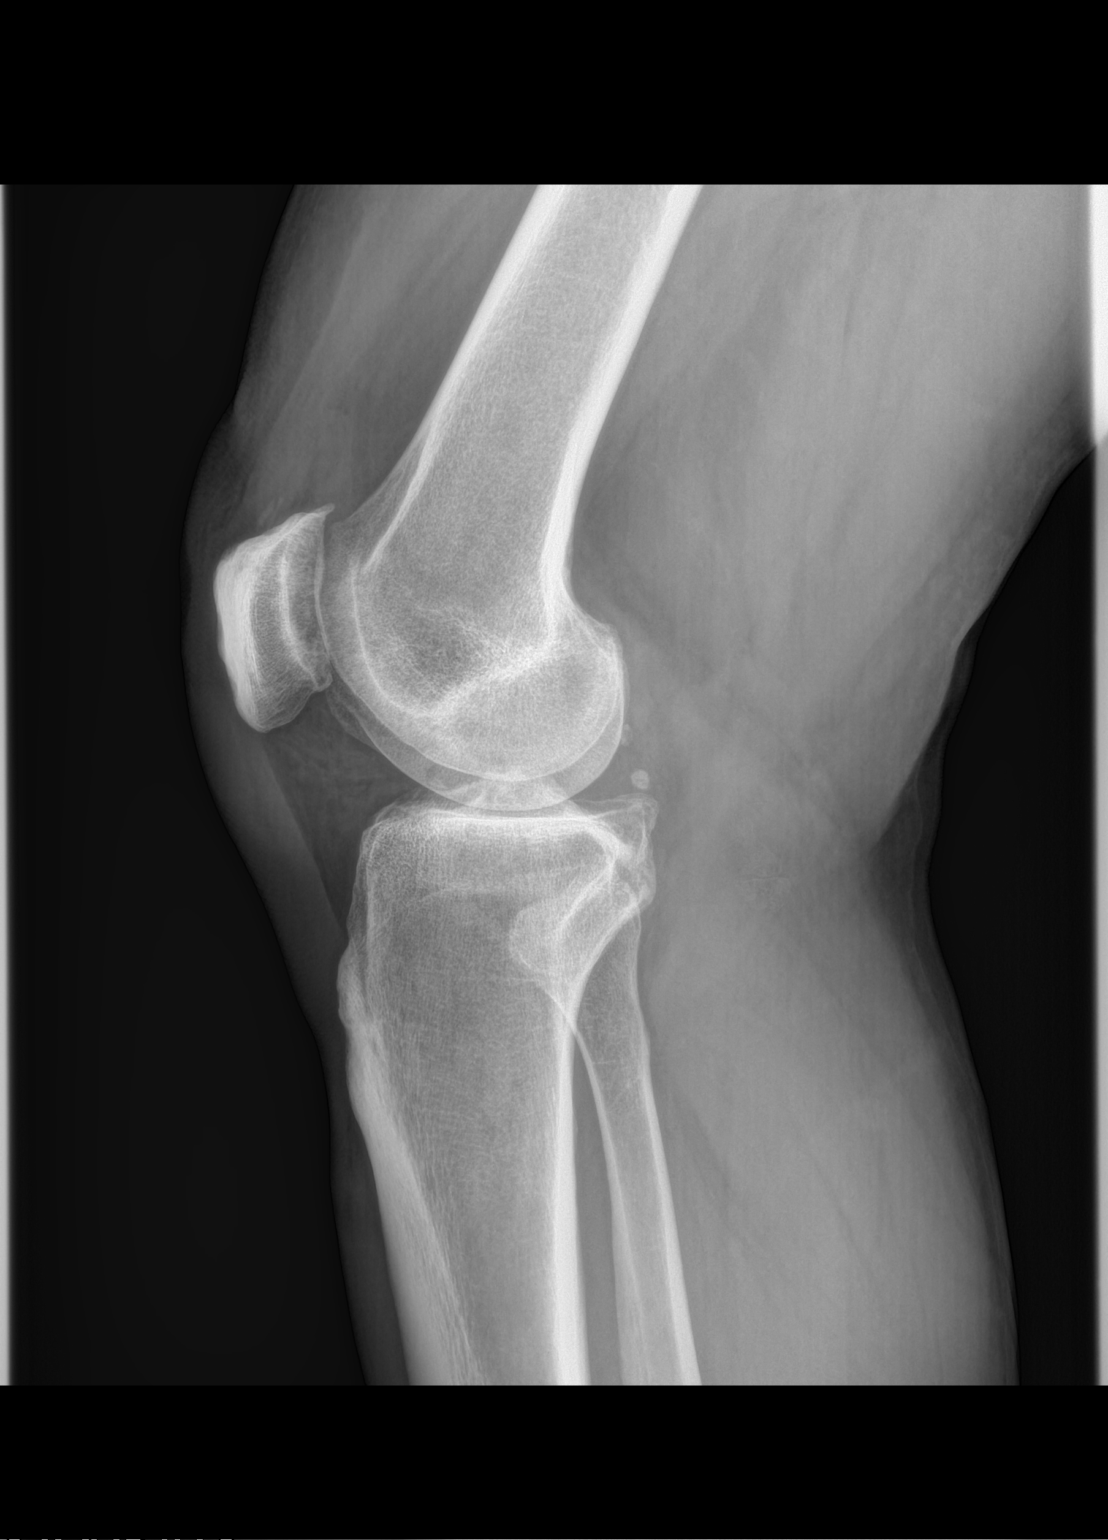
[im 3/3]
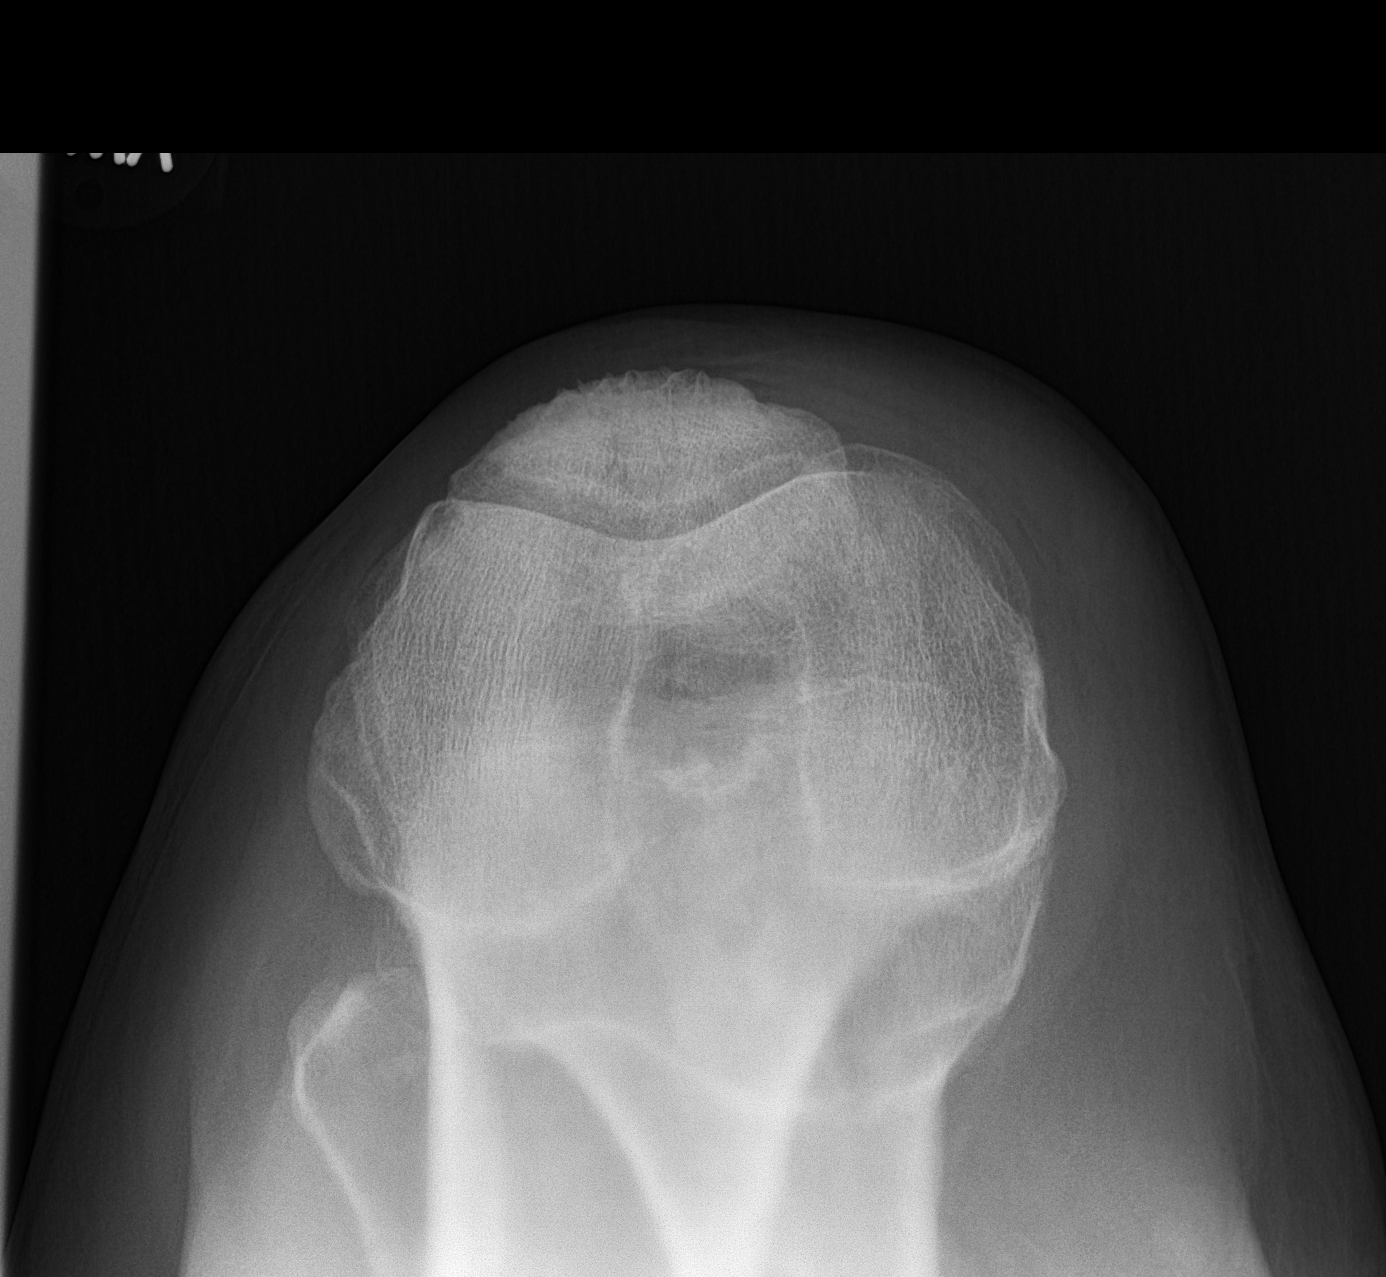

[3 of 3 positions shown; findings below may reference images not displayed]

FINDINGS: There is bicompartmental degenerative joint disease of the left knee
primarily involving the medial compartment. There is almost complete
loss of joint space medially with sclerosis and subchondral cyst
formation. Lesser degenerative change is noted involving the
patellofemoral compartment. The left patella is normally positioned.
IMPRESSION: Bicompartmental degenerative joint disease primarily involving the
medial compartment.

## 2017-03-14 DIAGNOSIS — G4733 Obstructive sleep apnea (adult) (pediatric): Secondary | ICD-10-CM | POA: Diagnosis not present

## 2017-04-14 DIAGNOSIS — L400 Psoriasis vulgaris: Secondary | ICD-10-CM | POA: Diagnosis not present

## 2017-04-14 DIAGNOSIS — L570 Actinic keratosis: Secondary | ICD-10-CM | POA: Diagnosis not present

## 2017-04-14 DIAGNOSIS — L720 Epidermal cyst: Secondary | ICD-10-CM | POA: Diagnosis not present

## 2017-04-14 DIAGNOSIS — L918 Other hypertrophic disorders of the skin: Secondary | ICD-10-CM | POA: Diagnosis not present

## 2017-04-14 DIAGNOSIS — L820 Inflamed seborrheic keratosis: Secondary | ICD-10-CM | POA: Diagnosis not present

## 2017-04-14 DIAGNOSIS — G4733 Obstructive sleep apnea (adult) (pediatric): Secondary | ICD-10-CM | POA: Diagnosis not present

## 2017-04-14 DIAGNOSIS — D1801 Hemangioma of skin and subcutaneous tissue: Secondary | ICD-10-CM | POA: Diagnosis not present

## 2017-04-14 DIAGNOSIS — L821 Other seborrheic keratosis: Secondary | ICD-10-CM | POA: Diagnosis not present

## 2017-04-14 DIAGNOSIS — D485 Neoplasm of uncertain behavior of skin: Secondary | ICD-10-CM | POA: Diagnosis not present

## 2017-05-15 DIAGNOSIS — G4733 Obstructive sleep apnea (adult) (pediatric): Secondary | ICD-10-CM | POA: Diagnosis not present

## 2017-06-08 ENCOUNTER — Other Ambulatory Visit: Payer: Self-pay | Admitting: Family Medicine

## 2017-06-14 ENCOUNTER — Other Ambulatory Visit: Payer: Self-pay | Admitting: Family Medicine

## 2017-06-14 DIAGNOSIS — G4733 Obstructive sleep apnea (adult) (pediatric): Secondary | ICD-10-CM | POA: Diagnosis not present

## 2017-06-16 DIAGNOSIS — D485 Neoplasm of uncertain behavior of skin: Secondary | ICD-10-CM | POA: Diagnosis not present

## 2017-06-16 DIAGNOSIS — L859 Epidermal thickening, unspecified: Secondary | ICD-10-CM | POA: Diagnosis not present

## 2017-06-16 DIAGNOSIS — L821 Other seborrheic keratosis: Secondary | ICD-10-CM | POA: Diagnosis not present

## 2017-06-16 DIAGNOSIS — L570 Actinic keratosis: Secondary | ICD-10-CM | POA: Diagnosis not present

## 2017-06-28 DIAGNOSIS — G4733 Obstructive sleep apnea (adult) (pediatric): Secondary | ICD-10-CM | POA: Diagnosis not present

## 2017-07-15 DIAGNOSIS — G4733 Obstructive sleep apnea (adult) (pediatric): Secondary | ICD-10-CM | POA: Diagnosis not present

## 2017-07-26 ENCOUNTER — Telehealth: Payer: Self-pay

## 2017-07-26 NOTE — Telephone Encounter (Signed)
LMTCB and schedule AWV with [facility] and CPE for 08/2017.

## 2017-08-14 DIAGNOSIS — G4733 Obstructive sleep apnea (adult) (pediatric): Secondary | ICD-10-CM | POA: Diagnosis not present

## 2017-08-27 IMAGING — US US EXTREM LOW VENOUS*R*
1 series · 14 of 24 positions shown · non-contrast
Comparison: MR 08/23/2015

CLINICAL DATA: Pain x6 weeks

EXAM:
RIGHT LOWER EXTREMITY VENOUS DOPPLER ULTRASOUND
TECHNIQUE: Gray-scale sonography with compression, as well as color and duplex
ultrasound, were performed to evaluate the deep venous system from
the level of the common femoral vein through the popliteal and
proximal calf veins.

[Series 1: us extrem low venous*right* · 0.09mm/px · 14 of 34 slices shown]
[im 1/34]
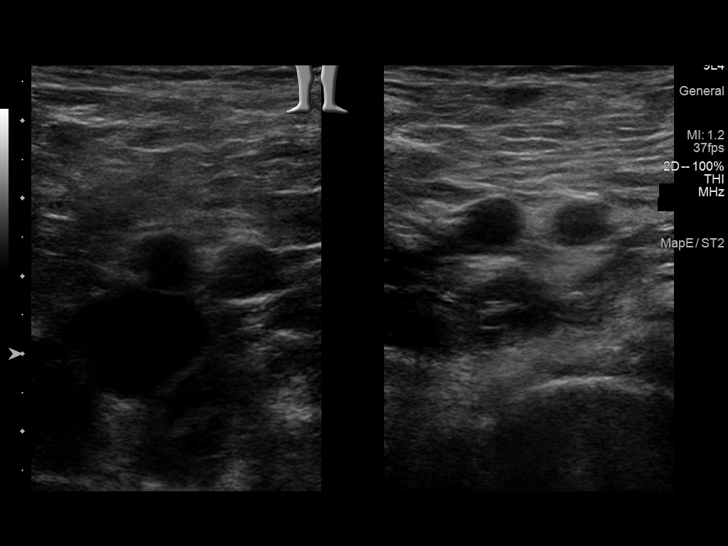
[im 3/34]
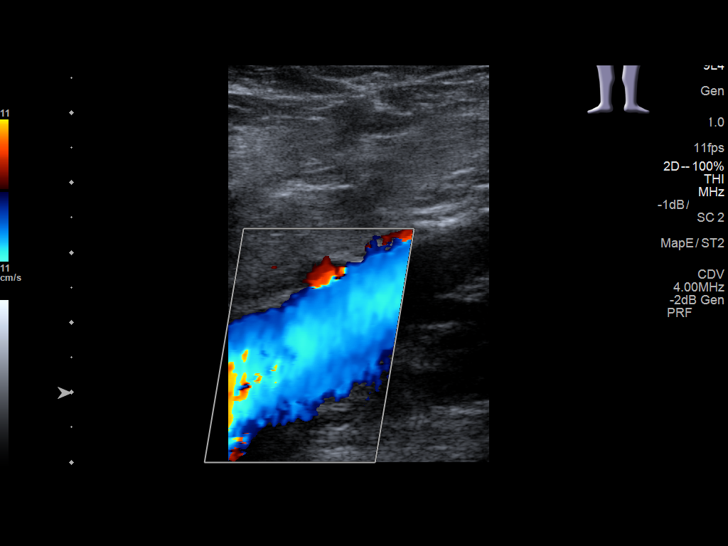
[im 6/34]
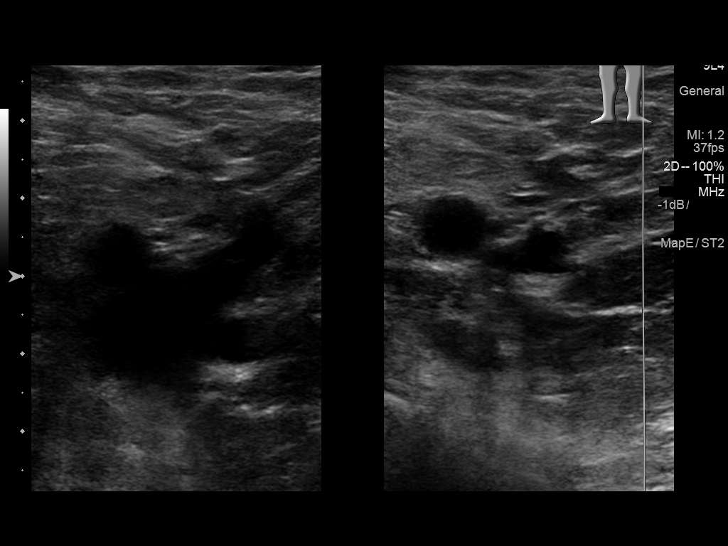
[im 9/34]
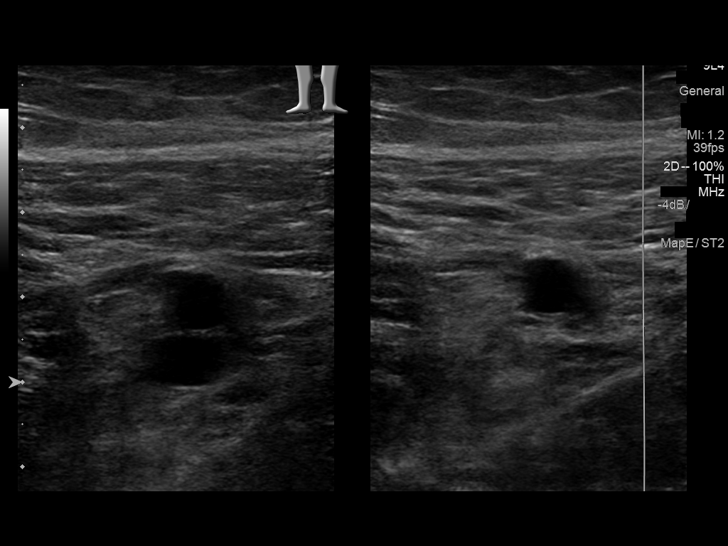
[im 11/34]
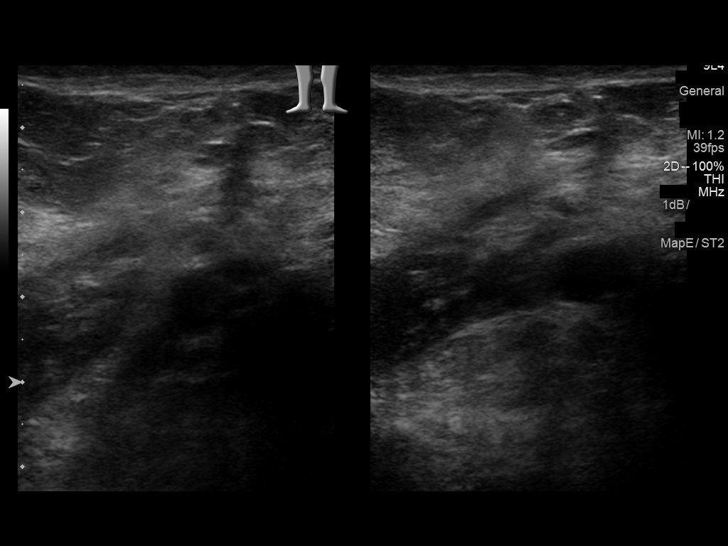
[im 13/34]
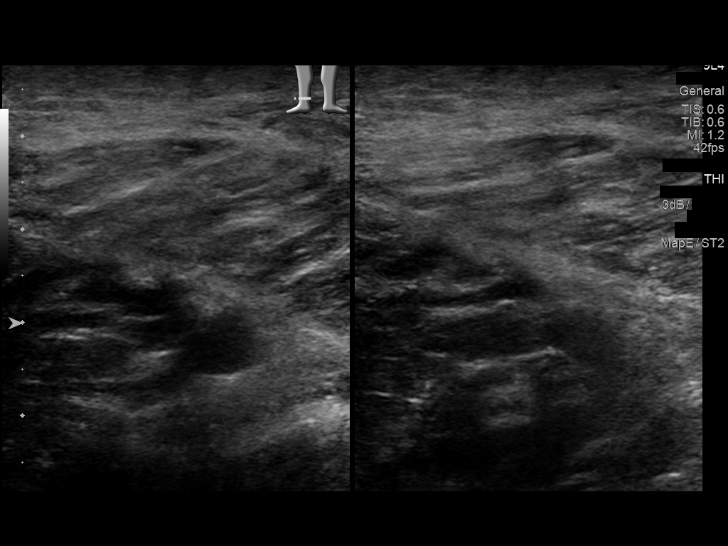
[im 16/34]
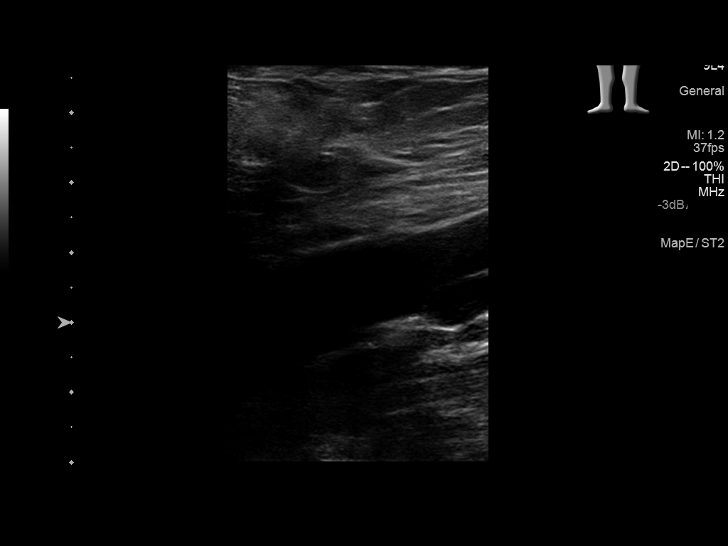
[im 18/34]
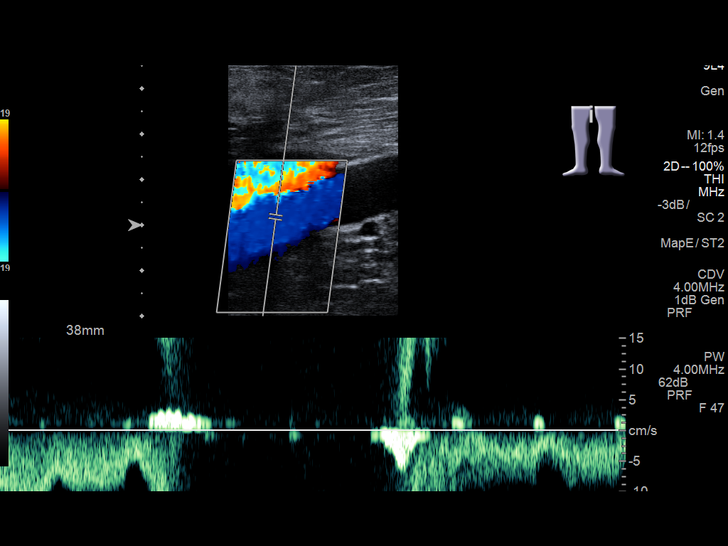
[im 21/34]
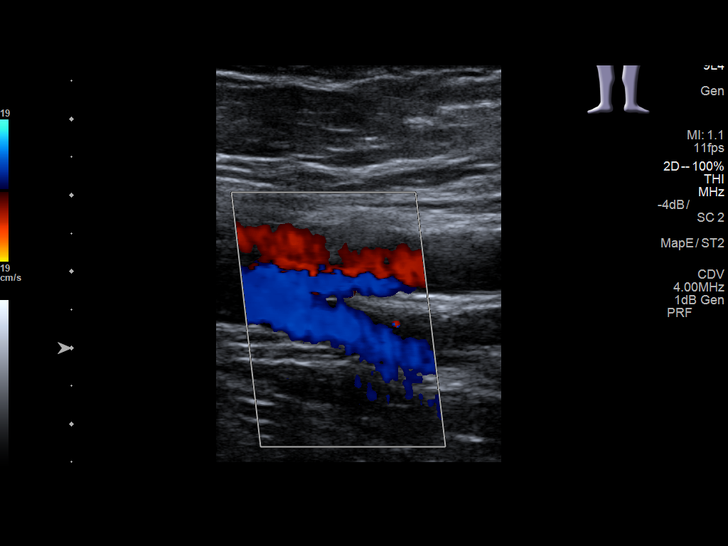
[im 23/34]
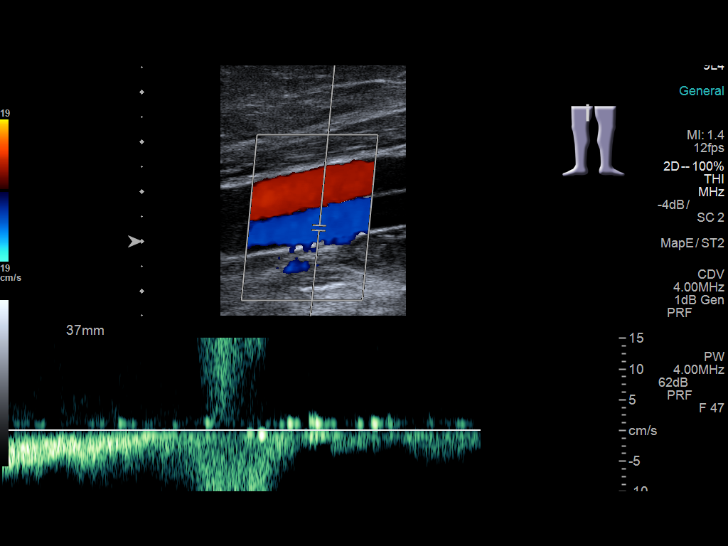
[im 26/34]
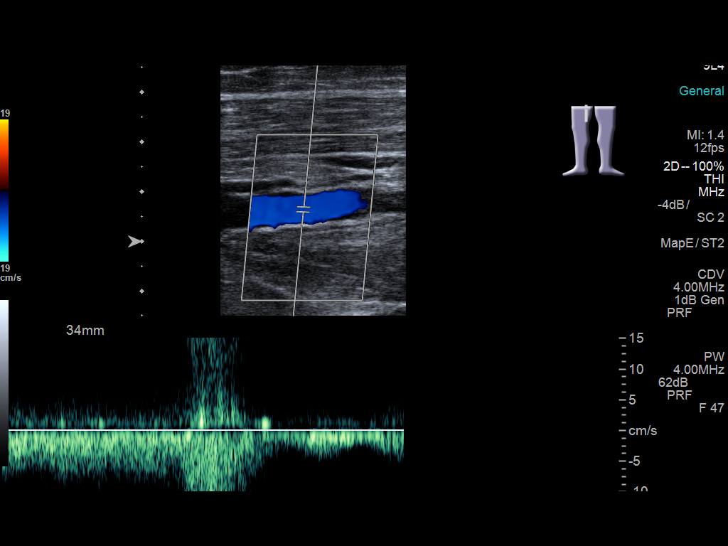
[im 28/34]
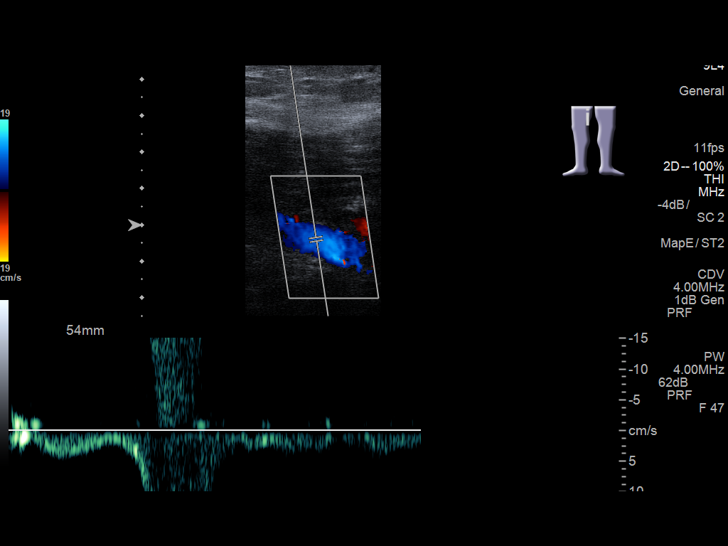
[im 31/34]
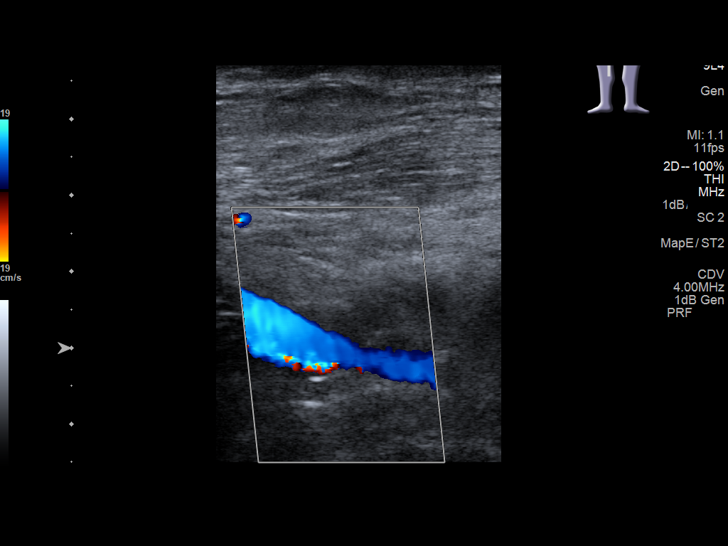
[im 34/34]
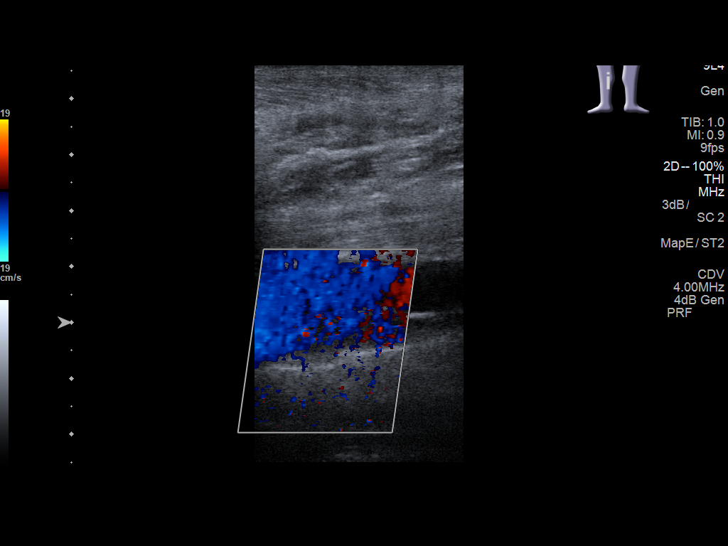

[14 of 24 positions shown; findings below may reference images not displayed]

FINDINGS: Normal compressibility of the common femoral, superficial femoral,
and popliteal veins, as well as the proximal calf veins. No filling
defects to suggest DVT on grayscale or color Doppler imaging.
Doppler waveforms show normal direction of venous flow, normal
respiratory phasicity and response to augmentation. Survey views of
the contralateral common femoral vein are unremarkable.
IMPRESSION: 1. No evidence of lower extremity deep vein thrombosis, right.

## 2017-09-01 ENCOUNTER — Ambulatory Visit (INDEPENDENT_AMBULATORY_CARE_PROVIDER_SITE_OTHER): Payer: PPO

## 2017-09-01 VITALS — BP 138/80 | HR 68 | Temp 97.9°F | Ht 68.0 in | Wt 254.2 lb

## 2017-09-01 DIAGNOSIS — Z0000 Encounter for general adult medical examination without abnormal findings: Secondary | ICD-10-CM | POA: Diagnosis not present

## 2017-09-01 NOTE — Patient Instructions (Signed)
Kenneth Hester , Thank you for taking time to come for your Medicare Wellness Visit. I appreciate your ongoing commitment to your health goals. Please review the following plan we discussed and let me know if I can assist you in the future.   Screening recommendations/referrals: Colonoscopy: Up to date Recommended yearly ophthalmology/optometry visit for glaucoma screening and checkup Recommended yearly dental visit for hygiene and checkup  Vaccinations: Influenza vaccine: Pt declines today.  Pneumococcal vaccine: Pt has received Prevnar 13 but declines the Pneumovax. Tdap vaccine: Up to date Shingles vaccine: Pt declines today.     Advanced directives: Advance directive discussed with you today. Even though you declined this today please call our office should you change your mind and we can give you the proper paperwork for you to fill out.  Conditions/risks identified: Fall risk prevention; Obesity- recommend starting back exercising for at least 150 minutes a week.   Next appointment: 10/13/17 @ 8:00 AM  Preventive Care 65 Years and Older, Male Preventive care refers to lifestyle choices and visits with your health care provider that can promote health and wellness. What does preventive care include?  A yearly physical exam. This is also called an annual well check.  Dental exams once or twice a year.  Routine eye exams. Ask your health care provider how often you should have your eyes checked.  Personal lifestyle choices, including:  Daily care of your teeth and gums.  Regular physical activity.  Eating a healthy diet.  Avoiding tobacco and drug use.  Limiting alcohol use.  Practicing safe sex.  Taking low doses of aspirin every day.  Taking vitamin and mineral supplements as recommended by your health care provider. What happens during an annual well check? The services and screenings done by your health care provider during your annual well check will depend on  your age, overall health, lifestyle risk factors, and family history of disease. Counseling  Your health care provider may ask you questions about your:  Alcohol use.  Tobacco use.  Drug use.  Emotional well-being.  Home and relationship well-being.  Sexual activity.  Eating habits.  History of falls.  Memory and ability to understand (cognition).  Work and work Statistician. Screening  You may have the following tests or measurements:  Height, weight, and BMI.  Blood pressure.  Lipid and cholesterol levels. These may be checked every 5 years, or more frequently if you are over 37 years old.  Skin check.  Lung cancer screening. You may have this screening every year starting at age 81 if you have a 30-pack-year history of smoking and currently smoke or have quit within the past 15 years.  Fecal occult blood test (FOBT) of the stool. You may have this test every year starting at age 60.  Flexible sigmoidoscopy or colonoscopy. You may have a sigmoidoscopy every 5 years or a colonoscopy every 10 years starting at age 25.  Prostate cancer screening. Recommendations will vary depending on your family history and other risks.  Hepatitis C blood test.  Hepatitis B blood test.  Sexually transmitted disease (STD) testing.  Diabetes screening. This is done by checking your blood sugar (glucose) after you have not eaten for a while (fasting). You may have this done every 1-3 years.  Abdominal aortic aneurysm (AAA) screening. You may need this if you are a current or former smoker.  Osteoporosis. You may be screened starting at age 69 if you are at high risk. Talk with your health care provider about  your test results, treatment options, and if necessary, the need for more tests. Vaccines  Your health care provider may recommend certain vaccines, such as:  Influenza vaccine. This is recommended every year.  Tetanus, diphtheria, and acellular pertussis (Tdap, Td) vaccine.  You may need a Td booster every 10 years.  Zoster vaccine. You may need this after age 66.  Pneumococcal 13-valent conjugate (PCV13) vaccine. One dose is recommended after age 52.  Pneumococcal polysaccharide (PPSV23) vaccine. One dose is recommended after age 85. Talk to your health care provider about which screenings and vaccines you need and how often you need them. This information is not intended to replace advice given to you by your health care provider. Make sure you discuss any questions you have with your health care provider. Document Released: 09/06/2015 Document Revised: 04/29/2016 Document Reviewed: 06/11/2015 Elsevier Interactive Patient Education  2017 [place] Prevention in the Home Falls can cause injuries. They can happen to people of all ages. There are many things you can do to make your home safe and to help prevent falls. What can I do on the outside of my home?  Regularly fix the edges of walkways and driveways and fix any cracks.  Remove anything that might make you trip as you walk through a door, such as a raised step or threshold.  Trim any bushes or trees on the path to your home.  Use bright outdoor lighting.  Clear any walking paths of anything that might make someone trip, such as rocks or tools.  Regularly check to see if handrails are loose or broken. Make sure that both sides of any steps have handrails.  Any raised decks and porches should have guardrails on the edges.  Have any leaves, snow, or ice cleared regularly.  Use sand or salt on walking paths during winter.  Clean up any spills in your garage right away. This includes oil or grease spills. What can I do in the bathroom?  Use night lights.  Install grab bars by the toilet and in the tub and shower. Do not use towel bars as grab bars.  Use non-skid mats or decals in the tub or shower.  If you need to sit down in the shower, use a plastic, non-slip stool.  Keep the  floor dry. Clean up any water that spills on the floor as soon as it happens.  Remove soap buildup in the tub or shower regularly.  Attach bath mats securely with double-sided non-slip rug tape.  Do not have throw rugs and other things on the floor that can make you trip. What can I do in the bedroom?  Use night lights.  Make sure that you have a light by your bed that is easy to reach.  Do not use any sheets or blankets that are too big for your bed. They should not hang down onto the floor.  Have a firm chair that has side arms. You can use this for support while you get dressed.  Do not have throw rugs and other things on the floor that can make you trip. What can I do in the kitchen?  Clean up any spills right away.  Avoid walking on wet floors.  Keep items that you use a lot in easy-to-reach places.  If you need to reach something above you, use a strong step stool that has a grab bar.  Keep electrical cords out of the way.  Do not use floor polish or wax  that makes floors slippery. If you must use wax, use non-skid floor wax.  Do not have throw rugs and other things on the floor that can make you trip. What can I do with my stairs?  Do not leave any items on the stairs.  Make sure that there are handrails on both sides of the stairs and use them. Fix handrails that are broken or loose. Make sure that handrails are as long as the stairways.  Check any carpeting to make sure that it is firmly attached to the stairs. Fix any carpet that is loose or worn.  Avoid having throw rugs at the top or bottom of the stairs. If you do have throw rugs, attach them to the floor with carpet tape.  Make sure that you have a light switch at the top of the stairs and the bottom of the stairs. If you do not have them, ask someone to add them for you. What else can I do to help prevent falls?  Wear shoes that:  Do not have high heels.  Have rubber bottoms.  Are comfortable and fit  you well.  Are closed at the toe. Do not wear sandals.  If you use a stepladder:  Make sure that it is fully opened. Do not climb a closed stepladder.  Make sure that both sides of the stepladder are locked into place.  Ask someone to hold it for you, if possible.  Clearly mark and make sure that you can see:  Any grab bars or handrails.  First and last steps.  Where the edge of each step is.  Use tools that help you move around (mobility aids) if they are needed. These include:  Canes.  Walkers.  Scooters.  Crutches.  Turn on the lights when you go into a dark area. Replace any light bulbs as soon as they burn out.  Set up your furniture so you have a clear path. Avoid moving your furniture around.  If any of your floors are uneven, fix them.  If there are any pets around you, be aware of where they are.  Review your medicines with your doctor. Some medicines can make you feel dizzy. This can increase your chance of falling. Ask your doctor what other things that you can do to help prevent falls. This information is not intended to replace advice given to you by your health care provider. Make sure you discuss any questions you have with your health care provider. Document Released: 06/06/2009 Document Revised: 01/16/2016 Document Reviewed: 09/14/2014 Elsevier Interactive Patient Education  2017 [employer].

## 2017-09-01 NOTE — Progress Notes (Signed)
Subjective:   Kenneth Hester is a 67 y.o. male who presents for Medicare Annual/Subsequent preventive examination.  Review of Systems:  N/A  Cardiac Risk Factors include: advanced age (>52men, >72 women);dyslipidemia;hypertension;male gender;obesity (BMI >30kg/m2)     Objective:    Vitals: BP 138/80 (BP Location: Left Arm)   Pulse 68   Temp 97.9 F (36.6 C) (Oral)   Ht 5\' 8"  (1.727 m)   Wt 254 lb 3.2 oz (115.3 kg)   BMI 38.65 kg/m   Body mass index is 38.65 kg/m.  Advanced Directives 09/01/2017 08/27/2016 08/27/2016 08/27/2016 12/16/2015 10/07/2015 09/27/2015  Does Patient Have a Medical Advance Directive? No No No No No No No  Would patient like information on creating a medical advance directive? No - Patient declined - - Yes (MAU/Ambulatory/Procedural Areas - Information given) - No - patient declined information No - patient declined information    Tobacco Social History   Tobacco Use  Smoking Status Former Smoker  . Types: Cigarettes  Smokeless Tobacco Never Used  Tobacco Comment   > 40 years ago     Counseling given: Not Answered Comment: > 40 years ago   Clinical Intake:     Pain : No/denies pain Pain Score: 0-No pain     Nutritional Status: BMI > 30  Obese Nutritional Risks: None Diabetes: No  How often do you need to have someone help you when you read instructions, pamphlets, or other written materials from your doctor or pharmacy?: 1 - Never  Interpreter Needed?: No  Information entered by :: [facility], LPN  Past Medical History:  Diagnosis Date  . Arthritis   . Asthma   . Family history of adverse reaction to anesthesia    Patients mother went into cardiac arrest twice during surgery 20 years ago  . GERD (gastroesophageal reflux disease)   . Hypertension   . Pneumonia    hx of  . Primary localized osteoarthritis of left knee 08/26/2016  . Primary localized osteoarthritis of right knee   . Sleep apnea    wears CPAP nightly  . Urgency of  urination    Past Surgical History:  Procedure Laterality Date  . APPENDECTOMY    . COLONOSCOPY  2007  . COLONOSCOPY W/ POLYPECTOMY    . COLONOSCOPY WITH PROPOFOL N/A 07/01/2016   Procedure: COLONOSCOPY WITH PROPOFOL;  Surgeon: Robert Bellow, MD;  Location: [facility] ENDOSCOPY;  Service: Endoscopy;  Laterality: N/A;  . HERNIA REPAIR     67 yrs old and 69 yrs old  . hydrocelectomy    . KNEE SURGERY Right   . SKIN GRAFT    . SKIN SPLIT GRAFT    . 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 TOTAL KNEE ARTHROPLASTY Left 09/07/2016   Procedure: TOTAL KNEE ARTHROPLASTY;  Surgeon: Elsie Saas, MD;  Location: [facility];  Service: Orthopedics;  Laterality: Left;   Family History  Problem Relation Age of Onset  . Heart attack Mother   . Heart failure Mother   . Diabetes Mother   . Hypertension Mother   . Heart disease Mother   . Heart attack Sister   . Heart disease Sister   . Diabetes Sister   . Heart disease Father   . Cancer Father   . Diabetes Son   . Heart attack Maternal Grandfather   . Heart disease Son    Social  History   Socioeconomic History  . Marital status: Married    Spouse name: Pamala Hurry  . Number of children: 2  . Years of education: None  . Highest education level: 12th grade  Social Needs  . Financial resource strain: Not very hard  . Food insecurity - worry: Never true  . Food insecurity - inability: Never true  . Transportation needs - medical: No  . Transportation needs - non-medical: No  Occupational History  . Occupation: realtor  Tobacco Use  . Smoking status: Former Smoker    Types: Cigarettes  . Smokeless tobacco: Never Used  . Tobacco comment: > 40 years ago  Substance and Sexual Activity  . Alcohol use: No  . Drug use: No  . Sexual activity: Yes  Other Topics Concern  . None  Social History Narrative   Pt has trouble  sleeping due to sleep apnea. Pt f/u on this with Dr Alva Garnet.    Outpatient Encounter Medications as of 09/01/2017  Medication Sig  . albuterol (PROAIR HFA) 108 (90 Base) MCG/ACT inhaler INHALE 2 SPRAYS EVERY 4 HOURS AS NEEDED  . albuterol (PROVENTIL) (2.5 MG/3ML) 0.083% nebulizer solution Take 3 mLs (2.5 mg total) by nebulization every 4 (four) hours as needed for wheezing or shortness of breath.  Cristy Friedlander HFA 110 MCG/ACT inhaler INHALE 2 PUFFS INTO THE LUNGS TWICE A DAY  . hydrochlorothiazide (HYDRODIURIL) 25 MG tablet TAKE 1 TABLET (25 MG TOTAL) BY MOUTH DAILY.  . Multiple Vitamins-Minerals (MULTIVITAMIN WITH MINERALS) tablet Take 1 tablet by mouth daily.    No facility-administered encounter medications on file as of 09/01/2017.     Activities of Daily Living In your present state of health, do you have any difficulty performing the following activities: 09/01/2017  Hearing? Y  Comment Pt has noticed hearing loss, but not bad.  Vision? N  Difficulty concentrating or making decisions? Y  Walking or climbing stairs? N  Dressing or bathing? N  Doing errands, shopping? N  Preparing Food and eating ? N  Using the Toilet? N  In the past six months, have you accidently leaked urine? Y  Comment occasionally, does not have to wear protection  Do you have problems with loss of bowel control? N  Managing your Medications? N  Managing your Finances? N  Housekeeping or managing your Housekeeping? N  Some recent data might be hidden    Patient Care Team: Jerrol Banana., MD as PCP - General (Family Medicine) Byrnett, [name], MD (General Surgery) Elsie Saas, MD as Consulting Physician (Orthopedic Surgery) Lorelee Cover., MD as Consulting Physician (Ophthalmology) Wilhelmina Mcardle, MD as Consulting Physician (Pulmonary Disease)   Assessment:   This is a routine wellness examination for Kenneth Hester.  Exercise Activities and Dietary recommendations Current Exercise Habits: The  patient does not participate in regular exercise at present(Pt may go once a month currently but plans to change this. ), Exercise limited by: orthopedic condition(s)  Goals    . Exercise 150 minutes a week     Recommend starting back exercising for at least 150 minutes a week.        Fall Risk Fall Risk  09/01/2017 08/27/2016 09/04/2015  Falls in the past year? Yes No No  Number falls in past yr: 1 - -  Comment missed a step - -  Injury with Fall? No - -  Follow up Falls prevention discussed - -   Is the patient's home free of loose throw rugs in walkways,  pet beds, electrical cords, etc?   yes      Grab bars in the bathroom? yes      Handrails on the stairs?   yes      Adequate lighting?   yes  Timed Get Up and Go Performed: N/A  Depression Screen PHQ 2/9 Scores 09/01/2017 09/01/2017 08/27/2016 09/04/2015  PHQ - 2 Score 0 0 0 0  PHQ- 9 Score 6 - - -    Cognitive Function: Pt declined screening today.     6CIT Screen 08/27/2016  What Year? 0 points  What month? 0 points  What time? 0 points  Count back from 20 0 points  Months in reverse 0 points  Repeat phrase 4 points  Total Score 4    Immunization History  Administered Date(s) Administered  . Pneumococcal Conjugate-13 07/27/2014  . Tdap 07/27/2014    Qualifies for Shingles Vaccine? Due for Shingles vaccine. Declined my offer to administer today. Education has been provided regarding the importance of this vaccine. Pt has been advised to call her insurance company to determine her out of pocket expense. Advised she may also receive this vaccine at her local pharmacy or Health Dept. Verbalized acceptance and understanding.  Screening Tests Health Maintenance  Topic Date Due  . PNA vac Low Risk Adult (2 of 2 - PPSV23) 07/28/2015  . INFLUENZA VACCINE  01/21/2018 (Originally 03/24/2017)  . TETANUS/TDAP  07/27/2024  . COLONOSCOPY  07/01/2026  . Hepatitis C Screening  Completed   Cancer Screenings: Lung: Low Dose CT Chest  recommended if Age 74-80 years, 30 pack-year currently smoking OR have quit w/in 15years. Patient does not qualify. Colorectal: Up to date  Additional Screenings:  Hepatitis B/HIV/Syphillis: Pt declines today.  Hepatitis C Screening: Up to date    Plan:    I have personally reviewed and addressed the Medicare Annual Wellness questionnaire and have noted the following in the patient's chart:  A. Medical and social history B. Use of alcohol, tobacco or illicit drugs  C. Current medications and supplements D. Functional ability and status E.  Nutritional status F.  Physical activity G. Advance directives H. List of other physicians I.  Hospitalizations, surgeries, and ER visits in previous 12 months J.  [place] such as hearing and vision if needed, cognitive and depression L. Referrals and appointments - none  In addition, I have reviewed and discussed with patient certain preventive protocols, quality metrics, and best practice recommendations. A written personalized care plan for preventive services as well as general preventive health recommendations were provided to patient.  See attached scanned questionnaire for additional information.   Signed,  Fabio Neighbors, LPN Nurse Health Advisor   Nurse Recommendations: Pt declined the Pneumovax 23 and influenza vaccines today.

## 2017-09-14 DIAGNOSIS — G4733 Obstructive sleep apnea (adult) (pediatric): Secondary | ICD-10-CM | POA: Diagnosis not present

## 2017-09-20 ENCOUNTER — Other Ambulatory Visit: Payer: Self-pay

## 2017-09-20 MED ORDER — FLUTICASONE PROPIONATE HFA 110 MCG/ACT IN AERO: INHALATION_SPRAY | RESPIRATORY_TRACT | 3 refills | Status: DC

## 2017-10-07 ENCOUNTER — Other Ambulatory Visit: Payer: Self-pay | Admitting: Family Medicine

## 2017-10-13 ENCOUNTER — Encounter: Payer: Self-pay | Admitting: Family Medicine

## 2017-10-13 ENCOUNTER — Ambulatory Visit (INDEPENDENT_AMBULATORY_CARE_PROVIDER_SITE_OTHER): Payer: PPO | Admitting: Family Medicine

## 2017-10-13 VITALS — BP 132/72 | HR 60 | Temp 97.7°F | Resp 16 | Wt 256.0 lb

## 2017-10-13 DIAGNOSIS — Z1211 Encounter for screening for malignant neoplasm of colon: Secondary | ICD-10-CM

## 2017-10-13 DIAGNOSIS — R739 Hyperglycemia, unspecified: Secondary | ICD-10-CM | POA: Diagnosis not present

## 2017-10-13 DIAGNOSIS — K219 Gastro-esophageal reflux disease without esophagitis: Secondary | ICD-10-CM | POA: Diagnosis not present

## 2017-10-13 DIAGNOSIS — Z0000 Encounter for general adult medical examination without abnormal findings: Secondary | ICD-10-CM | POA: Diagnosis not present

## 2017-10-13 DIAGNOSIS — N400 Enlarged prostate without lower urinary tract symptoms: Secondary | ICD-10-CM

## 2017-10-13 DIAGNOSIS — I10 Essential (primary) hypertension: Secondary | ICD-10-CM | POA: Diagnosis not present

## 2017-10-13 DIAGNOSIS — E785 Hyperlipidemia, unspecified: Secondary | ICD-10-CM | POA: Diagnosis not present

## 2017-10-13 DIAGNOSIS — J4521 Mild intermittent asthma with (acute) exacerbation: Secondary | ICD-10-CM | POA: Diagnosis not present

## 2017-10-13 LAB — IFOBT (OCCULT BLOOD): IFOBT: NEGATIVE

## 2017-10-13 NOTE — Progress Notes (Signed)
Patient: Kenneth Hester Male    DOB: 04-Jun-1951   67 y.o.   MRN: [id number] Visit Date: 10/13/2017  Today's Provider: Wilhemena Durie, MD   Chief Complaint  Patient presents with  . Annual Exam  . Hypertension   Subjective:    HPI  Pt saw McKenzie foe AWE on 09/01/17. He declines pneumovax today.  Colonoscopy- 07/01/16 [facility] Tubular Adenoma   Hypertension, follow-up:  BP Readings from Last 3 Encounters:  10/13/17 132/72  09/01/17 138/80  03/01/17 (!) 162/84    He was last seen for hypertension 1 month ago.  BP at that visit was 138/80. Management since that visit includes none. He reports good compliance with treatment. He is not having side effects.  He is not exercising. He is adherent to low salt diet.   Outside blood pressures are not being checked, unless he feels bad. Patient denies chest pain, chest pressure/discomfort, claudication, dyspnea, exertional chest pressure/discomfort, fatigue, irregular heart beat, lower extremity edema, near-syncope, orthopnea, palpitations, paroxysmal nocturnal dyspnea, syncope and tachypnea.   Wt Readings from Last 3 Encounters:  10/13/17 256 lb (116.1 kg)  09/01/17 254 lb 3.2 oz (115.3 kg)  03/01/17 257 lb (116.6 kg)   ------------------------------------------------------------------------       Allergies  Allergen Reactions  . Lisinopril Cough    cough, voice was affected and had hard time singing  . Achromycin [Tetracycline] Rash     Current Outpatient Medications:  .  albuterol (PROAIR HFA) 108 (90 Base) MCG/ACT inhaler, INHALE 2 SPRAYS EVERY 4 HOURS AS NEEDED, Disp: 3 Inhaler, Rfl: 11 .  albuterol (PROVENTIL) (2.5 MG/3ML) 0.083% nebulizer solution, Take 3 mLs (2.5 mg total) by nebulization every 4 (four) hours as needed for wheezing or shortness of breath., Disp: 75 mL, Rfl: 0 .  fluticasone (FLOVENT HFA) 110 MCG/ACT inhaler, INHALE 2 PUFFS INTO THE LUNGS TWICE A DAY, Disp: 3 Inhaler, Rfl: 3 .   hydrochlorothiazide (HYDRODIURIL) 25 MG tablet, TAKE 1 TABLET (25 MG TOTAL) BY MOUTH DAILY., Disp: 90 tablet, Rfl: 3 .  Multiple Vitamins-Minerals (MULTIVITAMIN WITH MINERALS) tablet, Take 1 tablet by mouth daily. , Disp: , Rfl:   Review of Systems  Constitutional: Negative.   HENT: Positive for sore throat.   Eyes: Negative.   Respiratory: Positive for apnea.   Cardiovascular: Negative.   Gastrointestinal: Negative.   Endocrine: Negative.   Genitourinary: Positive for urgency.  Musculoskeletal: Negative.   Skin: Negative.   Allergic/Immunologic: Negative.   Neurological: Negative.   Hematological: Negative.   Psychiatric/Behavioral: Negative.     Social History   Tobacco Use  . Smoking status: Former Smoker    Types: Cigarettes  . Smokeless tobacco: Never Used  . Tobacco comment: > 40 years ago  Substance Use Topics  . Alcohol use: No   Objective:   BP 132/72 (BP Location: Left Arm, Patient Position: Sitting, Cuff Size: Large)   Pulse 60   Temp 97.7 F (36.5 C) (Oral)   Resp 16   Wt 256 lb (116.1 kg)   BMI 38.92 kg/m  Vitals:   10/13/17 0904  BP: 132/72  Pulse: 60  Resp: 16  Temp: 97.7 F (36.5 C)  TempSrc: Oral  Weight: 256 lb (116.1 kg)     Physical Exam  Constitutional: He is oriented to person, place, and time. He appears well-developed and well-nourished.  HENT:  Head: Normocephalic and atraumatic.  Right Ear: External ear normal.  Left Ear: External ear normal.  Nose: Nose  normal.  Mouth/Throat: Oropharynx is clear and moist.  Eyes: Conjunctivae are normal. No scleral icterus.  Neck: No thyromegaly present.  Cardiovascular: Normal rate, regular rhythm, normal heart sounds and intact distal pulses.  Pulmonary/Chest: Effort normal and breath sounds normal.  Abdominal: Soft. Bowel sounds are normal.  Genitourinary: Rectum normal, prostate normal and penis normal.  Musculoskeletal: He exhibits no edema.  Neurological: He is alert and oriented to  person, place, and time.  Skin: Skin is warm and dry.  Psychiatric: He has a normal mood and affect. His behavior is normal. Judgment and thought content normal.        Assessment & Plan:     1. Annual physical exam   2. Essential hypertension  - CBC with Differential/Platelet - TSH  3. Mild intermittent asthma with acute exacerbation Per Dr Alva Garnet  4. Gastroesophageal reflux disease, esophagitis presence not specified   5. Benign prostatic hyperplasia, unspecified whether lower urinary tract symptoms present  - PSA  6. Hyperglycemia  - Hemoglobin A1c  7. Hyperlipidemia, unspecified hyperlipidemia type FH of CAD with mother,father ,sister with cAD. - Lipid panel - Comprehensive metabolic panel  8. Colon cancer screening  - IFOBT POC (occult bld, rslt in office); Future - IFOBT POC (occult bld, rslt in office)  I have done the exam and reviewed the chart and it is accurate to the best of my knowledge. Development worker, community has been used and  any errors in dictation or transcription are unintentional. Miguel Aschoff M.D. [place], MD  [facility]

## 2017-10-14 LAB — COMPREHENSIVE METABOLIC PANEL
ALBUMIN: 4.5 g/dL (ref 3.6–4.8)
ALT: 26 IU/L (ref 0–44)
AST: 20 IU/L (ref 0–40)
Albumin/Globulin Ratio: 1.9 (ref 1.2–2.2)
Alkaline Phosphatase: 62 IU/L (ref 39–117)
BILIRUBIN TOTAL: 0.4 mg/dL (ref 0.0–1.2)
BUN / CREAT RATIO: 15 (ref 10–24)
BUN: 17 mg/dL (ref 8–27)
CHLORIDE: 99 mmol/L (ref 96–106)
CO2: 25 mmol/L (ref 20–29)
CREATININE: 1.17 mg/dL (ref 0.76–1.27)
Calcium: 9.6 mg/dL (ref 8.6–10.2)
GFR calc non Af Amer: 64 mL/min/{1.73_m2} (ref >59)
GFR, EST AFRICAN AMERICAN: 74 mL/min/{1.73_m2} (ref >59)
GLOBULIN, TOTAL: 2.4 g/dL (ref 1.5–4.5)
GLUCOSE: 113 mg/dL — AB (ref 65–99)
Potassium: 4.3 mmol/L (ref 3.5–5.2)
Sodium: 140 mmol/L (ref 134–144)
TOTAL PROTEIN: 6.9 g/dL (ref 6.0–8.5)

## 2017-10-14 LAB — CBC WITH DIFFERENTIAL/PLATELET
BASOS ABS: 0 10*3/uL (ref 0.0–0.2)
Basos: 1 %
EOS (ABSOLUTE): 0.2 10*3/uL (ref 0.0–0.4)
Eos: 4 %
HEMOGLOBIN: 16.7 g/dL (ref 13.0–17.7)
Hematocrit: 47.8 % (ref 37.5–51.0)
IMMATURE GRANS (ABS): 0 10*3/uL (ref 0.0–0.1)
IMMATURE GRANULOCYTES: 0 %
LYMPHS: 35 %
Lymphocytes Absolute: 2.4 10*3/uL (ref 0.7–3.1)
MCH: 33.1 pg — ABNORMAL HIGH (ref 26.6–33.0)
MCHC: 34.9 g/dL (ref 31.5–35.7)
MCV: 95 fL (ref 79–97)
MONOCYTES: 8 %
Monocytes Absolute: 0.5 10*3/uL (ref 0.1–0.9)
NEUTROS PCT: 52 %
Neutrophils Absolute: 3.6 10*3/uL (ref 1.4–7.0)
Platelets: 176 10*3/uL (ref 150–379)
RBC: 5.04 x10E6/uL (ref 4.14–5.80)
RDW: 13.7 % (ref 12.3–15.4)
WBC: 6.8 10*3/uL (ref 3.4–10.8)

## 2017-10-14 LAB — LIPID PANEL
CHOLESTEROL TOTAL: 260 mg/dL — AB (ref 100–199)
Chol/HDL Ratio: 6.3 ratio — ABNORMAL HIGH (ref 0.0–5.0)
HDL: 41 mg/dL (ref >39)
LDL CALC: 159 mg/dL — AB (ref 0–99)
TRIGLYCERIDES: 298 mg/dL — AB (ref 0–149)
VLDL CHOLESTEROL CAL: 60 mg/dL — AB (ref 5–40)

## 2017-10-14 LAB — HEMOGLOBIN A1C
Est. average glucose Bld gHb Est-mCnc: 126 mg/dL
Hgb A1c MFr Bld: 6 % — ABNORMAL HIGH (ref 4.8–5.6)

## 2017-10-14 LAB — TSH: TSH: 2.4 u[IU]/mL (ref 0.450–4.500)

## 2017-10-14 LAB — PSA: Prostate Specific Ag, Serum: 0.9 ng/mL (ref 0.0–4.0)

## 2017-10-15 DIAGNOSIS — G4733 Obstructive sleep apnea (adult) (pediatric): Secondary | ICD-10-CM | POA: Diagnosis not present

## 2017-10-19 ENCOUNTER — Ambulatory Visit (INDEPENDENT_AMBULATORY_CARE_PROVIDER_SITE_OTHER): Payer: PPO | Admitting: Pulmonary Disease

## 2017-10-19 ENCOUNTER — Encounter: Payer: Self-pay | Admitting: Pulmonary Disease

## 2017-10-19 VITALS — BP 138/80 | HR 74 | Ht 68.0 in | Wt 256.0 lb

## 2017-10-19 DIAGNOSIS — E668 Other obesity: Secondary | ICD-10-CM | POA: Diagnosis not present

## 2017-10-19 DIAGNOSIS — G4733 Obstructive sleep apnea (adult) (pediatric): Secondary | ICD-10-CM | POA: Diagnosis not present

## 2017-10-19 DIAGNOSIS — J4530 Mild persistent asthma, uncomplicated: Secondary | ICD-10-CM | POA: Diagnosis not present

## 2017-10-19 NOTE — Progress Notes (Signed)
PULMONARY OFFICE FOLLOW UP NOTE  Requesting MD/Service: Miguel Aschoff, M.D. Date of initial consultation: 11/24/16 Problems: Asthma, OSA  PT PROFILE: 67 y.o. male former smoker (remote, minimal) with a history of mild persistent asthma initially diagnosed in his 53s and obstructive sleep apnea  DATA:  12/22/16 Split night PSG: AHI 42/hr. SpO2 nadir 66%. CPAP 17 cm H2O recommended 06/05-07/04/18 Compliance Report: 30/30 days. > 4 hrs 29/30 days 01/27-02/25/19 Compliance Report: 29/30 days. > 4 hrs 27/30 days. Median pressure 12.4. Range 12-16. Avg AHI 0.8  SUBJ:  He continues to wear CPAP compliantly.  He has problems with mask leak.  Otherwise, he feels that he is benefited by CPAP.  His asthma is well controlled.  He remains on Flovent.  He rarely uses albuterol rescue inhaler.   Vitals:   10/19/17 0858 10/19/17 0901  BP:  138/80  Pulse:  74  SpO2:  97%  Weight: 116.1 kg (256 lb)   Height: 5\' 8"  (1.727 m)   Room air   EXAM:  Gen: NAD HEENT: WNL Neck: No JVD Lungs: There to auscultation and percussion Cardiovascular: Reg, no M Abdomen: Moderately obese, soft, nontender, normal BS Ext: without clubbing, cyanosis, edema Neuro: grossly intact on limited exam  DATA:   BMP Latest Ref Rng & Units 10/13/2017 09/08/2016 08/27/2016  Glucose 65 - 99 mg/dL 113(H) 167(H) 102(H)  BUN 8 - 27 mg/dL 17 17 19   Creatinine 0.76 - 1.27 mg/dL 1.17 1.21 1.25(H)  BUN/Creat Ratio 10 - 24 15 - -  Sodium 134 - 144 mmol/L 140 136 141  Potassium 3.5 - 5.2 mmol/L 4.3 4.2 3.8  Chloride 96 - 106 mmol/L 99 101 106  CO2 20 - 29 mmol/L 25 25 27   Calcium 8.6 - 10.2 mg/dL 9.6 8.9 9.2    CBC Latest Ref Rng & Units 10/13/2017 09/08/2016 08/27/2016  WBC 3.4 - 10.8 x10E3/uL 6.8 14.9(H) 8.1  Hemoglobin 13.0 - 17.7 g/dL 16.7 14.3 15.8  Hematocrit 37.5 - 51.0 % 47.8 41.4 46.1  Platelets 150 - 379 x10E3/uL 176 162 163    CXR: NNF  IMPRESSION:     ICD-10-CM   1. OSA (obstructive sleep apnea) G47.33  Desensitization mask fit  2. Moderate obesity E66.8   3. Mild persistent asthma without complication W29.56      PLAN:  Continue CPAP with current settings Continue to work on weight loss as discussed Continue Flovent and albuterol as needed. Follow-up in 6 months or sooner as needed  Merton Border, MD PCCM service Mobile [phone] Pager [phone] 10/19/2017 9:16 AM

## 2017-10-19 NOTE — Patient Instructions (Signed)
Continue CPAP at current settings Continue to work on weight loss as we discussed We will refer to mask fit clinic in [place] Follow-up in 6 months or sooner as needed

## 2017-10-27 ENCOUNTER — Ambulatory Visit (HOSPITAL_BASED_OUTPATIENT_CLINIC_OR_DEPARTMENT_OTHER): Payer: PPO | Attending: Pulmonary Disease | Admitting: Radiology

## 2017-10-27 DIAGNOSIS — G4733 Obstructive sleep apnea (adult) (pediatric): Secondary | ICD-10-CM

## 2017-11-12 DIAGNOSIS — G4733 Obstructive sleep apnea (adult) (pediatric): Secondary | ICD-10-CM | POA: Diagnosis not present

## 2017-11-25 DIAGNOSIS — H25813 Combined forms of age-related cataract, bilateral: Secondary | ICD-10-CM | POA: Diagnosis not present

## 2017-12-13 DIAGNOSIS — G4733 Obstructive sleep apnea (adult) (pediatric): Secondary | ICD-10-CM | POA: Diagnosis not present

## 2017-12-18 DIAGNOSIS — R05 Cough: Secondary | ICD-10-CM | POA: Diagnosis not present

## 2017-12-18 DIAGNOSIS — M10072 Idiopathic gout, left ankle and foot: Secondary | ICD-10-CM | POA: Diagnosis not present

## 2018-01-12 DIAGNOSIS — G4733 Obstructive sleep apnea (adult) (pediatric): Secondary | ICD-10-CM | POA: Diagnosis not present

## 2018-04-12 ENCOUNTER — Encounter: Payer: Self-pay | Admitting: Family Medicine

## 2018-04-12 ENCOUNTER — Ambulatory Visit (INDEPENDENT_AMBULATORY_CARE_PROVIDER_SITE_OTHER): Payer: PPO | Admitting: Family Medicine

## 2018-04-12 VITALS — BP 138/72 | HR 64 | Temp 98.6°F | Resp 16 | Ht 68.0 in | Wt 248.0 lb

## 2018-04-12 DIAGNOSIS — I10 Essential (primary) hypertension: Secondary | ICD-10-CM | POA: Diagnosis not present

## 2018-04-12 NOTE — Progress Notes (Signed)
Kenneth Hester  MRN: [id number] DOB: 10-06-1950  Subjective:  HPI   The patient is a 67 year old male who presents today for 6 month follow up of chronic health.  He was last seen on 10/13/17. He feels well,no complaints.He has lost 3 lbs. Hypertension BP Readings from Last 3 Encounters:  04/12/18 138/72  10/19/17 138/80  10/13/17 132/72    Elevated Glucose-The patient hd A1C done on 10/13/17 and it was 6.0  Patient Active Problem List   Diagnosis Date Noted  . 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 Primary localized osteoarthritis of left knee 08/26/2016  . Umbilical hernia without obstruction and without gangrene 05/26/2016  . Encounter for screening colonoscopy 12/03/2015  . Primary localized osteoarthritis of right knee   . Hypertension   . Chest pain 12/05/2013  . Sleep apnea 12/05/2013  . Morbid obesity ([place]) 12/05/2013  . GERD (gastroesophageal reflux disease) 12/05/2013  . Asthma 12/05/2013    Past Medical History:  Diagnosis Date  . Arthritis   . Asthma   . Family history of adverse reaction to anesthesia    Patients mother went into cardiac arrest twice during surgery 20 years ago  . GERD (gastroesophageal reflux disease)   . Hypertension   . Pneumonia    hx of  . Primary localized osteoarthritis of left knee 08/26/2016  . Primary localized osteoarthritis of right knee   . Sleep apnea    wears CPAP nightly  . Urgency of urination     Social History   Socioeconomic History  . Marital status: Married    Spouse name: Pamala Hurry  . Number of children: 2  . Years of education: Not on file  . Highest education level: 12th grade  Occupational History  . Occupation: Cabin crew  Social Needs  . Financial resource strain: Not very hard  . Food insecurity:    Worry: Never true    Inability: Never true  . Transportation needs:    Medical: No    Non-medical: No  Tobacco Use  . Smoking status: Former Smoker    Types: Cigarettes  .  Smokeless tobacco: Never Used  . Tobacco comment: > 40 years ago  Substance and Sexual Activity  . Alcohol use: No  . Drug use: No  . Sexual activity: Yes  Lifestyle  . Physical activity:    Days per week: Not on file    Minutes per session: Not on file  . Stress: Rather much  Relationships  . Social connections:    Talks on phone: Not on file    Gets together: Not on file    Attends religious service: Not on file    Active member of club or organization: Not on file    Attends meetings of clubs or organizations: Not on file    Relationship status: Not on file  . Intimate partner violence:    Fear of current or ex partner: No    Emotionally abused: No    Physically abused: No    Forced sexual activity: No  Other Topics Concern  . Not on file  Social History Narrative   Pt has trouble sleeping due to sleep apnea. Pt f/u on this with Dr Alva Garnet.    Outpatient Encounter Medications as of 04/12/2018  Medication Sig  . albuterol (PROAIR HFA) 108 (90 Base) MCG/ACT inhaler INHALE 2 SPRAYS EVERY 4 HOURS AS NEEDED  . albuterol (PROVENTIL) (2.5 MG/3ML) 0.083% nebulizer solution Take 3 mLs (2.5 mg total) by  nebulization every 4 (four) hours as needed for wheezing or shortness of breath.  . fluticasone (FLOVENT HFA) 110 MCG/ACT inhaler INHALE 2 PUFFS INTO THE LUNGS TWICE A DAY  . hydrochlorothiazide (HYDRODIURIL) 25 MG tablet TAKE 1 TABLET (25 MG TOTAL) BY MOUTH DAILY.  . Multiple Vitamins-Minerals (MULTIVITAMIN WITH MINERALS) tablet Take 1 tablet by mouth daily.    No facility-administered encounter medications on file as of 04/12/2018.     Allergies  Allergen Reactions  . Lisinopril Cough    cough, voice was affected and had hard time singing  . Achromycin [Tetracycline] Rash    Review of Systems  Constitutional: Negative.   Eyes: Negative.   Respiratory: Negative.   Cardiovascular: Positive for leg swelling.        Very mild LE swelling only at end of day.    Gastrointestinal: Negative.   Skin: Negative.   Neurological: Negative.   Endo/Heme/Allergies: Negative.   Psychiatric/Behavioral: Negative.     Objective:  BP 138/72 (BP Location: Right Arm, Patient Position: Sitting, Cuff Size: Large)   Pulse 64   Temp 98.6 F (37 C)   Resp 16   Ht 5\' 8"  (1.727 m)   Wt 248 lb (112.5 kg)   SpO2 95%   BMI 37.71 kg/m   Physical Exam  Constitutional: He is oriented to person, place, and time and well-developed, well-nourished, and in no distress.  HENT:  Head: Normocephalic and atraumatic.  Eyes: Conjunctivae are normal. No scleral icterus.  Cardiovascular: Normal rate, regular rhythm and normal heart sounds.  Pulmonary/Chest: Effort normal and breath sounds normal.  Abdominal: Soft.  Musculoskeletal: He exhibits no edema.  Very mild trace edema.  Neurological: He is alert and oriented to person, place, and time. Gait normal. GCS score is 15.  Skin: Skin is warm and dry.  Psychiatric: Mood, memory, affect and judgment normal.    Assessment and Plan :  Essential hypertension Obesity/Prediabetes CPE 2020.  I have done the exam and reviewed the chart and it is accurate to the best of my knowledge. Development worker, community has been used and  any errors in dictation or transcription are unintentional. Miguel Aschoff M.D. [facility]

## 2018-04-27 DIAGNOSIS — D1801 Hemangioma of skin and subcutaneous tissue: Secondary | ICD-10-CM | POA: Diagnosis not present

## 2018-04-27 DIAGNOSIS — L821 Other seborrheic keratosis: Secondary | ICD-10-CM | POA: Diagnosis not present

## 2018-04-27 DIAGNOSIS — D2272 Melanocytic nevi of left lower limb, including hip: Secondary | ICD-10-CM | POA: Diagnosis not present

## 2018-04-27 DIAGNOSIS — L570 Actinic keratosis: Secondary | ICD-10-CM | POA: Diagnosis not present

## 2018-04-27 DIAGNOSIS — D2262 Melanocytic nevi of left upper limb, including shoulder: Secondary | ICD-10-CM | POA: Diagnosis not present

## 2018-04-27 DIAGNOSIS — D225 Melanocytic nevi of trunk: Secondary | ICD-10-CM | POA: Diagnosis not present

## 2018-05-03 ENCOUNTER — Encounter: Payer: Self-pay | Admitting: Pulmonary Disease

## 2018-05-03 ENCOUNTER — Ambulatory Visit (INDEPENDENT_AMBULATORY_CARE_PROVIDER_SITE_OTHER): Payer: PPO | Admitting: Pulmonary Disease

## 2018-05-03 VITALS — BP 140/80 | HR 57 | Ht 68.0 in | Wt 241.0 lb

## 2018-05-03 DIAGNOSIS — G4733 Obstructive sleep apnea (adult) (pediatric): Secondary | ICD-10-CM

## 2018-05-03 DIAGNOSIS — J4530 Mild persistent asthma, uncomplicated: Secondary | ICD-10-CM

## 2018-05-03 NOTE — Progress Notes (Signed)
PULMONARY OFFICE FOLLOW UP NOTE  Requesting MD/Service: Miguel Aschoff, M.D. Date of initial consultation: 11/24/16 Problems: Asthma, OSA  PT PROFILE: 67 y.o. male former smoker (remote, minimal) with a history of mild persistent asthma initially diagnosed in his 62s and obstructive sleep apnea  DATA:  12/22/16 Split night PSG: AHI 42/hr. SpO2 nadir 66%. CPAP 17 cm H2O recommended 06/05-07/04/18 Compliance Report: 30/30 days. > 4 hrs 29/30 days 01/27-02/25/19 Compliance Report: 29/30 days. > 4 hrs 27/30 days. Median pressure 12.4. Range 12-16. Avg AHI 0.8  INTERVAL: Last seen 10/19/2017.  No major events  SUBJ:  This is a scheduled follow-up.  He remains compliant with his AutoSet CPAP (current settings 12-18 cm H2O).  However, he states that he "hates it" because he feels that it is too much pressure.  He has made a concerted effort at weight loss and has lost 15 pounds since February.  He is following a low carbohydrate diet.  He remains on Flovent inhaler twice a day.  He cannot remember the last time he used his rescue inhaler.  He has no new complaints.  He denies CP, fever, purulent sputum, hemoptysis, LE edema and calf tenderness.   Vitals:   05/03/18 0959 05/03/18 1005  BP:  140/80  Pulse:  (!) 57  SpO2:  97%  Weight: 241 lb (109.3 kg)   Height: 5\' 8"  (1.727 m)   Room air   EXAM:  Gen: NAD HEENT: NCAT, sclera white Neck: No JVD Lungs: breath sounds full, no wheezes or other adventitious sounds Cardiovascular: RRR, no murmurs Abdomen: Soft, nontender, normal BS Ext: without clubbing, cyanosis, edema Neuro: grossly intact Skin: Limited exam, no lesions noted   DATA:   BMP Latest Ref Rng & Units 10/13/2017 09/08/2016 08/27/2016  Glucose 65 - 99 mg/dL 113(H) 167(H) 102(H)  BUN 8 - 27 mg/dL 17 17 19   Creatinine 0.76 - 1.27 mg/dL 1.17 1.21 1.25(H)  BUN/Creat Ratio 10 - 24 15 - -  Sodium 134 - 144 mmol/L 140 136 141  Potassium 3.5 - 5.2 mmol/L 4.3 4.2 3.8  Chloride 96  - 106 mmol/L 99 101 106  CO2 20 - 29 mmol/L 25 25 27   Calcium 8.6 - 10.2 mg/dL 9.6 8.9 9.2    CBC Latest Ref Rng & Units 10/13/2017 09/08/2016 08/27/2016  WBC 3.4 - 10.8 x10E3/uL 6.8 14.9(H) 8.1  Hemoglobin 13.0 - 17.7 g/dL 16.7 14.3 15.8  Hematocrit 37.5 - 51.0 % 47.8 41.4 46.1  Platelets 150 - 379 x10E3/uL 176 162 163    CXR: NNF  IMPRESSION:     ICD-10-CM   1. OSA (obstructive sleep apnea) G47.33   2. Mild persistent asthma without complication W97.98      PLAN:  Continue AutoSet CPAP.  We will contact [facility] to have the settings changed to 10-15 cm H2O  Recommended that he may change Flovent 110 mcg inhaler to 2 sprays daily.  He is reminded to rinse mouth after use  Continue albuterol inhaler as needed  I congratulated him on his excellent efforts at weight loss and encouraged him to continue these efforts.  Follow-up in 1 year.  Call sooner if needed  Merton Border, MD PCCM service Mobile [phone] Pager [phone] 05/03/2018 10:25 AM

## 2018-05-03 NOTE — Patient Instructions (Signed)
We will call [facility] to change setting on AutoSet CPAP to 10-15 cm H2O  You may change Flovent 110 mcg inhaler to 2 sprays once a day.  Remember to rinse mouth after use  Continue your excellent efforts at weight loss  Continue albuterol inhaler as needed  Follow-up in 1 year.  Call sooner if needed

## 2018-07-04 DIAGNOSIS — M542 Cervicalgia: Secondary | ICD-10-CM | POA: Diagnosis not present

## 2018-07-04 DIAGNOSIS — M25511 Pain in right shoulder: Secondary | ICD-10-CM | POA: Diagnosis not present

## 2018-07-04 DIAGNOSIS — M47812 Spondylosis without myelopathy or radiculopathy, cervical region: Secondary | ICD-10-CM | POA: Diagnosis not present

## 2018-07-04 DIAGNOSIS — M7581 Other shoulder lesions, right shoulder: Secondary | ICD-10-CM | POA: Diagnosis not present

## 2018-07-07 DIAGNOSIS — H25813 Combined forms of age-related cataract, bilateral: Secondary | ICD-10-CM | POA: Diagnosis not present

## 2018-08-17 IMAGING — CR DG CHEST 2V
2 series · 2 of 2 positions shown · non-contrast
Comparison: 10/11/2013

CLINICAL DATA: Flu like symptoms with increasing shortness of
breath

EXAM:
CHEST  2 VIEW

[chest pa]
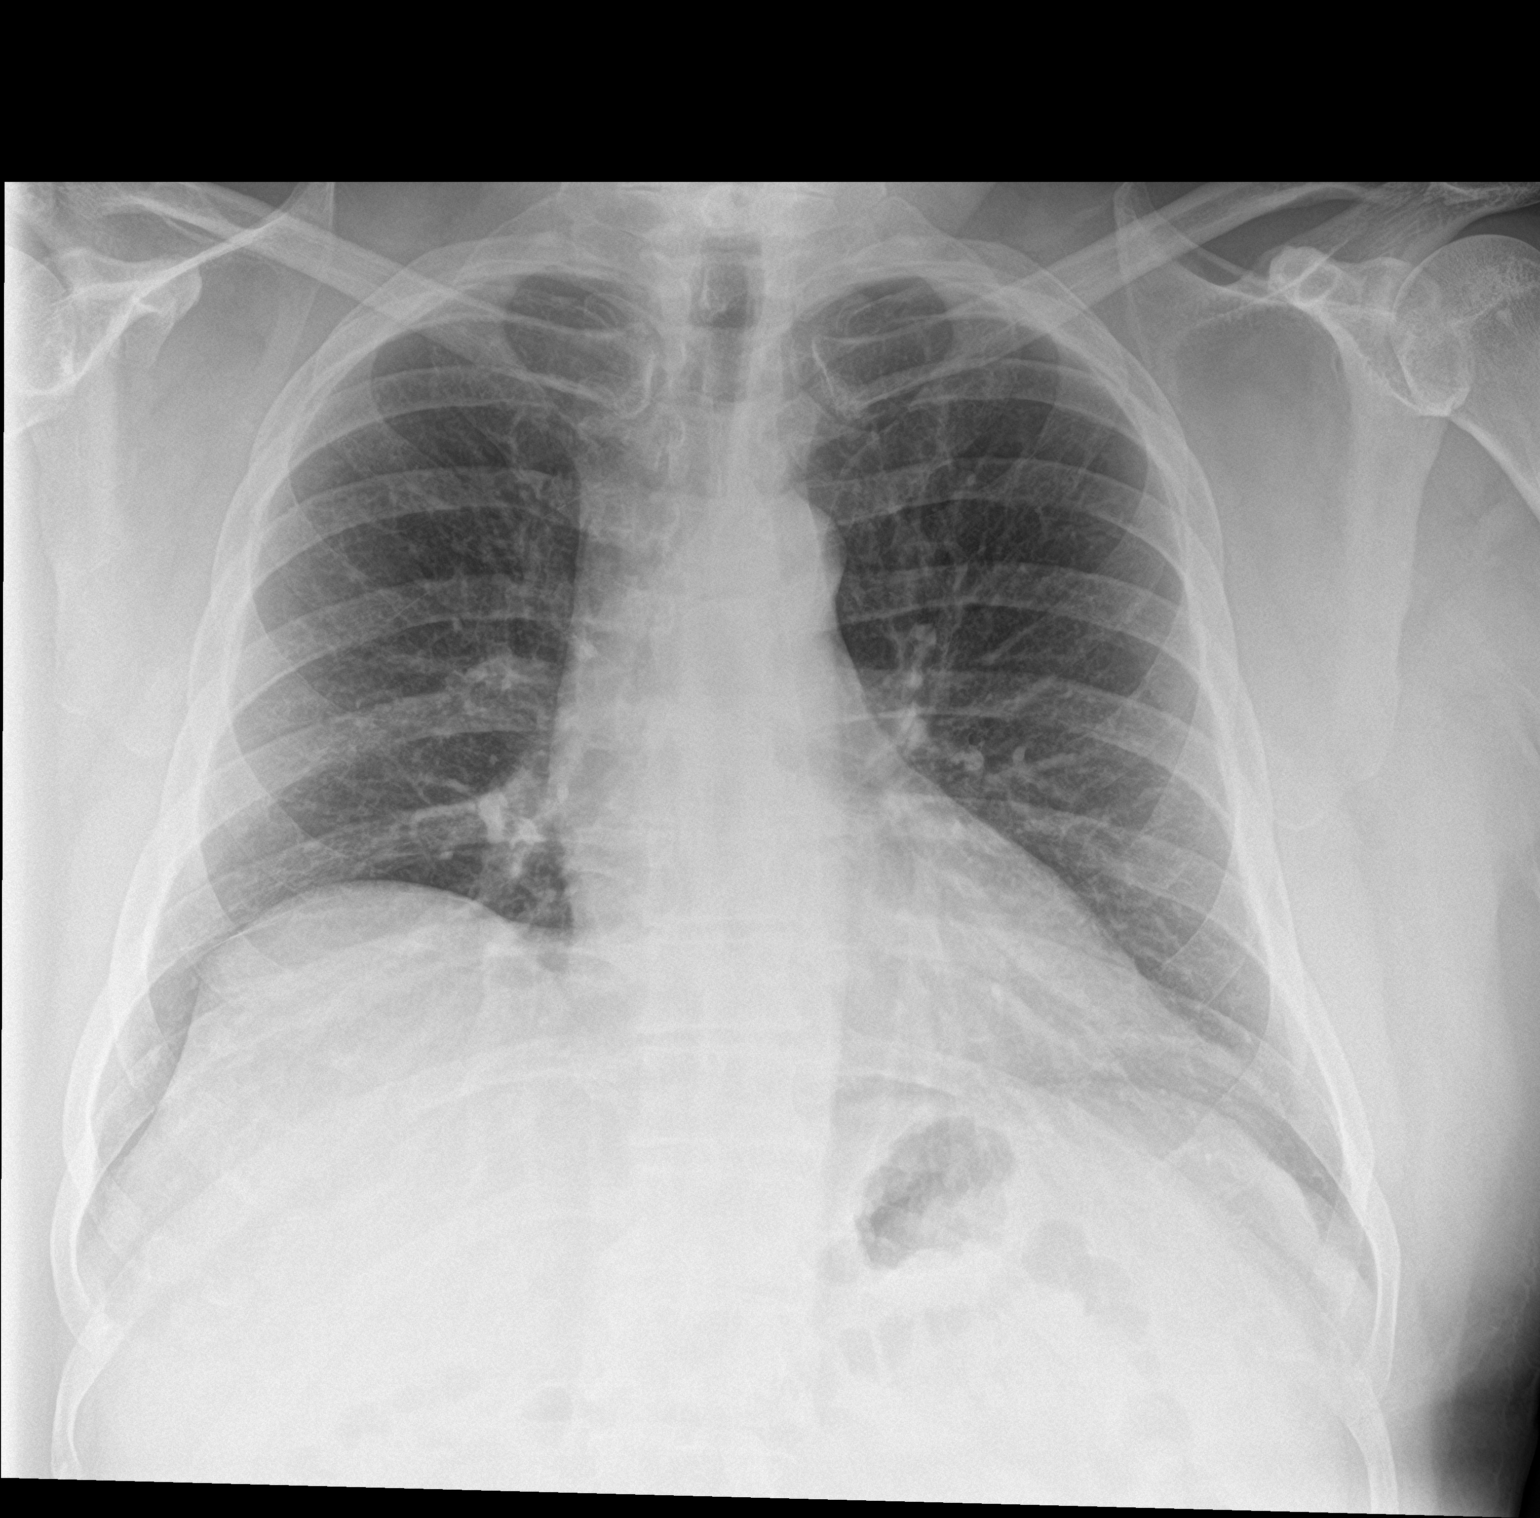

[chest lat]
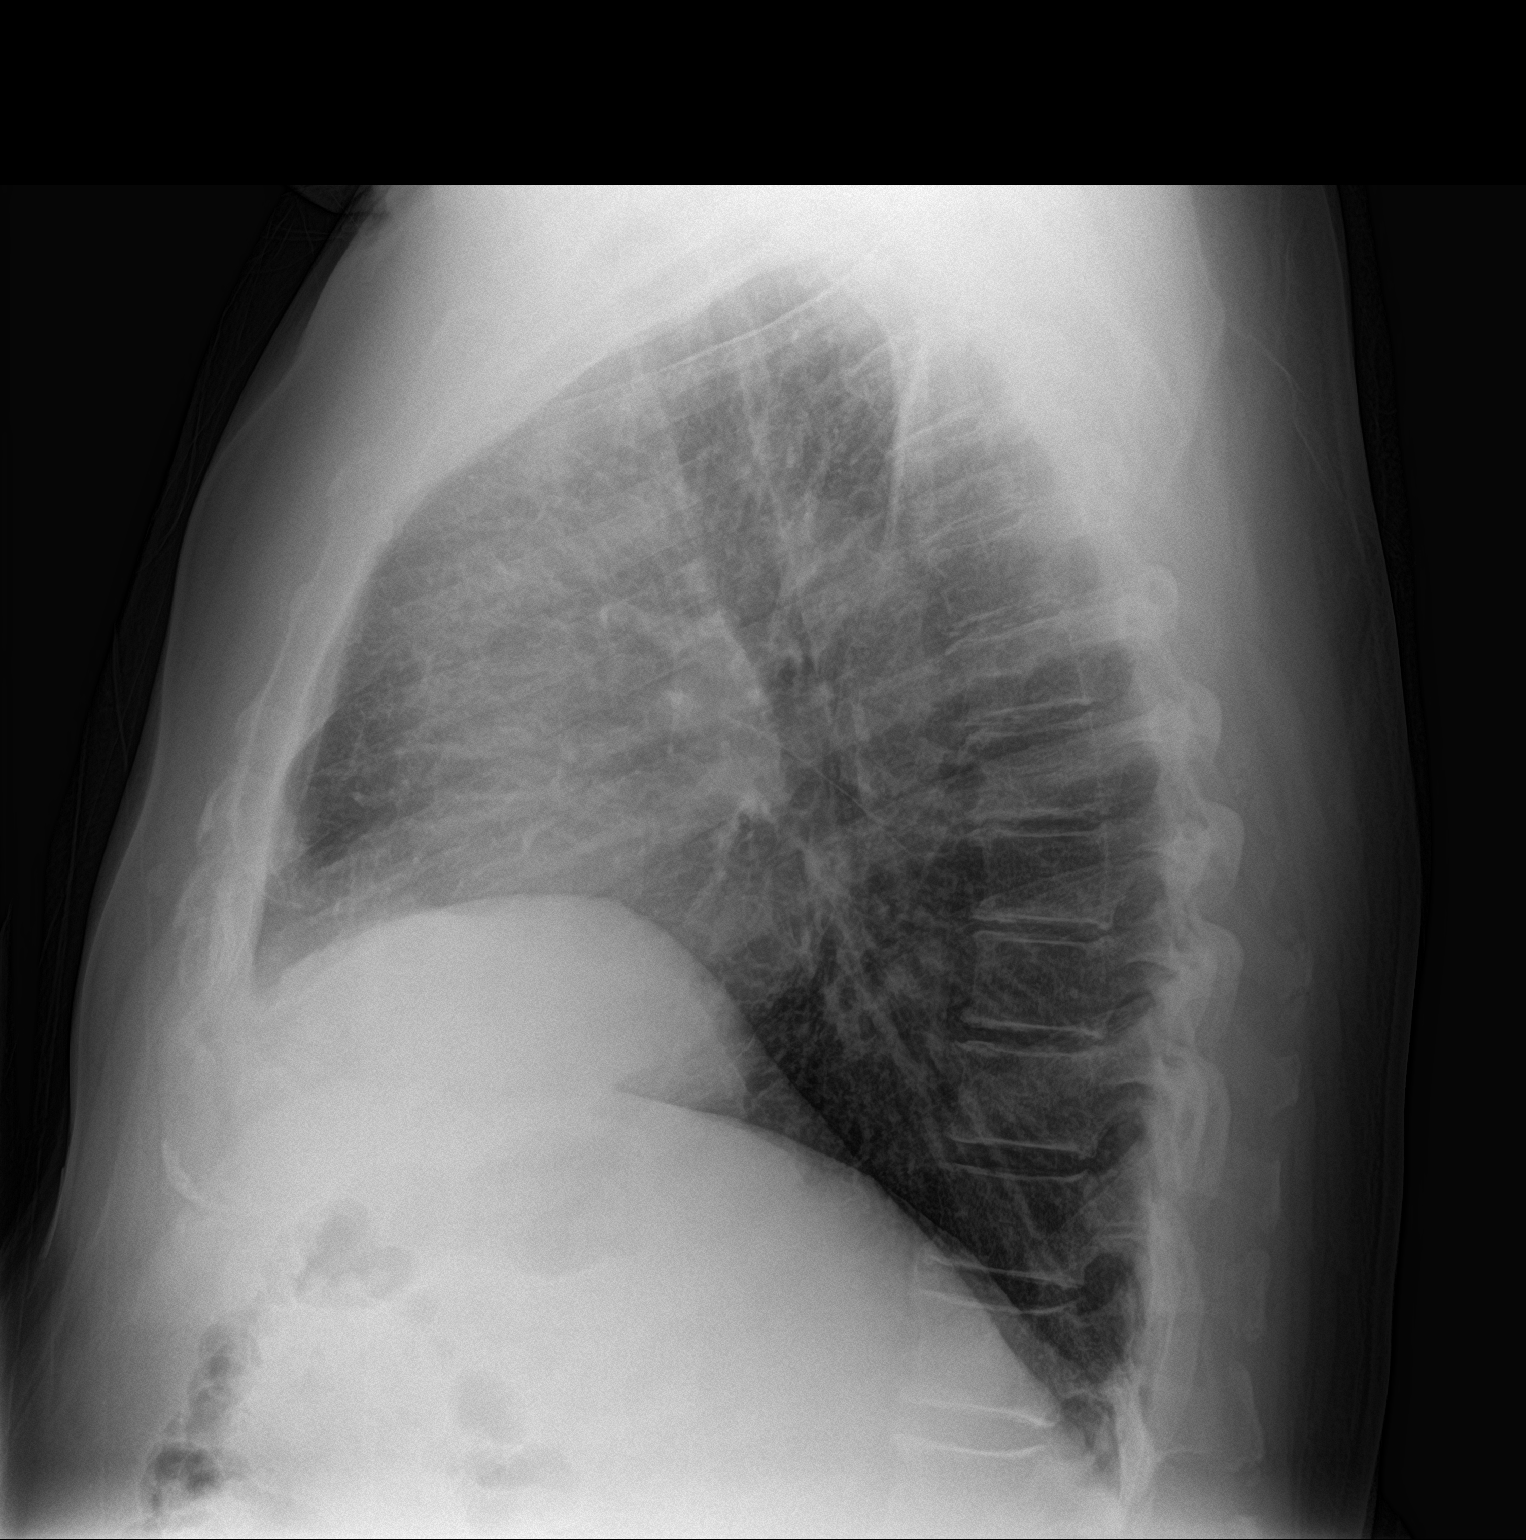

[2 of 2 positions shown; findings below may reference images not displayed]

FINDINGS: Stable elevation of right diaphragm. No acute infiltrate or
effusion. Cardiomediastinal silhouette within normal limits. No
pneumothorax.
IMPRESSION: No active cardiopulmonary disease.

## 2018-08-31 ENCOUNTER — Telehealth: Payer: Self-pay

## 2018-08-31 NOTE — Telephone Encounter (Signed)
LMTCB and schedule AWV. CPE is scheduled for 10/17/17, so AWV needs to be some times before that apt.  -MM

## 2018-09-19 NOTE — Telephone Encounter (Signed)
Scheduled AWV for 10/14/18 @ 10 AM. -MM

## 2018-10-12 ENCOUNTER — Telehealth: Payer: Self-pay | Admitting: Family Medicine

## 2018-10-12 NOTE — Telephone Encounter (Signed)
I called the patient to reschedule his AWV due to the office being closed.  There was no answer, and I was unable to leave a message because the voicemail was full.  I called the work number, and they provided me with his cell phone number which I had already called.  VDM (DD)

## 2018-10-14 ENCOUNTER — Ambulatory Visit (INDEPENDENT_AMBULATORY_CARE_PROVIDER_SITE_OTHER): Payer: PPO

## 2018-10-14 ENCOUNTER — Ambulatory Visit: Payer: PPO

## 2018-10-14 VITALS — BP 142/72 | HR 64 | Temp 98.4°F | Ht 68.0 in | Wt 231.4 lb

## 2018-10-14 DIAGNOSIS — Z0000 Encounter for general adult medical examination without abnormal findings: Secondary | ICD-10-CM | POA: Diagnosis not present

## 2018-10-14 NOTE — Patient Instructions (Signed)
Mr. Kenneth Hester , Thank you for taking time to come for your Medicare Wellness Visit. I appreciate your ongoing commitment to your health goals. Please review the following plan we discussed and let me know if I can assist you in the future.   Screening recommendations/referrals: Colonoscopy: Up to date, due 06/2019 Recommended yearly ophthalmology/optometry visit for glaucoma screening and checkup Recommended yearly dental visit for hygiene and checkup  Vaccinations: Influenza vaccine: Pt declines today.  Pneumococcal vaccine: Pt declined Pneumovax 23 vaccine today.  Tdap vaccine: Up to date, due 07/2024 Shingles vaccine: Pt declines today.     Advanced directives: Advance directive discussed with you today. Even though you declined this today please call our office should you change your mind and we can give you the proper paperwork for you to fill out.  Conditions/risks identified: Obesity- Continue current diet plan and start exercising 3 days a week for at least 30 minutes at a time.   Next appointment: 10/17/18 @ 9:00 AM with [name].   Preventive Care 71 Years and Older, Male Preventive care refers to lifestyle choices and visits with your health care provider that can promote health and wellness. What does preventive care include?  A yearly physical exam. This is also called an annual well check.  Dental exams once or twice a year.  Routine eye exams. Ask your health care provider how often you should have your eyes checked.  Personal lifestyle choices, including:  Daily care of your teeth and gums.  Regular physical activity.  Eating a healthy diet.  Avoiding tobacco and drug use.  Limiting alcohol use.  Practicing safe sex.  Taking low doses of aspirin every day.  Taking vitamin and mineral supplements as recommended by your health care provider. What happens during an annual well check? The services and screenings done by your health care provider during your  annual well check will depend on your age, overall health, lifestyle risk factors, and family history of disease. Counseling  Your health care provider may ask you questions about your:  Alcohol use.  Tobacco use.  Drug use.  Emotional well-being.  Home and relationship well-being.  Sexual activity.  Eating habits.  History of falls.  Memory and ability to understand (cognition).  Work and work Statistician. Screening  You may have the following tests or measurements:  Height, weight, and BMI.  Blood pressure.  Lipid and cholesterol levels. These may be checked every 5 years, or more frequently if you are over 80 years old.  Skin check.  Lung cancer screening. You may have this screening every year starting at age 49 if you have a 30-pack-year history of smoking and currently smoke or have quit within the past 15 years.  Fecal occult blood test (FOBT) of the stool. You may have this test every year starting at age 93.  Flexible sigmoidoscopy or colonoscopy. You may have a sigmoidoscopy every 5 years or a colonoscopy every 10 years starting at age 74.  Prostate cancer screening. Recommendations will vary depending on your family history and other risks.  Hepatitis C blood test.  Hepatitis B blood test.  Sexually transmitted disease (STD) testing.  Diabetes screening. This is done by checking your blood sugar (glucose) after you have not eaten for a while (fasting). You may have this done every 1-3 years.  Abdominal aortic aneurysm (AAA) screening. You may need this if you are a current or former smoker.  Osteoporosis. You may be screened starting at age 48 if you  are at high risk. Talk with your health care provider about your test results, treatment options, and if necessary, the need for more tests. Vaccines  Your health care provider may recommend certain vaccines, such as:  Influenza vaccine. This is recommended every year.  Tetanus, diphtheria, and  acellular pertussis (Tdap, Td) vaccine. You may need a Td booster every 10 years.  Zoster vaccine. You may need this after age 71.  Pneumococcal 13-valent conjugate (PCV13) vaccine. One dose is recommended after age 66.  Pneumococcal polysaccharide (PPSV23) vaccine. One dose is recommended after age 69. Talk to your health care provider about which screenings and vaccines you need and how often you need them. This information is not intended to replace advice given to you by your health care provider. Make sure you discuss any questions you have with your health care provider. Document Released: 09/06/2015 Document Revised: 04/29/2016 Document Reviewed: 06/11/2015 Elsevier Interactive Patient Education  2017 [place] Prevention in the Home Falls can cause injuries. They can happen to people of all ages. There are many things you can do to make your home safe and to help prevent falls. What can I do on the outside of my home?  Regularly fix the edges of walkways and driveways and fix any cracks.  Remove anything that might make you trip as you walk through a door, such as a raised step or threshold.  Trim any bushes or trees on the path to your home.  Use bright outdoor lighting.  Clear any walking paths of anything that might make someone trip, such as rocks or tools.  Regularly check to see if handrails are loose or broken. Make sure that both sides of any steps have handrails.  Any raised decks and porches should have guardrails on the edges.  Have any leaves, snow, or ice cleared regularly.  Use sand or salt on walking paths during winter.  Clean up any spills in your garage right away. This includes oil or grease spills. What can I do in the bathroom?  Use night lights.  Install grab bars by the toilet and in the tub and shower. Do not use towel bars as grab bars.  Use non-skid mats or decals in the tub or shower.  If you need to sit down in the shower, use  a plastic, non-slip stool.  Keep the floor dry. Clean up any water that spills on the floor as soon as it happens.  Remove soap buildup in the tub or shower regularly.  Attach bath mats securely with double-sided non-slip rug tape.  Do not have throw rugs and other things on the floor that can make you trip. What can I do in the bedroom?  Use night lights.  Make sure that you have a light by your bed that is easy to reach.  Do not use any sheets or blankets that are too big for your bed. They should not hang down onto the floor.  Have a firm chair that has side arms. You can use this for support while you get dressed.  Do not have throw rugs and other things on the floor that can make you trip. What can I do in the kitchen?  Clean up any spills right away.  Avoid walking on wet floors.  Keep items that you use a lot in easy-to-reach places.  If you need to reach something above you, use a strong step stool that has a grab bar.  Keep electrical cords out  of the way.  Do not use floor polish or wax that makes floors slippery. If you must use wax, use non-skid floor wax.  Do not have throw rugs and other things on the floor that can make you trip. What can I do with my stairs?  Do not leave any items on the stairs.  Make sure that there are handrails on both sides of the stairs and use them. Fix handrails that are broken or loose. Make sure that handrails are as long as the stairways.  Check any carpeting to make sure that it is firmly attached to the stairs. Fix any carpet that is loose or worn.  Avoid having throw rugs at the top or bottom of the stairs. If you do have throw rugs, attach them to the floor with carpet tape.  Make sure that you have a light switch at the top of the stairs and the bottom of the stairs. If you do not have them, ask someone to add them for you. What else can I do to help prevent falls?  Wear shoes that:  Do not have high heels.  Have  rubber bottoms.  Are comfortable and fit you well.  Are closed at the toe. Do not wear sandals.  If you use a stepladder:  Make sure that it is fully opened. Do not climb a closed stepladder.  Make sure that both sides of the stepladder are locked into place.  Ask someone to hold it for you, if possible.  Clearly mark and make sure that you can see:  Any grab bars or handrails.  First and last steps.  Where the edge of each step is.  Use tools that help you move around (mobility aids) if they are needed. These include:  Canes.  Walkers.  Scooters.  Crutches.  Turn on the lights when you go into a dark area. Replace any light bulbs as soon as they burn out.  Set up your furniture so you have a clear path. Avoid moving your furniture around.  If any of your floors are uneven, fix them.  If there are any pets around you, be aware of where they are.  Review your medicines with your doctor. Some medicines can make you feel dizzy. This can increase your chance of falling. Ask your doctor what other things that you can do to help prevent falls. This information is not intended to replace advice given to you by your health care provider. Make sure you discuss any questions you have with your health care provider. Document Released: 06/06/2009 Document Revised: 01/16/2016 Document Reviewed: 09/14/2014 Elsevier Interactive Patient Education  2017 [employer].

## 2018-10-14 NOTE — Progress Notes (Signed)
Subjective:   Kenneth Hester is a 68 y.o. male who presents for Medicare Annual/Subsequent preventive examination.  Review of Systems:  N/A  Cardiac Risk Factors include: advanced age (>58men, >58 women);hypertension;male gender     Objective:    Vitals: BP (!) 142/72 (BP Location: Right Arm)   Pulse 64   Temp 98.4 F (36.9 C) (Oral)   Ht 5\' 8"  (1.727 m)   Wt 231 lb 6.4 oz (105 kg)   BMI 35.18 kg/m   Body mass index is 35.18 kg/m.  Advanced Directives 10/14/2018 09/01/2017 08/27/2016 08/27/2016 08/27/2016 12/16/2015 10/07/2015  Does Patient Have a Medical Advance Directive? No No No No No No No  Would patient like information on creating a medical advance directive? No - Patient declined No - Patient declined - - Yes (MAU/Ambulatory/Procedural Areas - Information given) - No - patient declined information    Tobacco Social History   Tobacco Use  Smoking Status Former Smoker  . Types: Cigarettes  Smokeless Tobacco Never Used  Tobacco Comment   > 40 years ago     Counseling given: Not Answered Comment: > 40 years ago   Clinical Intake:     Pain : No/denies pain Pain Score: 0-No pain     Nutritional Status: BMI > 30  Obese Nutritional Risks: None Diabetes: No  How often do you need to have someone help you when you read instructions, pamphlets, or other written materials from your doctor or pharmacy?: 1 - Never  Interpreter Needed?: No  Information entered by :: [facility], LPN  Past Medical History:  Diagnosis Date  . Arthritis   . Asthma   . Family history of adverse reaction to anesthesia    Patients mother went into cardiac arrest twice during surgery 20 years ago  . GERD (gastroesophageal reflux disease)   . Hypertension   . Pneumonia    hx of  . Primary localized osteoarthritis of left knee 08/26/2016  . Primary localized osteoarthritis of right knee   . Sleep apnea    wears CPAP nightly  . Urgency of urination    Past Surgical History:    Procedure Laterality Date  . APPENDECTOMY    . COLONOSCOPY  2007  . COLONOSCOPY W/ POLYPECTOMY    . COLONOSCOPY WITH PROPOFOL N/A 07/01/2016   Procedure: COLONOSCOPY WITH PROPOFOL;  Surgeon: Robert Bellow, MD;  Location: [facility] ENDOSCOPY;  Service: Endoscopy;  Laterality: N/A;  . HERNIA REPAIR     68 yrs old and 68 yrs old  . hydrocelectomy    . KNEE SURGERY Right   . SKIN GRAFT    . SKIN SPLIT GRAFT    . 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 TOTAL KNEE ARTHROPLASTY Left 09/07/2016   Procedure: TOTAL KNEE ARTHROPLASTY;  Surgeon: Elsie Saas, MD;  Location: [place];  Service: Orthopedics;  Laterality: Left;   Family History  Problem Relation Age of Onset  . Heart attack Mother   . Heart failure Mother   . Diabetes Mother   . Hypertension Mother   . Heart disease Mother   . Heart attack Sister   . Heart disease Sister   . Diabetes Sister   . Heart disease Father   . Cancer Father   . Diabetes Son   . Heart attack Maternal Grandfather   . Heart disease Son    Social History  Socioeconomic History  . Marital status: Married    Spouse name: Pamala Hurry  . Number of children: 2  . Years of education: Not on file  . Highest education level: 12th grade  Occupational History  . Occupation: Cabin crew  Social Needs  . Financial resource strain: Not very hard  . Food insecurity:    Worry: Never true    Inability: Never true  . Transportation needs:    Medical: No    Non-medical: No  Tobacco Use  . Smoking status: Former Smoker    Types: Cigarettes  . Smokeless tobacco: Never Used  . Tobacco comment: > 40 years ago  Substance and Sexual Activity  . Alcohol use: No  . Drug use: No  . Sexual activity: Yes  Lifestyle  . Physical activity:    Days per week: 0 days    Minutes per session: 0 min  . Stress: Not at all  Relationships  . Social  connections:    Talks on phone: Patient refused    Gets together: Patient refused    Attends religious service: Patient refused    Active member of club or organization: Patient refused    Attends meetings of clubs or organizations: Patient refused    Relationship status: Patient refused  Other Topics Concern  . Not on file  Social History Narrative   Pt has trouble sleeping due to sleep apnea. Pt f/u on this with Dr Alva Garnet.    Outpatient Encounter Medications as of 10/14/2018  Medication Sig  . albuterol (PROAIR HFA) 108 (90 Base) MCG/ACT inhaler INHALE 2 SPRAYS EVERY 4 HOURS AS NEEDED  . albuterol (PROVENTIL) (2.5 MG/3ML) 0.083% nebulizer solution Take 3 mLs (2.5 mg total) by nebulization every 4 (four) hours as needed for wheezing or shortness of breath.  . fluticasone (FLOVENT HFA) 110 MCG/ACT inhaler INHALE 2 PUFFS INTO THE LUNGS TWICE A DAY (Patient taking differently: Inhale 2 puffs into the lungs daily. INHALE 2 PUFFS INTO THE LUNGS ONCE A DAY)  . hydrochlorothiazide (HYDRODIURIL) 25 MG tablet TAKE 1 TABLET (25 MG TOTAL) BY MOUTH DAILY.  . Multiple Vitamins-Minerals (MULTIVITAMIN WITH MINERALS) tablet Take 1 tablet by mouth daily.    No facility-administered encounter medications on file as of 10/14/2018.     Activities of Daily Living In your present state of health, do you have any difficulty performing the following activities: 10/14/2018  Hearing? N  Vision? N  Comment Wears eye glasses daily.   Difficulty concentrating or making decisions? N  Walking or climbing stairs? N  Dressing or bathing? N  Doing errands, shopping? N  Preparing Food and eating ? N  Using the Toilet? N  In the past six months, have you accidently leaked urine? N  Do you have problems with loss of bowel control? N  Managing your Medications? N  Managing your Finances? N  Housekeeping or managing your Housekeeping? N  Some recent data might be hidden    Patient Care Team: Jerrol Banana., MD as PCP - General (Family Medicine) Byrnett, [name], MD (General Surgery) Elsie Saas, MD as Consulting Physician (Orthopedic Surgery) Lorelee Cover., MD as Consulting Physician (Ophthalmology) Wilhelmina Mcardle, MD as Consulting Physician (Pulmonary Disease)   Assessment:   This is a routine wellness examination for [place].  Exercise Activities and Dietary recommendations Current Exercise Habits: Structured exercise class, Type of exercise: walking, Time (Minutes): 60, Frequency (Times/Week): 1, Weekly Exercise (Minutes/Week): 60, Intensity: Mild, Exercise limited by: None identified  Goals    .  Exercise 150 minutes a week     Recommend starting back exercising for at least 150 minutes a week.     . Increase water intake     Starting 08/27/16, I will increase my water intake to 4-5 glasses a day.       Fall Risk Fall Risk  10/14/2018 09/01/2017 08/27/2016 09/04/2015  Falls in the past year? 0 Yes No No  Number falls in past yr: - 1 - -  Comment - missed a step - -  Injury with Fall? - No - -  Follow up - Falls prevention discussed - -   FALL RISK PREVENTION PERTAINING TO THE HOME: Any stairs in or around the home? Yes  If so, do they handrails? Yes   Home free of loose throw rugs in walkways, pet beds, electrical cords, etc? Yes  Adequate lighting in your home to reduce risk of falls? Yes   ASSISTIVE DEVICES UTILIZED TO PREVENT FALLS:  Life alert? Yes  Use of a cane, walker or w/c? No  Grab bars in the bathroom? No  Shower chair or bench in shower? No  Elevated toilet seat or a handicapped toilet? No    TIMED UP AND GO:  Was the test performed? No .    Depression Screen PHQ 2/9 Scores 10/14/2018 10/14/2018 09/01/2017 09/01/2017  PHQ - 2 Score 0 0 0 0  PHQ- 9 Score 0 - 6 -    Cognitive Function     6CIT Screen 10/14/2018 08/27/2016  What Year? 0 points 0 points  What month? 0 points 0 points  What time? 0 points 0 points  Count back from 20 0 points 0  points  Months in reverse 0 points 0 points  Repeat phrase 0 points 4 points  Total Score 0 4    Immunization History  Administered Date(s) Administered  . Pneumococcal Conjugate-13 07/27/2014  . Tdap 07/27/2014    Qualifies for Shingles Vaccine? Yes . Due for Shingrix. Education has been provided regarding the importance of this vaccine. Pt has been advised to call insurance company to determine out of pocket expense. Advised may also receive vaccine at local pharmacy or Health Dept. Verbalized acceptance and understanding.  Tdap: Up to date  Flu Vaccine: Due for Flu vaccine. Does the patient want to receive this vaccine today?  No . Education has been provided regarding the importance of this vaccine but still declined. Advised may receive this vaccine at local pharmacy or Health Dept. Aware to provide a copy of the vaccination record if obtained from local pharmacy or Health Dept. Verbalized acceptance and understanding.  Pneumococcal Vaccine: Due for Pneumococcal vaccine. Does the patient want to receive this vaccine today?  No . Education has been provided regarding the importance of this vaccine but still declined. Advised may receive this vaccine at local pharmacy or Health Dept. Aware to provide a copy of the vaccination record if obtained from local pharmacy or Health Dept. Verbalized acceptance and understanding.   Screening Tests Health Maintenance  Topic Date Due  . PNA vac Low Risk Adult (2 of 2 - PPSV23) 07/28/2015  . INFLUENZA VACCINE  11/22/2018 (Originally 03/24/2018)  . COLONOSCOPY  07/02/2019  . TETANUS/TDAP  07/27/2024  . Hepatitis C Screening  Completed   Cancer Screenings:  Colorectal Screening: Completed 07/01/16. Repeat every 3 years.  Lung Cancer Screening: (Low Dose CT Chest recommended if Age 54-80 years, 30 pack-year currently smoking OR have quit w/in 15years.) does not qualify.  Additional Screening:  Hepatitis C Screening: Up to date  Vision  Screening: Recommended annual ophthalmology exams for early detection of glaucoma and other disorders of the eye.  Dental Screening: Recommended annual dental exams for proper oral hygiene  Community Resource Referral:  CRR required this visit?  No        Plan:  I have personally reviewed and addressed the Medicare Annual Wellness questionnaire and have noted the following in the patient's chart:  A. Medical and social history B. Use of alcohol, tobacco or illicit drugs  C. Current medications and supplements D. Functional ability and status E.  Nutritional status F.  Physical activity G. Advance directives H. List of other physicians I.  Hospitalizations, surgeries, and ER visits in previous 12 months J.  [place] such as hearing and vision if needed, cognitive and depression L. Referrals and appointments - none  In addition, I have reviewed and discussed with patient certain preventive protocols, quality metrics, and best practice recommendations. A written personalized care plan for preventive services as well as general preventive health recommendations were provided to patient.  See attached scanned questionnaire for additional information.   Signed,  Fabio Neighbors, LPN Nurse Health Advisor   Nurse Recommendations: Pt declined the tetanus and influenza vaccines today.

## 2018-10-17 ENCOUNTER — Encounter: Payer: Self-pay | Admitting: Family Medicine

## 2018-10-27 ENCOUNTER — Other Ambulatory Visit: Payer: Self-pay | Admitting: Family Medicine

## 2018-10-31 ENCOUNTER — Ambulatory Visit: Payer: PPO | Admitting: Family Medicine

## 2018-11-01 ENCOUNTER — Ambulatory Visit (INDEPENDENT_AMBULATORY_CARE_PROVIDER_SITE_OTHER): Payer: PPO | Admitting: Family Medicine

## 2018-11-01 ENCOUNTER — Encounter: Payer: Self-pay | Admitting: Family Medicine

## 2018-11-01 VITALS — BP 126/78 | HR 80 | Temp 98.3°F | Resp 16 | Ht 68.0 in | Wt 230.0 lb

## 2018-11-01 DIAGNOSIS — J4521 Mild intermittent asthma with (acute) exacerbation: Secondary | ICD-10-CM | POA: Diagnosis not present

## 2018-11-01 DIAGNOSIS — G4733 Obstructive sleep apnea (adult) (pediatric): Secondary | ICD-10-CM | POA: Diagnosis not present

## 2018-11-01 DIAGNOSIS — I10 Essential (primary) hypertension: Secondary | ICD-10-CM

## 2018-11-01 DIAGNOSIS — E785 Hyperlipidemia, unspecified: Secondary | ICD-10-CM

## 2018-11-01 DIAGNOSIS — Z1211 Encounter for screening for malignant neoplasm of colon: Secondary | ICD-10-CM | POA: Diagnosis not present

## 2018-11-01 DIAGNOSIS — Z0000 Encounter for general adult medical examination without abnormal findings: Secondary | ICD-10-CM

## 2018-11-01 DIAGNOSIS — Z9989 Dependence on other enabling machines and devices: Secondary | ICD-10-CM

## 2018-11-01 NOTE — Progress Notes (Signed)
Patient: Kenneth Hester, Male    DOB: 06/26/1951, 68 y.o.   MRN: [id number] Visit Date: 11/01/2018  Today's Provider: Wilhemena Durie, MD   Chief Complaint  Patient presents with  . Annual Exam   Subjective:   Patient saw Alyson Ingles for AWE on 10/14/2018.    Annual physical exam Kenneth Hester is a 68 y.o. male. He feels well. He reports exercising not regularly, but he does stay active. He reports he is sleeping well.   Colonoscopy- 07/01/2016. Repeat in 3 years. Several polyps removed. Has a tubulovillous history.  Tdap- 07/27/2014.   Patient also mentions that he has been going to the [facility] that specializes in ED. He reports that the testosterone injections seem to be helping.   Review of Systems  Constitutional: Negative.   HENT: Negative.   Eyes: Negative.   Respiratory: Negative.   Cardiovascular: Negative.   Gastrointestinal: Negative.   Endocrine: Negative.   Genitourinary: Negative.   Musculoskeletal: Negative.   Skin: Negative.   Allergic/Immunologic: Negative.   Neurological: Negative.   Hematological: Negative.   Psychiatric/Behavioral: Negative.     Social History   Socioeconomic History  . Marital status: Married    Spouse name: Pamala Hurry  . Number of children: 2  . Years of education: Not on file  . Highest education level: 12th grade  Occupational History  . Occupation: Cabin crew  Social Needs  . Financial resource strain: Not very hard  . Food insecurity:    Worry: Never true    Inability: Never true  . Transportation needs:    Medical: No    Non-medical: No  Tobacco Use  . Smoking status: Former Smoker    Types: Cigarettes  . Smokeless tobacco: Never Used  . Tobacco comment: > 40 years ago  Substance and Sexual Activity  . Alcohol use: No  . Drug use: No  . Sexual activity: Yes  Lifestyle  . Physical activity:    Days per week: 0 days    Minutes per session: 0 min  . Stress: Not at all  Relationships  .  Social connections:    Talks on phone: Patient refused    Gets together: Patient refused    Attends religious service: Patient refused    Active member of club or organization: Patient refused    Attends meetings of clubs or organizations: Patient refused    Relationship status: Patient refused  . Intimate partner violence:    Fear of current or ex partner: No    Emotionally abused: No    Physically abused: No    Forced sexual activity: No  Other Topics Concern  . Not on file  Social History Narrative   Pt has trouble sleeping due to sleep apnea. Pt f/u on this with Dr Alva Garnet.    Past Medical History:  Diagnosis Date  . Arthritis   . Asthma   . Family history of adverse reaction to anesthesia    Patients mother went into cardiac arrest twice during surgery 20 years ago  . GERD (gastroesophageal reflux disease)   . Hypertension   . Pneumonia    hx of  . Primary localized osteoarthritis of left knee 08/26/2016  . Primary localized osteoarthritis of right knee   . Sleep apnea    wears CPAP nightly  . Urgency of urination      Patient Active Problem List   Diagnosis Date Noted  . 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  Primary localized osteoarthritis of left knee 08/26/2016  . Umbilical hernia without obstruction and without gangrene 05/26/2016  . Encounter for screening colonoscopy 12/03/2015  . Primary localized osteoarthritis of right knee   . Hypertension   . Chest pain 12/05/2013  . Sleep apnea 12/05/2013  . Morbid obesity ([place]) 12/05/2013  . GERD (gastroesophageal reflux disease) 12/05/2013  . Asthma 12/05/2013    Past Surgical History:  Procedure Laterality Date  . APPENDECTOMY    . COLONOSCOPY  2007  . COLONOSCOPY W/ POLYPECTOMY    . COLONOSCOPY WITH PROPOFOL N/A 07/01/2016   Procedure: COLONOSCOPY WITH PROPOFOL;  Surgeon: Robert Bellow, MD;  Location: [facility] ENDOSCOPY;  Service: Endoscopy;  Laterality: N/A;  . HERNIA REPAIR     68 yrs  old and 68 yrs old  . hydrocelectomy    . KNEE SURGERY Right   . SKIN GRAFT    . SKIN SPLIT GRAFT    . TOTAL KNEE ARTHROPLASTY Right 10/07/2015   Procedure: TOTAL KNEE ARTHROPLASTY;  Surgeon: Elsie Saas, MD;  Location: [facility];  Service: Orthopedics;  Laterality: Right;  . TOTAL KNEE ARTHROPLASTY Left 09/07/2016   Procedure: TOTAL KNEE ARTHROPLASTY;  Surgeon: Elsie Saas, MD;  Location: [place];  Service: Orthopedics;  Laterality: Left;    His family history includes Cancer in his father; Diabetes in his mother, sister, and son; Heart attack in his maternal grandfather, mother, and sister; Heart disease in his father, mother, sister, and son; Heart failure in his mother; Hypertension in his mother.   Current Outpatient Medications:  .  albuterol (PROAIR HFA) 108 (90 Base) MCG/ACT inhaler, INHALE 2 SPRAYS EVERY 4 HOURS AS NEEDED, Disp: 3 Inhaler, Rfl: 11 .  albuterol (PROVENTIL) (2.5 MG/3ML) 0.083% nebulizer solution, Take 3 mLs (2.5 mg total) by nebulization every 4 (four) hours as needed for wheezing or shortness of breath., Disp: 75 mL, Rfl: 0 .  fluticasone (FLOVENT HFA) 110 MCG/ACT inhaler, INHALE 2 PUFFS INTO THE LUNGS TWICE A DAY, Disp: 36 Inhaler, Rfl: 3 .  hydrochlorothiazide (HYDRODIURIL) 25 MG tablet, TAKE 1 TABLET (25 MG TOTAL) BY MOUTH DAILY., Disp: 90 tablet, Rfl: 3 .  Multiple Vitamins-Minerals (MULTIVITAMIN WITH MINERALS) tablet, Take 1 tablet by mouth daily. , Disp: , Rfl:   Patient Care Team: Jerrol Banana., MD as PCP - General (Family Medicine) Bary Castilla, [name], MD (General Surgery) Elsie Saas, MD as Consulting Physician (Orthopedic Surgery) Lorelee Cover., MD as Consulting Physician (Ophthalmology) Wilhelmina Mcardle, MD as Consulting Physician (Pulmonary Disease)    Objective:    Vitals: BP 126/78 (BP Location: Right Arm, Patient Position: Sitting, Cuff Size: Large)   Pulse 80   Temp 98.3 F (36.8 C)   Resp 16   Ht 5\' 8"  (1.727 m)   Wt 230 lb  (104.3 kg)   SpO2 96%   BMI 34.97 kg/m   Physical Exam Vitals signs reviewed.  Constitutional:      Appearance: Normal appearance. He is well-developed.  HENT:     Head: Normocephalic and atraumatic.     Right Ear: External ear normal.     Left Ear: External ear normal.     Nose: Nose normal.  Eyes:     General: No scleral icterus.    Conjunctiva/sclera: Conjunctivae normal.  Neck:     Thyroid: No thyromegaly.  Cardiovascular:     Rate and Rhythm: Normal rate and regular rhythm.     Heart sounds: Normal heart sounds.  Pulmonary:  Effort: Pulmonary effort is normal.     Breath sounds: Normal breath sounds.  Abdominal:     General: Bowel sounds are normal.     Palpations: Abdomen is soft.     Comments: Small umbilical hernia present.  Genitourinary:    Penis: Normal.      Prostate: Normal.     Rectum: Normal.  Musculoskeletal:     Right lower leg: No edema.     Left lower leg: No edema.  Skin:    General: Skin is warm and dry.  Neurological:     General: No focal deficit present.     Mental Status: He is alert and oriented to person, place, and time. Mental status is at baseline.  Psychiatric:        Mood and Affect: Mood normal.        Behavior: Behavior normal.        Thought Content: Thought content normal.        Judgment: Judgment normal.     Activities of Daily Living In your present state of health, do you have any difficulty performing the following activities: 10/14/2018  Hearing? N  Vision? N  Comment Wears eye glasses daily.   Difficulty concentrating or making decisions? N  Walking or climbing stairs? N  Dressing or bathing? N  Doing errands, shopping? N  Preparing Food and eating ? N  Using the Toilet? N  In the past six months, have you accidently leaked urine? N  Do you have problems with loss of bowel control? N  Managing your Medications? N  Managing your Finances? N  Housekeeping or managing your Housekeeping? N  Some recent data  might be hidden    Fall Risk Assessment Fall Risk  10/14/2018 09/01/2017 08/27/2016 09/04/2015  Falls in the past year? 0 Yes No No  Number falls in past yr: - 1 - -  Comment - missed a step - -  Injury with Fall? - No - -  Follow up - Falls prevention discussed - -     Depression Screen PHQ 2/9 Scores 10/14/2018 10/14/2018 09/01/2017 09/01/2017  PHQ - 2 Score 0 0 0 0  PHQ- 9 Score 0 - 6 -    6CIT Screen 10/14/2018  What Year? 0 points  What month? 0 points  What time? 0 points  Count back from 20 0 points  Months in reverse 0 points  Repeat phrase 0 points  Total Score 0      Assessment & Plan:     Annual Wellness Visit  Reviewed patient's Family Medical History Reviewed and updated list of patient's medical providers Assessment of cognitive impairment was done Assessed patient's functional ability Established a written schedule for health screening [place] Completed and Reviewed  Exercise Activities and Dietary recommendations Goals    . Exercise 150 minutes a week     Recommend starting back exercising for at least 150 minutes a week.     . Increase water intake     Starting 08/27/16, I will increase my water intake to 4-5 glasses a day.       Immunization History  Administered Date(s) Administered  . Pneumococcal Conjugate-13 07/27/2014  . Tdap 07/27/2014    Health Maintenance  Topic Date Due  . PNA vac Low Risk Adult (2 of 2 - PPSV23) 07/28/2015  . INFLUENZA VACCINE  11/22/2018 (Originally 03/24/2018)  . COLONOSCOPY  07/02/2019  . TETANUS/TDAP  07/27/2024  . Hepatitis C Screening  Completed  Discussed health benefits of physical activity, and encouraged him to engage in regular exercise appropriate for his age and condition.  1. Encounter for annual physical exam Lifestyle with diet and exercise discussed.  Dermatology per Dr. Elvera Lennox in [place].  2. Essential hypertension Controlled. - Comprehensive metabolic panel  3.  Hyperlipidemia, unspecified hyperlipidemia type  - Lipid panel - TSH  4. Mild intermittent asthma with acute exacerbation  - CBC with Differential/Platelet  5. Colon cancer screening  - Ambulatory referral to Gastroenterology  6. OSA on CPAP  7.Umbilical Hernia    I have done the exam and reviewed the above chart and it is accurate to the best of my knowledge. Development worker, community has been used in this note in any air is in the dictation or transcription are unintentional.  Wilhemena Durie, MD  [facility] [facility]

## 2018-11-02 ENCOUNTER — Telehealth: Payer: Self-pay

## 2018-11-02 LAB — CBC WITH DIFFERENTIAL/PLATELET
BASOS ABS: 0.1 10*3/uL (ref 0.0–0.2)
Basos: 1 %
EOS (ABSOLUTE): 0.3 10*3/uL (ref 0.0–0.4)
EOS: 3 %
Hematocrit: 51.1 % — ABNORMAL HIGH (ref 37.5–51.0)
Hemoglobin: 17.6 g/dL (ref 13.0–17.7)
IMMATURE GRANULOCYTES: 0 %
Immature Grans (Abs): 0 10*3/uL (ref 0.0–0.1)
LYMPHS ABS: 2.4 10*3/uL (ref 0.7–3.1)
Lymphs: 27 %
MCH: 33.1 pg — ABNORMAL HIGH (ref 26.6–33.0)
MCHC: 34.4 g/dL (ref 31.5–35.7)
MCV: 96 fL (ref 79–97)
Monocytes Absolute: 0.9 10*3/uL (ref 0.1–0.9)
Monocytes: 10 %
NEUTROS PCT: 59 %
Neutrophils Absolute: 5.2 10*3/uL (ref 1.4–7.0)
Platelets: 162 10*3/uL (ref 150–450)
RBC: 5.32 x10E6/uL (ref 4.14–5.80)
RDW: 12.3 % (ref 11.6–15.4)
WBC: 8.8 10*3/uL (ref 3.4–10.8)

## 2018-11-02 LAB — COMPREHENSIVE METABOLIC PANEL
A/G RATIO: 2.5 — AB (ref 1.2–2.2)
ALK PHOS: 46 IU/L (ref 39–117)
ALT: 22 IU/L (ref 0–44)
AST: 29 IU/L (ref 0–40)
Albumin: 4.5 g/dL (ref 3.8–4.8)
BUN/Creatinine Ratio: 16 (ref 10–24)
BUN: 20 mg/dL (ref 8–27)
Bilirubin Total: 0.6 mg/dL (ref 0.0–1.2)
CALCIUM: 9.6 mg/dL (ref 8.6–10.2)
CHLORIDE: 101 mmol/L (ref 96–106)
CO2: 23 mmol/L (ref 20–29)
Creatinine, Ser: 1.23 mg/dL (ref 0.76–1.27)
GFR calc Af Amer: 69 mL/min/{1.73_m2} (ref >59)
GFR, EST NON AFRICAN AMERICAN: 60 mL/min/{1.73_m2} (ref >59)
GLOBULIN, TOTAL: 1.8 g/dL (ref 1.5–4.5)
Glucose: 86 mg/dL (ref 65–99)
POTASSIUM: 4.1 mmol/L (ref 3.5–5.2)
SODIUM: 139 mmol/L (ref 134–144)
Total Protein: 6.3 g/dL (ref 6.0–8.5)

## 2018-11-02 LAB — TSH: TSH: 1.91 u[IU]/mL (ref 0.450–4.500)

## 2018-11-02 LAB — LIPID PANEL
CHOL/HDL RATIO: 6 ratio — AB (ref 0.0–5.0)
CHOLESTEROL TOTAL: 209 mg/dL — AB (ref 100–199)
HDL: 35 mg/dL — ABNORMAL LOW (ref >39)
LDL Calculated: 148 mg/dL — ABNORMAL HIGH (ref 0–99)
TRIGLYCERIDES: 130 mg/dL (ref 0–149)
VLDL Cholesterol Cal: 26 mg/dL (ref 5–40)

## 2018-11-02 NOTE — Telephone Encounter (Signed)
Pt.advised.KW 

## 2018-11-02 NOTE — Telephone Encounter (Signed)
-----   Message from Jerrol Banana., MD sent at 11/02/2018  8:34 AM EDT ----- Labs stable.

## 2018-11-10 DIAGNOSIS — L570 Actinic keratosis: Secondary | ICD-10-CM | POA: Diagnosis not present

## 2018-12-29 DIAGNOSIS — R972 Elevated prostate specific antigen [PSA]: Secondary | ICD-10-CM | POA: Diagnosis not present

## 2018-12-29 DIAGNOSIS — E291 Testicular hypofunction: Secondary | ICD-10-CM | POA: Diagnosis not present

## 2019-03-06 DIAGNOSIS — R972 Elevated prostate specific antigen [PSA]: Secondary | ICD-10-CM | POA: Diagnosis not present

## 2019-03-06 DIAGNOSIS — E291 Testicular hypofunction: Secondary | ICD-10-CM | POA: Diagnosis not present

## 2019-03-14 ENCOUNTER — Other Ambulatory Visit: Payer: Self-pay

## 2019-03-14 ENCOUNTER — Encounter: Payer: Self-pay | Admitting: Family Medicine

## 2019-03-14 ENCOUNTER — Ambulatory Visit (INDEPENDENT_AMBULATORY_CARE_PROVIDER_SITE_OTHER): Payer: PPO | Admitting: Family Medicine

## 2019-03-14 VITALS — BP 148/62 | HR 72 | Temp 98.9°F | Resp 16 | Wt 230.0 lb

## 2019-03-14 DIAGNOSIS — F419 Anxiety disorder, unspecified: Secondary | ICD-10-CM | POA: Diagnosis not present

## 2019-03-14 DIAGNOSIS — I10 Essential (primary) hypertension: Secondary | ICD-10-CM | POA: Diagnosis not present

## 2019-03-14 DIAGNOSIS — R002 Palpitations: Secondary | ICD-10-CM

## 2019-03-14 DIAGNOSIS — G4733 Obstructive sleep apnea (adult) (pediatric): Secondary | ICD-10-CM | POA: Diagnosis not present

## 2019-03-14 NOTE — Progress Notes (Signed)
Patient: Kenneth Hester Male    DOB: 03-Jul-1951   68 y.o.   MRN: [id number] Visit Date: 03/14/2019  Today's Provider: Wilhemena Durie, MD   Chief Complaint  Patient presents with  . Palpitations   Subjective:   Palpitations  This is a new problem. The current episode started yesterday. The problem occurs intermittently. The problem has been gradually worsening. Associated symptoms include anxiety.   Patient reports that while sleeping he woke up in a panic attack. He uses a CPAP machine, and feels that he may have stopped breathing. Denies any chest pain. He has not been wearing CPAP except for last night.He feels he has had some recent palpitations without syncope or presyncope.He has predictable DOE with no chest pain.he is worried about heart disease.FH with father with CAD.  Allergies  Allergen Reactions  . Lisinopril Cough    cough, voice was affected and had hard time singing  . Achromycin [Tetracycline] Rash     Current Outpatient Medications:  .  albuterol (PROAIR HFA) 108 (90 Base) MCG/ACT inhaler, INHALE 2 SPRAYS EVERY 4 HOURS AS NEEDED, Disp: 3 Inhaler, Rfl: 11 .  albuterol (PROVENTIL) (2.5 MG/3ML) 0.083% nebulizer solution, Take 3 mLs (2.5 mg total) by nebulization every 4 (four) hours as needed for wheezing or shortness of breath., Disp: 75 mL, Rfl: 0 .  fluticasone (FLOVENT HFA) 110 MCG/ACT inhaler, INHALE 2 PUFFS INTO THE LUNGS TWICE A DAY, Disp: 36 Inhaler, Rfl: 3 .  hydrochlorothiazide (HYDRODIURIL) 25 MG tablet, TAKE 1 TABLET (25 MG TOTAL) BY MOUTH DAILY., Disp: 90 tablet, Rfl: 3 .  Multiple Vitamins-Minerals (MULTIVITAMIN WITH MINERALS) tablet, Take 1 tablet by mouth daily. , Disp: , Rfl:   Review of Systems  Constitutional: Negative.   HENT: Negative.   Eyes: Negative.   Respiratory: Negative.   Cardiovascular: Positive for palpitations.  Gastrointestinal: Negative.   Endocrine: Negative.   Allergic/Immunologic: Negative.   Neurological:  Negative.   Psychiatric/Behavioral: The patient is nervous/anxious.     Social History   Tobacco Use  . Smoking status: Former Smoker    Types: Cigarettes  . Smokeless tobacco: Never Used  . Tobacco comment: > 40 years ago  Substance Use Topics  . Alcohol use: No      Objective:   BP (!) 148/62   Pulse 72   Temp 98.9 F (37.2 C)   Resp 16   Wt 230 lb (104.3 kg)   SpO2 98%   BMI 34.97 kg/m  Vitals:   03/14/19 1406  BP: (!) 148/62  Pulse: 72  Resp: 16  Temp: 98.9 F (37.2 C)  SpO2: 98%  Weight: 230 lb (104.3 kg)     Physical Exam Vitals signs reviewed.  Constitutional:      Appearance: He is well-developed.  HENT:     Head: Normocephalic and atraumatic.     Right Ear: External ear normal.     Left Ear: External ear normal.     Nose: Nose normal.  Eyes:     General: No scleral icterus.    Conjunctiva/sclera: Conjunctivae normal.  Neck:     Thyroid: No thyromegaly.  Cardiovascular:     Rate and Rhythm: Normal rate and regular rhythm.     Heart sounds: Normal heart sounds.  Pulmonary:     Effort: Pulmonary effort is normal.     Breath sounds: Normal breath sounds.  Abdominal:     General: Bowel sounds are normal.     Palpations:  Abdomen is soft.  Musculoskeletal:     Right lower leg: No edema.     Left lower leg: No edema.  Skin:    General: Skin is warm and dry.  Neurological:     General: No focal deficit present.     Mental Status: He is alert and oriented to person, place, and time.  Psychiatric:        Mood and Affect: Mood normal.        Behavior: Behavior normal.        Thought Content: Thought content normal.        Judgment: Judgment normal.    ECG reveals NSR with PACs.  No results found for any visits on 03/14/19.     Assessment & Plan    1. Palpitations ASA 81 mg daily until he sees cardiology. Clinically pt ok but may neep loop monitor or w/u as indicated. - EKG 12-Lead - Ambulatory referral to Cardiology  2. Essential  hypertension Fair control.  3. Obstructive sleep apnea syndrome Pt to wear CPAP.More than 50% 25 minute visit spent in counseling or coordination of care  4. Anxiety Will follow.avoid benzos for now. May need SSRI.      Cranford Mon, MD  [facility]

## 2019-03-14 NOTE — Patient Instructions (Signed)
Start Aspirin 81 mg daily. 

## 2019-03-15 ENCOUNTER — Encounter: Payer: Self-pay | Admitting: General Surgery

## 2019-03-23 DIAGNOSIS — E291 Testicular hypofunction: Secondary | ICD-10-CM | POA: Diagnosis not present

## 2019-04-13 DIAGNOSIS — H43811 Vitreous degeneration, right eye: Secondary | ICD-10-CM | POA: Diagnosis not present

## 2019-05-03 ENCOUNTER — Other Ambulatory Visit: Payer: Self-pay

## 2019-05-03 ENCOUNTER — Telehealth: Payer: Self-pay

## 2019-05-03 DIAGNOSIS — L918 Other hypertrophic disorders of the skin: Secondary | ICD-10-CM | POA: Diagnosis not present

## 2019-05-03 DIAGNOSIS — Z1211 Encounter for screening for malignant neoplasm of colon: Secondary | ICD-10-CM

## 2019-05-03 DIAGNOSIS — L820 Inflamed seborrheic keratosis: Secondary | ICD-10-CM | POA: Diagnosis not present

## 2019-05-03 DIAGNOSIS — D692 Other nonthrombocytopenic purpura: Secondary | ICD-10-CM | POA: Diagnosis not present

## 2019-05-03 DIAGNOSIS — E291 Testicular hypofunction: Secondary | ICD-10-CM | POA: Diagnosis not present

## 2019-05-03 DIAGNOSIS — L570 Actinic keratosis: Secondary | ICD-10-CM | POA: Diagnosis not present

## 2019-05-03 DIAGNOSIS — Z86010 Personal history of colonic polyps: Secondary | ICD-10-CM

## 2019-05-03 DIAGNOSIS — D225 Melanocytic nevi of trunk: Secondary | ICD-10-CM | POA: Diagnosis not present

## 2019-05-03 DIAGNOSIS — L821 Other seborrheic keratosis: Secondary | ICD-10-CM | POA: Diagnosis not present

## 2019-05-03 DIAGNOSIS — R972 Elevated prostate specific antigen [PSA]: Secondary | ICD-10-CM | POA: Diagnosis not present

## 2019-05-03 MED ORDER — NA SULFATE-K SULFATE-MG SULF 17.5-3.13-1.6 GM/177ML PO SOLN: 1.0000 | Freq: Once | ORAL | 0 refills | Status: AC

## 2019-05-03 NOTE — Telephone Encounter (Signed)
Gastroenterology Pre-Procedure Review  Request Date: 06/23/19 Requesting Physician: Dr. Marius Ditch  PATIENT REVIEW QUESTIONS: The patient responded to the following health history questions as indicated:    1. Are you having any GI issues? no 2. Do you have a personal history of Polyps? yes (2017 COLONOSCOPY WITH DR. BYRNETT) 3. Do you have a family history of Colon Cancer or Polyps? no 4. Diabetes Mellitus? no 5. Joint replacements in the past 12 months?yes (2018 KNEE REPLACEMENT SURGERY) 6. Major health problems in the past 3 months?no 7. Any artificial heart valves, MVP, or defibrillator?no    MEDICATIONS & ALLERGIES:    Patient reports the following regarding taking any anticoagulation/antiplatelet therapy:   Plavix, Coumadin, Eliquis, Xarelto, Lovenox, Pradaxa, Brilinta, or Effient? no Aspirin? no  Patient confirms/reports the following medications:  Current Outpatient Medications  Medication Sig Dispense Refill  . albuterol (PROAIR HFA) 108 (90 Base) MCG/ACT inhaler INHALE 2 SPRAYS EVERY 4 HOURS AS NEEDED 3 Inhaler 11  . albuterol (PROVENTIL) (2.5 MG/3ML) 0.083% nebulizer solution Take 3 mLs (2.5 mg total) by nebulization every 4 (four) hours as needed for wheezing or shortness of breath. 75 mL 0  . fluticasone (FLOVENT HFA) 110 MCG/ACT inhaler INHALE 2 PUFFS INTO THE LUNGS TWICE A DAY 36 Inhaler 3  . hydrochlorothiazide (HYDRODIURIL) 25 MG tablet TAKE 1 TABLET (25 MG TOTAL) BY MOUTH DAILY. 90 tablet 3  . Multiple Vitamins-Minerals (MULTIVITAMIN WITH MINERALS) tablet Take 1 tablet by mouth daily.      No current facility-administered medications for this visit.     Patient confirms/reports the following allergies:  Allergies  Allergen Reactions  . Lisinopril Cough    cough, voice was affected and had hard time singing  . Achromycin [Tetracycline] Rash    No orders of the defined types were placed in this encounter.   AUTHORIZATION INFORMATION Primary  Insurance: 1D#: Group #:  Secondary Insurance: 1D#: Group #:  SCHEDULE INFORMATION: Date: 06/23/19 Time: Location:[facility]

## 2019-05-11 ENCOUNTER — Telehealth: Payer: Self-pay | Admitting: Interventional Cardiology

## 2019-05-11 NOTE — Telephone Encounter (Signed)
Left message letting pt know that Dr. Tamala Julian will not be in the office 9/29 but will be doing virtual/telephone visits instead.  Advised to call back to make arrangements for appt. Pt lives in [place] so I also mentioned he could be set up to see a physician out there if preferred.

## 2019-05-15 DIAGNOSIS — H43811 Vitreous degeneration, right eye: Secondary | ICD-10-CM | POA: Diagnosis not present

## 2019-05-18 DIAGNOSIS — S6391XA Sprain of unspecified part of right wrist and hand, initial encounter: Secondary | ICD-10-CM | POA: Diagnosis not present

## 2019-05-22 ENCOUNTER — Telehealth: Payer: Self-pay | Admitting: Family Medicine

## 2019-05-22 NOTE — Telephone Encounter (Signed)
Per appt note, pt called and cancelled due to a personal issue and will call back to reschedule.

## 2019-05-22 NOTE — Telephone Encounter (Signed)
ERROR

## 2019-05-23 ENCOUNTER — Ambulatory Visit: Payer: PPO | Admitting: Interventional Cardiology

## 2019-06-05 ENCOUNTER — Telehealth: Payer: Self-pay | Admitting: Gastroenterology

## 2019-06-05 NOTE — Telephone Encounter (Signed)
Patient has been advised to check with his ortho-surgeon in regards to whether or not he will need to take an antibiotic prior to his colonoscopy, as we would be unable to prescribe the antibiotic if it is needed.  Thanks [employer]

## 2019-06-05 NOTE — Telephone Encounter (Signed)
Is calling he scheduled a procedure for 06/23/19 he had had 2 knee replacements and usually has to do an antibiotic with procedures he wanted to check if that will be a requirement for his procedure as well please call pt  cb [phone]

## 2019-06-20 ENCOUNTER — Other Ambulatory Visit: Payer: Self-pay

## 2019-06-20 ENCOUNTER — Other Ambulatory Visit
Admission: RE | Admit: 2019-06-20 | Discharge: 2019-06-20 | Disposition: A | Discharge status: Home / Self Care | Payer: PPO | Source: Ambulatory Visit | Attending: Gastroenterology | Admitting: Gastroenterology

## 2019-06-20 DIAGNOSIS — Z01812 Encounter for preprocedural laboratory examination: Secondary | ICD-10-CM | POA: Insufficient documentation

## 2019-06-20 DIAGNOSIS — Z20828 Contact with and (suspected) exposure to other viral communicable diseases: Secondary | ICD-10-CM | POA: Diagnosis not present

## 2019-06-20 LAB — SARS CORONAVIRUS 2 (TAT 6-24 HRS): SARS Coronavirus 2: NEGATIVE

## 2019-06-23 ENCOUNTER — Encounter
Admission: RE | Disposition: A | Discharge status: Home / Self Care | Payer: Self-pay | Source: Home / Self Care | Attending: Gastroenterology

## 2019-06-23 ENCOUNTER — Ambulatory Visit: Payer: PPO

## 2019-06-23 ENCOUNTER — Ambulatory Visit
Admission: RE | Admit: 2019-06-23 | Discharge: 2019-06-23 | Disposition: A | Discharge status: Home / Self Care | Payer: PPO | Attending: Gastroenterology | Admitting: Gastroenterology

## 2019-06-23 ENCOUNTER — Other Ambulatory Visit: Payer: Self-pay

## 2019-06-23 ENCOUNTER — Encounter: Payer: Self-pay | Admitting: *Deleted

## 2019-06-23 DIAGNOSIS — M1990 Unspecified osteoarthritis, unspecified site: Secondary | ICD-10-CM | POA: Insufficient documentation

## 2019-06-23 DIAGNOSIS — G4730 Sleep apnea, unspecified: Secondary | ICD-10-CM | POA: Diagnosis not present

## 2019-06-23 DIAGNOSIS — J45909 Unspecified asthma, uncomplicated: Secondary | ICD-10-CM | POA: Insufficient documentation

## 2019-06-23 DIAGNOSIS — Z96653 Presence of artificial knee joint, bilateral: Secondary | ICD-10-CM | POA: Diagnosis not present

## 2019-06-23 DIAGNOSIS — K644 Residual hemorrhoidal skin tags: Secondary | ICD-10-CM | POA: Diagnosis not present

## 2019-06-23 DIAGNOSIS — I10 Essential (primary) hypertension: Secondary | ICD-10-CM | POA: Insufficient documentation

## 2019-06-23 DIAGNOSIS — D123 Benign neoplasm of transverse colon: Secondary | ICD-10-CM | POA: Diagnosis not present

## 2019-06-23 DIAGNOSIS — Z888 Allergy status to other drugs, medicaments and biological substances status: Secondary | ICD-10-CM | POA: Insufficient documentation

## 2019-06-23 DIAGNOSIS — D122 Benign neoplasm of ascending colon: Secondary | ICD-10-CM | POA: Insufficient documentation

## 2019-06-23 DIAGNOSIS — Z1211 Encounter for screening for malignant neoplasm of colon: Secondary | ICD-10-CM

## 2019-06-23 DIAGNOSIS — Z881 Allergy status to other antibiotic agents status: Secondary | ICD-10-CM | POA: Insufficient documentation

## 2019-06-23 DIAGNOSIS — Z860100 Personal history of colon polyps, unspecified: Secondary | ICD-10-CM

## 2019-06-23 DIAGNOSIS — K219 Gastro-esophageal reflux disease without esophagitis: Secondary | ICD-10-CM | POA: Insufficient documentation

## 2019-06-23 DIAGNOSIS — Z79899 Other long term (current) drug therapy: Secondary | ICD-10-CM | POA: Diagnosis not present

## 2019-06-23 DIAGNOSIS — Z6831 Body mass index (BMI) 31.0-31.9, adult: Secondary | ICD-10-CM | POA: Insufficient documentation

## 2019-06-23 DIAGNOSIS — Z09 Encounter for follow-up examination after completed treatment for conditions other than malignant neoplasm: Secondary | ICD-10-CM | POA: Diagnosis not present

## 2019-06-23 DIAGNOSIS — Z87891 Personal history of nicotine dependence: Secondary | ICD-10-CM | POA: Diagnosis not present

## 2019-06-23 DIAGNOSIS — K635 Polyp of colon: Secondary | ICD-10-CM | POA: Diagnosis not present

## 2019-06-23 HISTORY — PX: COLONOSCOPY WITH PROPOFOL: SHX5780

## 2019-06-23 SURGERY — COLONOSCOPY WITH PROPOFOL
Anesthesia: General

## 2019-06-23 MED ORDER — PROPOFOL 500 MG/50ML IV EMUL
INTRAVENOUS | Status: AC
  Filled 2019-06-23: qty 50

## 2019-06-23 MED ORDER — PROPOFOL 10 MG/ML IV BOLUS
INTRAVENOUS | Status: AC
  Filled 2019-06-23: qty 20

## 2019-06-23 MED ORDER — LIDOCAINE HCL (CARDIAC) PF 100 MG/5ML IV SOSY
PREFILLED_SYRINGE | INTRAVENOUS | Status: DC | PRN
  Administered 2019-06-23: 50 mg via INTRAVENOUS

## 2019-06-23 MED ORDER — SODIUM CHLORIDE 0.9 % IV SOLN
INTRAVENOUS | Status: DC
  Administered 2019-06-23: 08:00:00 1000 mL via INTRAVENOUS

## 2019-06-23 MED ORDER — PROPOFOL 10 MG/ML IV BOLUS
INTRAVENOUS | Status: DC | PRN
  Administered 2019-06-23: 60 mg via INTRAVENOUS

## 2019-06-23 MED ORDER — PHENYLEPHRINE HCL (PRESSORS) 10 MG/ML IV SOLN
INTRAVENOUS | Status: DC | PRN
  Administered 2019-06-23 (×2): 100 ug via INTRAVENOUS

## 2019-06-23 MED ORDER — LIDOCAINE HCL (PF) 2 % IJ SOLN
INTRAMUSCULAR | Status: AC
  Filled 2019-06-23: qty 10

## 2019-06-23 MED ORDER — PROPOFOL 500 MG/50ML IV EMUL
INTRAVENOUS | Status: DC | PRN
  Administered 2019-06-23: 170 ug/kg/min via INTRAVENOUS

## 2019-06-23 NOTE — Anesthesia Procedure Notes (Signed)
Date/Time: 06/23/2019 8:15 AM Performed by: Johnna Acosta, CRNA Pre-anesthesia Checklist: Patient identified, Emergency Drugs available, Suction available, Patient being monitored and Timeout performed Patient Re-evaluated:Patient Re-evaluated prior to induction Oxygen Delivery Method: Supernova nasal CPAP Preoxygenation: Pre-oxygenation with 100% oxygen Induction Type: IV induction

## 2019-06-23 NOTE — Op Note (Signed)
[facility] Gastroenterology Patient Name: Kenneth Hester Procedure Date: 06/23/2019 8:24 AM MRN: [id number] Account #: [id number] Date of Birth: 12-Nov-1950 Admit Type: Outpatient Age: 68 Room: [facility] ENDO ROOM 3 Gender: Male Note Status: Finalized Procedure:            Colonoscopy Indications:          High risk colon cancer surveillance: Personal history                        of adenoma with villous component, Last colonoscopy:                        November 2017 Providers:            Lin Landsman MD, MD Referring MD:         Janine Ores. Rosanna Randy, MD (Referring MD) Medicines:            Monitored Anesthesia Care Complications:        No immediate complications. Estimated blood loss: None. Procedure:            Pre-Anesthesia Assessment:                       - Prior to the procedure, a History and Physical was                        performed, and patient medications and allergies were                        reviewed. The patient is competent. The risks and                        benefits of the procedure and the sedation options and                        risks were discussed with the patient. All questions                        were answered and informed consent was obtained.                        Patient identification and proposed procedure were                        verified by the physician, the nurse, the                        anesthesiologist, the anesthetist and the technician in                        the pre-procedure area in the procedure room in the                        endoscopy suite. Mental Status Examination: alert and                        oriented. Airway Examination: normal oropharyngeal                        airway and neck mobility. Respiratory Examination:  clear to auscultation. CV Examination: normal.                        Prophylactic Antibiotics: The patient does not require   prophylactic antibiotics. Prior Anticoagulants: The                        patient has taken no previous anticoagulant or                        antiplatelet agents. ASA Grade Assessment: III - A                        patient with severe systemic disease. After reviewing                        the risks and benefits, the patient was deemed in                        satisfactory condition to undergo the procedure. The                        anesthesia plan was to use monitored anesthesia care                        (MAC). Immediately prior to administration of                        medications, the patient was re-assessed for adequacy                        to receive sedatives. The heart rate, respiratory rate,                        oxygen saturations, blood pressure, adequacy of                        pulmonary ventilation, and response to care were                        monitored throughout the procedure. The physical status                        of the patient was re-assessed after the procedure.                       After obtaining informed consent, the colonoscope was                        passed under direct vision. Throughout the procedure,                        the patient's blood pressure, pulse, and oxygen                        saturations were monitored continuously. The                        Colonoscope was introduced through the anus and  advanced to the the cecum, identified by appendiceal                        orifice and ileocecal valve. The colonoscopy was                        performed without difficulty. The patient tolerated the                        procedure well. The quality of the bowel preparation                        was evaluated using the BBPS [facility] Bowel Preparation                        Scale) with scores of: Right Colon = 3, Transverse                        Colon = 3 and Left Colon = 3 (entire mucosa seen well                         with no residual staining, small fragments of stool or                        opaque liquid). The total BBPS score equals 9. Findings:      Hemorrhoids were found on perianal exam.      Seven sessile polyps were found in the descending colon (1), transverse       colon (3) and ascending colon (3). The polyps were 3 to 7 mm in size.       These polyps were removed with a cold snare. Resection and retrieval       were complete.      A diminutive polyp was found in the transverse colon. The polyp was       sessile. The polyp was removed with a cold biopsy forceps. Resection and       retrieval were complete.      Non-bleeding external hemorrhoids were found during retroflexion. The       hemorrhoids were medium-sized. Impression:           - Hemorrhoids found on perianal exam.                       - Seven 3 to 7 mm polyps in the descending colon, in                        the transverse colon and in the ascending colon,                        removed with a cold snare. Resected and retrieved.                       - One diminutive polyp in the transverse colon, removed                        with a cold biopsy forceps. Resected and retrieved.                       - Non-bleeding  external hemorrhoids. Recommendation:       - Discharge patient to home (with escort).                       - Resume previous diet today.                       - Continue present medications.                       - Await pathology results.                       - Repeat colonoscopy in 3 years for surveillance of                        multiple polyps.                       - Recommend referral to Genetic councellor Procedure Code(s):    --- Professional ---                       [phone], Colonoscopy, flexible; with removal of tumor(s),                        polyp(s), or other lesion(s) by snare technique                       45380, 59, Colonoscopy, flexible; with biopsy, single                         or multiple Diagnosis Code(s):    --- Professional ---                       K63.5, Polyp of colon                       Z86.010, Personal history of colonic polyps                       K64.4, Residual hemorrhoidal skin tags CPT copyright 2019 American Medical Association. All rights reserved. The codes documented in this report are preliminary and upon coder review may  be revised to meet current compliance requirements. Dr. Ulyess Mort Lin Landsman MD, MD 06/23/2019 9:04:21 AM This report has been signed electronically. Number of Addenda: 0 Note Initiated On: 06/23/2019 8:24 AM Scope Withdrawal Time: 0 hours 22 minutes 32 seconds  Total Procedure Duration: 0 hours 25 minutes 9 seconds  Estimated Blood Loss: Estimated blood loss: none.      [facility]

## 2019-06-23 NOTE — Anesthesia Post-op Follow-up Note (Signed)
Anesthesia QCDR form completed.        

## 2019-06-23 NOTE — Transfer of Care (Signed)
Immediate Anesthesia Transfer of Care Note  Patient: Kenneth Hester  Procedure(s) Performed: COLONOSCOPY WITH PROPOFOL (N/A )  Patient Location: PACU  Anesthesia Type:General  Level of Consciousness: awake and alert   Airway & Oxygen Therapy: Patient Spontanous Breathing and Patient connected to face mask oxygen  Post-op Assessment: Report given to RN and Post -op Vital signs reviewed and stable  Post vital signs: Reviewed and stable  Last Vitals:  Vitals Value Taken Time  BP 99/45 06/23/19 0906  Temp 36.4 C 06/23/19 0902  Pulse 71 06/23/19 0907  Resp 21 06/23/19 0907  SpO2 96 % 06/23/19 0907  Vitals shown include unvalidated device data.  Last Pain:  Vitals:   06/23/19 0902  TempSrc: Temporal  PainSc: 0-No pain         Complications: No apparent anesthesia complications

## 2019-06-23 NOTE — Anesthesia Postprocedure Evaluation (Signed)
Anesthesia Post Note  Patient: Kenneth Hester  Procedure(s) Performed: COLONOSCOPY WITH PROPOFOL (N/A )  Patient location during evaluation: Endoscopy Anesthesia Type: General Level of consciousness: awake and alert and oriented Pain management: pain level controlled Vital Signs Assessment: post-procedure vital signs reviewed and stable Respiratory status: spontaneous breathing, nonlabored ventilation and respiratory function stable Cardiovascular status: blood pressure returned to baseline and stable Postop Assessment: no signs of nausea or vomiting Anesthetic complications: no     Last Vitals:  Vitals:   06/23/19 0912 06/23/19 0922  BP: 118/75 131/75  Pulse: 72 65  Resp: 16 16  Temp:    SpO2: 95% 94%    Last Pain:  Vitals:   06/23/19 0922  TempSrc:   PainSc: 0-No pain                 Drue Harr

## 2019-06-23 NOTE — Anesthesia Preprocedure Evaluation (Signed)
Anesthesia Evaluation  Patient identified by MRN, date of birth, ID band Patient awake    Reviewed: Allergy & Precautions, NPO status , Patient's Chart, lab work & pertinent test results  History of Anesthesia Complications Negative for: history of anesthetic complications  Airway Mallampati: III  TM Distance: >3 FB Neck ROM: Full    Dental  (+) Implants   Pulmonary asthma , sleep apnea , former smoker,    breath sounds clear to auscultation- rhonchi (-) wheezing      Cardiovascular hypertension, Pt. on medications (-) CAD, (-) Past MI, (-) Cardiac Stents and (-) CABG  Rhythm:Regular Rate:Normal - Systolic murmurs and - Diastolic murmurs    Neuro/Psych neg Seizures negative neurological ROS  negative psych ROS   GI/Hepatic Neg liver ROS, GERD  ,  Endo/Other  negative endocrine ROSneg diabetes  Renal/GU negative Renal ROS     Musculoskeletal  (+) Arthritis ,   Abdominal (+) + obese,   Peds  Hematology negative hematology ROS (+)   Anesthesia Other Findings Past Medical History: No date: Arthritis No date: Asthma 12/05/2013: Chest pain 12/15/2016: ED (erectile dysfunction) of organic origin No date: Family history of adverse reaction to anesthesia     Comment:  Patients mother went into cardiac arrest twice during               surgery 20 years ago No date: GERD (gastroesophageal reflux disease) No date: Hypertension 12/05/2013: Morbid obesity ([place]) No date: Pneumonia     Comment:  hx of 08/26/2016: Primary localized osteoarthritis of left knee No date: Primary localized osteoarthritis of right knee No date: Sleep apnea     Comment:  wears CPAP nightly No date: Urgency of urination   Reproductive/Obstetrics                             Anesthesia Physical Anesthesia Plan  ASA: III  Anesthesia Plan: General   Post-op Pain Management:    Induction: Intravenous  PONV Risk Score  and Plan: 2 and Propofol infusion  Airway Management Planned: Natural Airway  Additional Equipment:   Intra-op Plan:   Post-operative Plan:   Informed Consent: I have reviewed the patients History and Physical, chart, labs and discussed the procedure including the risks, benefits and alternatives for the proposed anesthesia with the patient or authorized representative who has indicated his/her understanding and acceptance.     Dental advisory given  Plan Discussed with: CRNA and Anesthesiologist  Anesthesia Plan Comments:         Anesthesia Quick Evaluation

## 2019-06-23 NOTE — H&P (Signed)
Cephas Darby, MD [address]  [place]  [place],  [address]  Main: [phone]  Fax: [phone] Pager: [phone]  Primary Care Physician:  Jerrol Banana., MD Primary Gastroenterologist:  Dr. Cephas Darby  Pre-Procedure History & Physical: HPI:  Kenneth Hester is a 68 y.o. male is here for an colonoscopy.   Past Medical History:  Diagnosis Date  . Arthritis   . Asthma   . Chest pain 12/05/2013  . 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 Family history of adverse reaction to anesthesia    Patients mother went into cardiac arrest twice during surgery 20 years ago  . GERD (gastroesophageal reflux disease)   . Hypertension   . Morbid obesity ([place]) 12/05/2013  . Pneumonia    hx of  . Primary localized osteoarthritis of left knee 08/26/2016  . Primary localized osteoarthritis of right knee   . Sleep apnea    wears CPAP nightly  . Urgency of urination     Past Surgical History:  Procedure Laterality Date  . APPENDECTOMY    . COLONOSCOPY  2007  . COLONOSCOPY W/ POLYPECTOMY    . COLONOSCOPY WITH PROPOFOL N/A 07/01/2016   Procedure: COLONOSCOPY WITH PROPOFOL;  Surgeon: Robert Bellow, MD;  Location: [facility] ENDOSCOPY;  Service: Endoscopy;  Laterality: N/A;  . HERNIA REPAIR     68 yrs old and 70 yrs old  . hydrocelectomy    . KNEE SURGERY Right   . SKIN GRAFT    . SKIN SPLIT GRAFT    . 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 TOTAL KNEE ARTHROPLASTY Left 09/07/2016   Procedure: TOTAL KNEE ARTHROPLASTY;  Surgeon: Elsie Saas, MD;  Location: [facility];  Service: Orthopedics;  Laterality: Left;    Prior to Admission medications   Medication Sig Start Date End Date Taking? Authorizing Provider  albuterol (PROVENTIL) (2.5 MG/3ML) 0.083% nebulizer solution Take 3 mLs (2.5 mg total) by nebulization every 4  (four) hours as needed for wheezing or shortness of breath. 10/02/16  Yes Paulette Blanch, MD  fluticasone (FLOVENT HFA) 110 MCG/ACT inhaler INHALE 2 PUFFS INTO THE LUNGS TWICE A DAY 10/27/18  Yes Jerrol Banana., MD  albuterol [facility] HFA) 108 [phone] Base) MCG/ACT inhaler INHALE 2 SPRAYS EVERY 4 HOURS AS NEEDED 06/14/17   Jerrol Banana., MD  hydrochlorothiazide (HYDRODIURIL) 25 MG tablet TAKE 1 TABLET (25 MG TOTAL) BY MOUTH DAILY. 10/27/18   Jerrol Banana., MD  Multiple Vitamins-Minerals (MULTIVITAMIN WITH MINERALS) tablet Take 1 tablet by mouth daily.     [provider]    Allergies as of 05/03/2019 - Review Complete 03/14/2019  Allergen Reaction Noted  . Lisinopril Cough 09/04/2015  . Achromycin [tetracycline] Rash 12/20/2013    Family History  Problem Relation Age of Onset  . Heart attack Mother   . Heart failure Mother   . Diabetes Mother   . Hypertension Mother   . Heart disease Mother   . Heart attack Sister   . Heart disease Sister   . Diabetes Sister   . Heart disease Father   . Cancer Father   . Diabetes Son   . Heart attack Maternal Grandfather   . Heart disease Son     Social History   Socioeconomic History  . Marital status: Married    Spouse name:  barbara  . Number of children: 2  . Years of education: Not on file  . Highest education level: 12th grade  Occupational History  . Occupation: Cabin crew  Social Needs  . Financial resource strain: Not very hard  . Food insecurity    Worry: Never true    Inability: Never true  . Transportation needs    Medical: No    Non-medical: No  Tobacco Use  . Smoking status: Former Smoker    Types: Cigarettes  . Smokeless tobacco: Never Used  . Tobacco comment: > 40 years ago  Substance and Sexual Activity  . Alcohol use: No  . Drug use: No  . Sexual activity: Yes  Lifestyle  . Physical activity    Days per week: 0 days    Minutes per session: 0 min  . Stress: Not at all  Relationships  .  Social Herbalist on phone: Patient refused    Gets together: Patient refused    Attends religious service: Patient refused    Active member of club or organization: Patient refused    Attends meetings of clubs or organizations: Patient refused    Relationship status: Patient refused  . Intimate partner violence    Fear of current or ex partner: No    Emotionally abused: No    Physically abused: No    Forced sexual activity: No  Other Topics Concern  . Not on file  Social History Narrative   Pt has trouble sleeping due to sleep apnea. Pt f/u on this with Dr Alva Garnet.    Review of Systems: See HPI, otherwise negative ROS  Physical Exam: BP (!) 158/85   Pulse 73   Temp (!) 97.5 F (36.4 C) (Tympanic)   Resp 18   Ht 5\' 10"  (1.778 m)   Wt 100.7 kg   SpO2 98%   BMI 31.85 kg/m  General:   Alert,  pleasant and cooperative in NAD Head:  Normocephalic and atraumatic. Neck:  Supple; no masses or thyromegaly. Lungs:  Clear throughout to auscultation.    Heart:  Regular rate and rhythm. Abdomen:  Soft, nontender and nondistended. Normal bowel sounds, without guarding, and without rebound.   Neurologic:  Alert and  oriented x4;  grossly normal neurologically.  Impression/Plan: [name] is here for an colonoscopy to be performed for personal h/o colon polyps  Risks, benefits, limitations, and alternatives regarding  colonoscopy have been reviewed with the patient.  Questions have been answered.  All parties agreeable.   Sherri Sear, MD  06/23/2019, 8:14 AM

## 2019-06-26 ENCOUNTER — Encounter: Payer: Self-pay | Admitting: Gastroenterology

## 2019-06-26 LAB — SURGICAL PATHOLOGY

## 2019-06-30 DIAGNOSIS — E291 Testicular hypofunction: Secondary | ICD-10-CM | POA: Diagnosis not present

## 2019-06-30 DIAGNOSIS — E559 Vitamin D deficiency, unspecified: Secondary | ICD-10-CM | POA: Diagnosis not present

## 2019-06-30 DIAGNOSIS — E785 Hyperlipidemia, unspecified: Secondary | ICD-10-CM | POA: Diagnosis not present

## 2019-06-30 DIAGNOSIS — R972 Elevated prostate specific antigen [PSA]: Secondary | ICD-10-CM | POA: Diagnosis not present

## 2019-09-21 DIAGNOSIS — E559 Vitamin D deficiency, unspecified: Secondary | ICD-10-CM | POA: Diagnosis not present

## 2019-09-21 DIAGNOSIS — E785 Hyperlipidemia, unspecified: Secondary | ICD-10-CM | POA: Diagnosis not present

## 2019-09-21 DIAGNOSIS — R972 Elevated prostate specific antigen [PSA]: Secondary | ICD-10-CM | POA: Diagnosis not present

## 2019-09-21 DIAGNOSIS — E291 Testicular hypofunction: Secondary | ICD-10-CM | POA: Diagnosis not present

## 2019-10-12 NOTE — Progress Notes (Signed)
Subjective:   Kenneth Hester is a 69 y.o. male who presents for Medicare Annual/Subsequent preventive examination.    This visit is being conducted through telemedicine due to the COVID-19 pandemic. This patient has given me verbal consent via doximity to conduct this visit, patient states they are participating from their home address. Some vital signs may be absent or patient reported.    Patient identification: identified by name, DOB, and current address    This visit is being conducted through telemedicine due to the COVID-19 pandemic. This patient has given me verbal consent via doximity to conduct this visit, patient states they are participating from their home address. Some vital signs may be absent or patient reported.    Patient identification: identified by name, DOB, and current address  Review of Systems:  N/A  Cardiac Risk Factors include: advanced age (>55men, >42 women);male gender;hypertension     Objective:    Vitals: There were no vitals taken for this visit.  There is no height or weight on file to calculate BMI. Unable to obtain vitals due to visit being conducted via telephonically.   Advanced Directives 10/16/2019 06/23/2019 10/14/2018 09/01/2017 08/27/2016 08/27/2016 08/27/2016  Does Patient Have a Medical Advance Directive? No No No No No No No  Would patient like information on creating a medical advance directive? No - Patient declined - No - Patient declined No - Patient declined - - Yes (MAU/Ambulatory/Procedural Areas - Information given)    Tobacco Social History   Tobacco Use  Smoking Status Former Smoker  . Types: Cigarettes  Smokeless Tobacco Never Used  Tobacco Comment   > 40 years ago     Counseling given: Not Answered Comment: > 40 years ago   Clinical Intake:  Pre-visit preparation completed: Yes  Pain : No/denies pain Pain Score: 0-No pain     Nutritional Risks: None Diabetes: No  How often do you need to have someone help  you when you read instructions, pamphlets, or other written materials from your doctor or pharmacy?: 1 - Never  Interpreter Needed?: No  Information entered by :: [facility], LPN  Past Medical History:  Diagnosis Date  . Arthritis   . Asthma   . Chest pain 12/05/2013  . 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 Family history of adverse reaction to anesthesia    Patients mother went into cardiac arrest twice during surgery 20 years ago  . GERD (gastroesophageal reflux disease)   . Hypertension   . Morbid obesity ([place]) 12/05/2013  . Pneumonia    hx of  . Primary localized osteoarthritis of left knee 08/26/2016  . Primary localized osteoarthritis of right knee   . Sleep apnea    wears CPAP nightly  . Urgency of urination    Past Surgical History:  Procedure Laterality Date  . APPENDECTOMY    . COLONOSCOPY  2007  . COLONOSCOPY W/ POLYPECTOMY    . COLONOSCOPY WITH PROPOFOL N/A 07/01/2016   Procedure: COLONOSCOPY WITH PROPOFOL;  Surgeon: Robert Bellow, MD;  Location: [facility] ENDOSCOPY;  Service: Endoscopy;  Laterality: N/A;  . COLONOSCOPY WITH PROPOFOL N/A 06/23/2019   Procedure: COLONOSCOPY WITH PROPOFOL;  Surgeon: Lin Landsman, MD;  Location: [facility] ENDOSCOPY;  Service: Gastroenterology;  Laterality: N/A;  . HERNIA REPAIR     69 yrs old and 69 yrs old  . hydrocelectomy    . KNEE SURGERY Right   . SKIN GRAFT    . SKIN SPLIT GRAFT    .  TOTAL KNEE ARTHROPLASTY Right 10/07/2015   Procedure: TOTAL KNEE ARTHROPLASTY;  Surgeon: Elsie Saas, MD;  Location: [place];  Service: Orthopedics;  Laterality: Right;  . TOTAL KNEE ARTHROPLASTY Left 09/07/2016   Procedure: TOTAL KNEE ARTHROPLASTY;  Surgeon: Elsie Saas, MD;  Location: [place];  Service: Orthopedics;  Laterality: Left;   Family History  Problem Relation Age of Onset  . Heart attack Mother   . Heart failure Mother   . Diabetes Mother   . Hypertension Mother   . Heart disease Mother   . Heart attack Sister     . Heart disease Sister   . Diabetes Sister   . Heart disease Father   . Cancer Father   . Diabetes Son   . Heart attack Maternal Grandfather   . Heart disease Son    Social History   Socioeconomic History  . Marital status: Married    Spouse name: Pamala Hurry  . Number of children: 2  . Years of education: Not on file  . Highest education level: 12th grade  Occupational History  . Occupation: realtor  Tobacco Use  . Smoking status: Former Smoker    Types: Cigarettes  . Smokeless tobacco: Never Used  . Tobacco comment: > 40 years ago  Substance and Sexual Activity  . Alcohol use: No  . Drug use: No  . Sexual activity: Yes  Other Topics Concern  . Not on file  Social History Narrative   Pt has trouble sleeping due to sleep apnea. Pt f/u on this with Dr Alva Garnet.   Social Determinants of Health   Financial Resource Strain: Low Risk   . Difficulty of Paying Living Expenses: Not hard at all  Food Insecurity: No Food Insecurity  . Worried About Charity fundraiser in the Last Year: Never true  . Ran Out of Food in the Last Year: Never true  Transportation Needs: No Transportation Needs  . Lack of Transportation (Medical): No  . Lack of Transportation (Non-Medical): No  Physical Activity: Inactive  . Days of Exercise per Week: 0 days  . Minutes of Exercise per Session: 0 min  Stress: No Stress Concern Present  . Feeling of Stress : Not at all  Social Connections: Slightly Isolated  . Frequency of Communication with Friends and Family: More than three times a week  . Frequency of Social Gatherings with Friends and Family: More than three times a week  . Attends Religious Services: More than 4 times per year  . Active Member of Clubs or Organizations: No  . Attends Archivist Meetings: Never  . Marital Status: Married    Outpatient Encounter Medications as of 10/16/2019  Medication Sig  . albuterol (PROAIR HFA) 108 (90 Base) MCG/ACT inhaler INHALE 2 SPRAYS EVERY  4 HOURS AS NEEDED  . albuterol (PROVENTIL) (2.5 MG/3ML) 0.083% nebulizer solution Take 3 mLs (2.5 mg total) by nebulization every 4 (four) hours as needed for wheezing or shortness of breath.  . fluticasone (FLOVENT HFA) 110 MCG/ACT inhaler INHALE 2 PUFFS INTO THE LUNGS TWICE A DAY  . hydrochlorothiazide (HYDRODIURIL) 25 MG tablet TAKE 1 TABLET (25 MG TOTAL) BY MOUTH DAILY.  . Multiple Vitamins-Minerals (MULTIVITAMIN WITH MINERALS) tablet Take 1 tablet by mouth daily.   . Testosterone 30 MG MISC 2 (two) times a week.   No facility-administered encounter medications on file as of 10/16/2019.    Activities of Daily Living In your present state of health, do you have any difficulty performing the following activities:  10/16/2019  Hearing? N  Vision? N  Difficulty concentrating or making decisions? N  Walking or climbing stairs? N  Dressing or bathing? N  Doing errands, shopping? N  Preparing Food and eating ? N  Using the Toilet? N  In the past six months, have you accidently leaked urine? N  Do you have problems with loss of bowel control? N  Managing your Medications? N  Managing your Finances? N  Housekeeping or managing your Housekeeping? N  Some recent data might be hidden    Patient Care Team: Jerrol Banana., MD as PCP - General (Family Medicine) Byrnett, [name], MD (General Surgery) Elsie Saas, MD as Consulting Physician (Orthopedic Surgery) Lorelee Cover., MD as Consulting Physician (Ophthalmology)   Assessment:   This is a routine wellness examination for [place].  Exercise Activities and Dietary recommendations Current Exercise Habits: The patient does not participate in regular exercise at present, Exercise limited by: None identified  Goals    . Exercise 150 minutes a week     Recommend starting back exercising for at least 150 minutes a week.     . Increase water intake     Starting 08/27/16, I will increase my water intake to 4-5 glasses a day.     Marland Kitchen LIFESTYLE - DECREASE FALLS RISK     Recommend to remove any items from the home that may cause slips or trips.       Fall Risk: Fall Risk  10/16/2019 10/14/2018 09/01/2017 08/27/2016 09/04/2015  Falls in the past year? 1 0 Yes No No  Number falls in past yr: 1 - 1 - -  Comment - - missed a step - -  Injury with Fall? 0 - No - -  Follow up Falls prevention discussed - Falls prevention discussed - -    FALL RISK PREVENTION PERTAINING TO THE HOME:  Any stairs in or around the home? Yes  If so, are there any without handrails? No   Home free of loose throw rugs in walkways, pet beds, electrical cords, etc? Yes  Adequate lighting in your home to reduce risk of falls? Yes   ASSISTIVE DEVICES UTILIZED TO PREVENT FALLS:  Life alert? No  Use of a cane, walker or w/c? No  Grab bars in the bathroom? No  Shower chair or bench in shower? No  Elevated toilet seat or a handicapped toilet? No   TIMED UP AND GO:  Was the test performed? No .    Depression Screen PHQ 2/9 Scores 10/16/2019 10/14/2018 10/14/2018 09/01/2017  PHQ - 2 Score 0 0 0 0  PHQ- 9 Score - 0 - 6    Cognitive Function: Declined today.      6CIT Screen 10/14/2018 08/27/2016  What Year? 0 points 0 points  What month? 0 points 0 points  What time? 0 points 0 points  Count back from 20 0 points 0 points  Months in reverse 0 points 0 points  Repeat phrase 0 points 4 points  Total Score 0 4    Immunization History  Administered Date(s) Administered  . Pneumococcal Conjugate-13 07/27/2014  . Tdap 07/27/2014    Qualifies for Shingles Vaccine? Yes . Due for Shingrix. Pt has been advised to call insurance company to determine out of pocket expense. Advised may also receive vaccine at local pharmacy or Health Dept. Verbalized acceptance and understanding.  Tdap: Up to date  Flu Vaccine: Due for Flu vaccine. Does the patient want to receive this vaccine  today?  No . Advised may receive this vaccine at local pharmacy or Health  Dept. Aware to provide a copy of the vaccination record if obtained from local pharmacy or Health Dept. Verbalized acceptance and understanding.  Pneumococcal Vaccine: Due for Pneumococcal vaccine. Does the patient want to receive this vaccine today?  No . Advised may receive this vaccine at local pharmacy or Health Dept. Aware to provide a copy of the vaccination record if obtained from local pharmacy or Health Dept. Verbalized acceptance and understanding.   Screening Tests Health Maintenance  Topic Date Due  . INFLUENZA VACCINE  11/22/2019 (Originally 03/25/2019)  . PNA vac Low Risk Adult (2 of 2 - PPSV23) 10/15/2020 (Originally 07/28/2015)  . COLONOSCOPY  06/22/2022  . TETANUS/TDAP  07/27/2024  . Hepatitis C Screening  Completed   Cancer Screenings:  Colorectal Screening: Completed 06/23/19. Repeat every 3 years.   Lung Cancer Screening: (Low Dose CT Chest recommended if Age 16-80 years, 30 pack-year currently smoking OR have quit w/in 15years.) does not qualify.   Additional Screening:  Hepatitis C Screening: Up to date  Vision Screening: Recommended annual ophthalmology exams for early detection of glaucoma and other disorders of the eye.  Dental Screening: Recommended annual dental exams for proper oral hygiene  Community Resource Referral:  CRR required this visit?  No        Plan:  I have personally reviewed and addressed the Medicare Annual Wellness questionnaire and have noted the following in the patient's chart:  A. Medical and social history B. Use of alcohol, tobacco or illicit drugs  C. Current medications and supplements D. Functional ability and status E.  Nutritional status F.  Physical activity G. Advance directives H. List of other physicians I.  Hospitalizations, surgeries, and ER visits in previous 12 months J.  [place] such as hearing and vision if needed, cognitive and depression L. Referrals and appointments   In addition, I have  reviewed and discussed with patient certain preventive protocols, quality metrics, and best practice recommendations. A written personalized care plan for preventive services as well as general preventive health recommendations were provided to patient.   [place] Score, [place]  [id number] Nurse Health Advisor   Nurse Notes: Pt declined receiving a future flu or pneumonia vaccine.

## 2019-10-16 ENCOUNTER — Telehealth: Payer: Self-pay

## 2019-10-16 ENCOUNTER — Ambulatory Visit (INDEPENDENT_AMBULATORY_CARE_PROVIDER_SITE_OTHER): Payer: PPO

## 2019-10-16 ENCOUNTER — Other Ambulatory Visit: Payer: Self-pay

## 2019-10-16 DIAGNOSIS — Z0000 Encounter for general adult medical examination without abnormal findings: Secondary | ICD-10-CM | POA: Diagnosis not present

## 2019-10-16 NOTE — Patient Instructions (Signed)
Kenneth Hester , Thank you for taking time to come for your Medicare Wellness Visit. I appreciate your ongoing commitment to your health goals. Please review the following plan we discussed and let me know if I can assist you in the future.   Screening recommendations/referrals: Colonoscopy: Up to date, due 05/2022 Recommended yearly ophthalmology/optometry visit for glaucoma screening and checkup Recommended yearly dental visit for hygiene and checkup  Vaccinations: Influenza vaccine: Pt declines today.  Pneumococcal vaccine: Pt declines today.  Tdap vaccine: Up to date, due 07/2024 Shingles vaccine: Pt declines today.     Advanced directives: Advance directive discussed with you today. Even though you declined this today please call our office should you change your mind and we can give you the proper paperwork for you to fill out.  Conditions/risks identified: Fall risk prevention discussed today. Recommend to start to exercising 3 days a week for at least 30 minutes at a time. Also recommend to drink 6-8 8 oz glasses of water a day.  Next appointment: 10/17/19 @ 10:40 AM with [name].   Preventive Care 2 Years and Older, Male Preventive care refers to lifestyle choices and visits with your health care provider that can promote health and wellness. What does preventive care include?  A yearly physical exam. This is also called an annual well check.  Dental exams once or twice a year.  Routine eye exams. Ask your health care provider how often you should have your eyes checked.  Personal lifestyle choices, including:  Daily care of your teeth and gums.  Regular physical activity.  Eating a healthy diet.  Avoiding tobacco and drug use.  Limiting alcohol use.  Practicing safe sex.  Taking low doses of aspirin every day.  Taking vitamin and mineral supplements as recommended by your health care provider. What happens during an annual well check? The services and  screenings done by your health care provider during your annual well check will depend on your age, overall health, lifestyle risk factors, and family history of disease. Counseling  Your health care provider may ask you questions about your:  Alcohol use.  Tobacco use.  Drug use.  Emotional well-being.  Home and relationship well-being.  Sexual activity.  Eating habits.  History of falls.  Memory and ability to understand (cognition).  Work and work Statistician. Screening  You may have the following tests or measurements:  Height, weight, and BMI.  Blood pressure.  Lipid and cholesterol levels. These may be checked every 5 years, or more frequently if you are over 77 years old.  Skin check.  Lung cancer screening. You may have this screening every year starting at age 84 if you have a 30-pack-year history of smoking and currently smoke or have quit within the past 15 years.  Fecal occult blood test (FOBT) of the stool. You may have this test every year starting at age 58.  Flexible sigmoidoscopy or colonoscopy. You may have a sigmoidoscopy every 5 years or a colonoscopy every 10 years starting at age 36.  Prostate cancer screening. Recommendations will vary depending on your family history and other risks.  Hepatitis C blood test.  Hepatitis B blood test.  Sexually transmitted disease (STD) testing.  Diabetes screening. This is done by checking your blood sugar (glucose) after you have not eaten for a while (fasting). You may have this done every 1-3 years.  Abdominal aortic aneurysm (AAA) screening. You may need this if you are a current or former smoker.  Osteoporosis.  You may be screened starting at age 65 if you are at high risk. Talk with your health care provider about your test results, treatment options, and if necessary, the need for more tests. Vaccines  Your health care provider may recommend certain vaccines, such as:  Influenza vaccine. This is  recommended every year.  Tetanus, diphtheria, and acellular pertussis (Tdap, Td) vaccine. You may need a Td booster every 10 years.  Zoster vaccine. You may need this after age 13.  Pneumococcal 13-valent conjugate (PCV13) vaccine. One dose is recommended after age 71.  Pneumococcal polysaccharide (PPSV23) vaccine. One dose is recommended after age 64. Talk to your health care provider about which screenings and vaccines you need and how often you need them. This information is not intended to replace advice given to you by your health care provider. Make sure you discuss any questions you have with your health care provider. Document Released: 09/06/2015 Document Revised: 04/29/2016 Document Reviewed: 06/11/2015 Elsevier Interactive Patient Education  2017 [place] Prevention in the Home Falls can cause injuries. They can happen to people of all ages. There are many things you can do to make your home safe and to help prevent falls. What can I do on the outside of my home?  Regularly fix the edges of walkways and driveways and fix any cracks.  Remove anything that might make you trip as you walk through a door, such as a raised step or threshold.  Trim any bushes or trees on the path to your home.  Use bright outdoor lighting.  Clear any walking paths of anything that might make someone trip, such as rocks or tools.  Regularly check to see if handrails are loose or broken. Make sure that both sides of any steps have handrails.  Any raised decks and porches should have guardrails on the edges.  Have any leaves, snow, or ice cleared regularly.  Use sand or salt on walking paths during winter.  Clean up any spills in your garage right away. This includes oil or grease spills. What can I do in the bathroom?  Use night lights.  Install grab bars by the toilet and in the tub and shower. Do not use towel bars as grab bars.  Use non-skid mats or decals in the tub or  shower.  If you need to sit down in the shower, use a plastic, non-slip stool.  Keep the floor dry. Clean up any water that spills on the floor as soon as it happens.  Remove soap buildup in the tub or shower regularly.  Attach bath mats securely with double-sided non-slip rug tape.  Do not have throw rugs and other things on the floor that can make you trip. What can I do in the bedroom?  Use night lights.  Make sure that you have a light by your bed that is easy to reach.  Do not use any sheets or blankets that are too big for your bed. They should not hang down onto the floor.  Have a firm chair that has side arms. You can use this for support while you get dressed.  Do not have throw rugs and other things on the floor that can make you trip. What can I do in the kitchen?  Clean up any spills right away.  Avoid walking on wet floors.  Keep items that you use a lot in easy-to-reach places.  If you need to reach something above you, use a strong step stool  that has a grab bar.  Keep electrical cords out of the way.  Do not use floor polish or wax that makes floors slippery. If you must use wax, use non-skid floor wax.  Do not have throw rugs and other things on the floor that can make you trip. What can I do with my stairs?  Do not leave any items on the stairs.  Make sure that there are handrails on both sides of the stairs and use them. Fix handrails that are broken or loose. Make sure that handrails are as long as the stairways.  Check any carpeting to make sure that it is firmly attached to the stairs. Fix any carpet that is loose or worn.  Avoid having throw rugs at the top or bottom of the stairs. If you do have throw rugs, attach them to the floor with carpet tape.  Make sure that you have a light switch at the top of the stairs and the bottom of the stairs. If you do not have them, ask someone to add them for you. What else can I do to help prevent  falls?  Wear shoes that:  Do not have high heels.  Have rubber bottoms.  Are comfortable and fit you well.  Are closed at the toe. Do not wear sandals.  If you use a stepladder:  Make sure that it is fully opened. Do not climb a closed stepladder.  Make sure that both sides of the stepladder are locked into place.  Ask someone to hold it for you, if possible.  Clearly mark and make sure that you can see:  Any grab bars or handrails.  First and last steps.  Where the edge of each step is.  Use tools that help you move around (mobility aids) if they are needed. These include:  Canes.  Walkers.  Scooters.  Crutches.  Turn on the lights when you go into a dark area. Replace any light bulbs as soon as they burn out.  Set up your furniture so you have a clear path. Avoid moving your furniture around.  If any of your floors are uneven, fix them.  If there are any pets around you, be aware of where they are.  Review your medicines with your doctor. Some medicines can make you feel dizzy. This can increase your chance of falling. Ask your doctor what other things that you can do to help prevent falls. This information is not intended to replace advice given to you by your health care provider. Make sure you discuss any questions you have with your health care provider. Document Released: 06/06/2009 Document Revised: 01/16/2016 Document Reviewed: 09/14/2014 Elsevier Interactive Patient Education  2017 [employer].

## 2019-10-16 NOTE — Progress Notes (Signed)
Patient: Kenneth Hester, Male    DOB: 05/08/1951, 69 y.o.   MRN: [id number] Visit Date: 10/17/2019  Today's Provider: Wilhemena Durie, MD   Chief Complaint  Patient presents with  . Annual Exam   Subjective:     Patient had AWV with [facility] on 10/16/2019.   Complete Physical Kenneth Hester is a 69 y.o. male. He feels well. He reports exercising/some. He reports he is sleeping fairly well/CPAP.  -----------------------------------------------------------  Colonoscopy: 06/23/2019  Review of Systems  Constitutional: Negative.   HENT: Negative.   Eyes: Negative.   Respiratory: Positive for apnea.   Cardiovascular: Negative.   Gastrointestinal: Negative.   Endocrine: Negative.   Musculoskeletal: Negative.   Allergic/Immunologic: Negative.   Neurological: Positive for dizziness.  Hematological: Negative.   Psychiatric/Behavioral: Negative.     Social History   Socioeconomic History  . Marital status: Married    Spouse name: Pamala Hurry  . Number of children: 2  . Years of education: Not on file  . Highest education level: 12th grade  Occupational History  . Occupation: realtor  Tobacco Use  . Smoking status: Former Smoker    Types: Cigarettes  . Smokeless tobacco: Never Used  . Tobacco comment: > 40 years ago  Substance and Sexual Activity  . Alcohol use: No  . Drug use: No  . Sexual activity: Yes  Other Topics Concern  . Not on file  Social History Narrative   Pt has trouble sleeping due to sleep apnea. Pt f/u on this with Dr Alva Garnet.   Social Determinants of Health   Financial Resource Strain: Low Risk   . Difficulty of Paying Living Expenses: Not hard at all  Food Insecurity: No Food Insecurity  . Worried About Charity fundraiser in the Last Year: Never true  . Ran Out of Food in the Last Year: Never true  Transportation Needs: No Transportation Needs  . Lack of Transportation (Medical): No  . Lack of Transportation (Non-Medical): No   Physical Activity: Inactive  . Days of Exercise per Week: 0 days  . Minutes of Exercise per Session: 0 min  Stress: No Stress Concern Present  . Feeling of Stress : Not at all  Social Connections: Slightly Isolated  . Frequency of Communication with Friends and Family: More than three times a week  . Frequency of Social Gatherings with Friends and Family: More than three times a week  . Attends Religious Services: More than 4 times per year  . Active Member of Clubs or Organizations: No  . Attends Archivist Meetings: Never  . Marital Status: Married  Human resources officer Violence: Not At Risk  . Fear of Current or Ex-Partner: No  . Emotionally Abused: No  . Physically Abused: No  . Sexually Abused: No    Past Medical History:  Diagnosis Date  . Arthritis   . Asthma   . Chest pain 12/05/2013  . 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 Family history of adverse reaction to anesthesia    Patients mother went into cardiac arrest twice during surgery 20 years ago  . GERD (gastroesophageal reflux disease)   . Hypertension   . Morbid obesity ([place]) 12/05/2013  . Pneumonia    hx of  . Primary localized osteoarthritis of left knee 08/26/2016  . Primary localized osteoarthritis of right knee   . Sleep apnea    wears CPAP nightly  . Urgency of urination  Patient Active Problem List   Diagnosis Date Noted  . History of colonic polyps   . 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 Primary localized osteoarthritis of left knee 08/26/2016  . Umbilical hernia without obstruction and without gangrene 05/26/2016  . Encounter for screening colonoscopy 12/03/2015  . Primary localized osteoarthritis of right knee   . Hypertension   . Chest pain 12/05/2013  . Sleep apnea 12/05/2013  . Morbid obesity ([place]) 12/05/2013  . GERD (gastroesophageal reflux disease) 12/05/2013  . Asthma 12/05/2013    Past Surgical History:  Procedure Laterality Date  .  APPENDECTOMY    . COLONOSCOPY  2007  . COLONOSCOPY W/ POLYPECTOMY    . COLONOSCOPY WITH PROPOFOL N/A 07/01/2016   Procedure: COLONOSCOPY WITH PROPOFOL;  Surgeon: Robert Bellow, MD;  Location: [facility] ENDOSCOPY;  Service: Endoscopy;  Laterality: N/A;  . COLONOSCOPY WITH PROPOFOL N/A 06/23/2019   Procedure: COLONOSCOPY WITH PROPOFOL;  Surgeon: Lin Landsman, MD;  Location[facility] ENDOSCOPY;  Service: Gastroenterology;  Laterality: N/A;  . HERNIA REPAIR     69 yrs old and 69 yrs old  . hydrocelectomy    . KNEE SURGERY Right   . SKIN GRAFT    . SKIN SPLIT GRAFT    . TOTAL KNEE ARTHROPLASTY Right 10/07/2015   Procedure: TOTAL KNEE ARTHROPLASTY;  Surgeon: Elsie Saas, MD;  Location: [place];  Service: Orthopedics;  Laterality: Right;  . TOTAL KNEE ARTHROPLASTY Left 09/07/2016   Procedure: TOTAL KNEE ARTHROPLASTY;  Surgeon: Elsie Saas, MD;  Location: [place];  Service: Orthopedics;  Laterality: Left;    His family history includes Cancer in his father; Diabetes in his mother, sister, and son; Heart attack in his maternal grandfather, mother, and sister; Heart disease in his father, mother, sister, and son; Heart failure in his mother; Hypertension in his mother.   Current Outpatient Medications:  .  albuterol (PROAIR HFA) 108 (90 Base) MCG/ACT inhaler, INHALE 2 SPRAYS EVERY 4 HOURS AS NEEDED, Disp: 3 Inhaler, Rfl: 11 .  albuterol (PROVENTIL) (2.5 MG/3ML) 0.083% nebulizer solution, Take 3 mLs (2.5 mg total) by nebulization every 4 (four) hours as needed for wheezing or shortness of breath., Disp: 75 mL, Rfl: 0 .  fluticasone (FLOVENT HFA) 110 MCG/ACT inhaler, INHALE 2 PUFFS INTO THE LUNGS TWICE A DAY, Disp: 36 Inhaler, Rfl: 3 .  hydrochlorothiazide (HYDRODIURIL) 25 MG tablet, TAKE 1 TABLET (25 MG TOTAL) BY MOUTH DAILY., Disp: 90 tablet, Rfl: 3 .  Multiple Vitamins-Minerals (MULTIVITAMIN WITH MINERALS) tablet, Take 1 tablet by mouth daily. , Disp: , Rfl:  .  Testosterone 30 MG MISC, 2 (two)  times a week., Disp: , Rfl:   Patient Care Team: Jerrol Banana., MD as PCP - General (Family Medicine) Bary Castilla, [name], MD (General Surgery) Elsie Saas, MD as Consulting Physician (Orthopedic Surgery) Lorelee Cover., MD as Consulting Physician (Ophthalmology)     Objective:    Vitals: BP (!) 143/76 (BP Location: Right Arm, Patient Position: Sitting, Cuff Size: Large)   Pulse 76   Temp (!) 97.5 F (36.4 C) (Other (Comment))   Resp 18   Ht 5\' 8"  (1.727 m)   Wt 235 lb (106.6 kg)   SpO2 95%   BMI 35.73 kg/m   Physical Exam Vitals reviewed.  Constitutional:      Appearance: He is well-developed. He is obese.  HENT:     Head: Normocephalic and atraumatic.     Right Ear: External ear normal.  Left Ear: External ear normal.     Nose: Nose normal.  Eyes:     General: No scleral icterus.    Conjunctiva/sclera: Conjunctivae normal.  Neck:     Thyroid: No thyromegaly.  Cardiovascular:     Rate and Rhythm: Normal rate and regular rhythm.     Heart sounds: Normal heart sounds.  Pulmonary:     Effort: Pulmonary effort is normal.     Breath sounds: Normal breath sounds.  Abdominal:     General: Bowel sounds are normal.     Palpations: Abdomen is soft.     Comments: Small/moderate umbilical hernia present.  Genitourinary:    Penis: Normal.      Testes: Normal.     Prostate: Normal.     Rectum: Normal.     Comments: Probable right hydrocele. Musculoskeletal:     Right lower leg: No edema.     Left lower leg: No edema.  Skin:    General: Skin is warm and dry.  Neurological:     General: No focal deficit present.     Mental Status: He is alert and oriented to person, place, and time. Mental status is at baseline.  Psychiatric:        Mood and Affect: Mood normal.        Behavior: Behavior normal.        Thought Content: Thought content normal.        Judgment: Judgment normal.     Activities of Daily Living In your present state of health, do you  have any difficulty performing the following activities: 10/16/2019  Hearing? N  Vision? N  Difficulty concentrating or making decisions? N  Walking or climbing stairs? N  Dressing or bathing? N  Doing errands, shopping? N  Preparing Food and eating ? N  Using the Toilet? N  In the past six months, have you accidently leaked urine? N  Do you have problems with loss of bowel control? N  Managing your Medications? N  Managing your Finances? N  Housekeeping or managing your Housekeeping? N  Some recent data might be hidden    Fall Risk Assessment Fall Risk  10/16/2019 10/14/2018 09/01/2017 08/27/2016 09/04/2015  Falls in the past year? 1 0 Yes No No  Number falls in past yr: 1 - 1 - -  Comment - - missed a step - -  Injury with Fall? 0 - No - -  Follow up Falls prevention discussed - Falls prevention discussed - -     Depression Screen PHQ 2/9 Scores 10/16/2019 10/14/2018 10/14/2018 09/01/2017  PHQ - 2 Score 0 0 0 0  PHQ- 9 Score - 0 - 6    6CIT Screen 10/14/2018  What Year? 0 points  What month? 0 points  What time? 0 points  Count back from 20 0 points  Months in reverse 0 points  Repeat phrase 0 points  Total Score 0       Assessment & Plan:    Annual Physical Reviewed patient's Family Medical History Reviewed and updated list of patient's medical providers Assessment of cognitive impairment was done Assessed patient's functional ability Established a written schedule for health screening [place] Completed and Reviewed  Exercise Activities and Dietary recommendations Goals    . Exercise 150 minutes a week     Recommend starting back exercising for at least 150 minutes a week.     . Increase water intake     Starting 08/27/16, I will  increase my water intake to 4-5 glasses a day.    Marland Kitchen LIFESTYLE - DECREASE FALLS RISK     Recommend to remove any items from the home that may cause slips or trips.       Immunization History  Administered Date(s)  Administered  . Pneumococcal Conjugate-13 07/27/2014  . Tdap 07/27/2014    Health Maintenance  Topic Date Due  . INFLUENZA VACCINE  11/22/2019 (Originally 03/25/2019)  . PNA vac Low Risk Adult (2 of 2 - PPSV23) 10/15/2020 (Originally 07/28/2015)  . COLONOSCOPY  06/22/2022  . TETANUS/TDAP  07/27/2024  . Hepatitis C Screening  Completed     Discussed health benefits of physical activity, and encouraged him to engage in regular exercise appropriate for his age and condition.    --------------------------------------------------------------------  1. Encounter for annual physical exam Diet and exercise with weight loss is stressed for patient overall health. - Lipid panel - Comprehensive Metabolic Panel (CMET) - CBC w/Diff/Platelet - TSH - PSA  2. Essential hypertension  - Lipid panel - Comprehensive Metabolic Panel (CMET) - CBC w/Diff/Platelet - TSH  3. Hyperlipidemia, unspecified hyperlipidemia type  - Lipid panel - Comprehensive Metabolic Panel (CMET) - CBC w/Diff/Platelet - TSH  4. Mild intermittent asthma with acute exacerbation  - Lipid panel - Comprehensive Metabolic Panel (CMET) - CBC w/Diff/Platelet - TSH - albuterol (PROAIR HFA) 108 (90 Base) MCG/ACT inhaler; INHALE 2 SPRAYS EVERY 4 HOURS AS NEEDED  Dispense: 18 g; Refill: 3  5. Screening for prostate cancer  - Lipid panel - Comprehensive Metabolic Panel (CMET) - CBC w/Diff/Platelet - TSH - PSA  6. Umbilical hernia without obstruction and without gangrene Referred to Dr. Arsenio Katz - Ambulatory referral to General Surgery 7.OSA On nightly CPAP.  Follow up in one year for CPE.     I,Juletta Berhe,acting as a scribe for Wilhemena Durie, MD.,have documented all relevant documentation on the behalf of Wilhemena Durie, MD,as directed by  Wilhemena Durie, MD while in the presence of Wilhemena Durie, MD.    Wilhemena Durie, MD  [facility]

## 2019-10-16 NOTE — Telephone Encounter (Signed)
Copied from [place] [id number]. Topic: Clinical - COVID Pre-Screen >> Oct 16, 2019  5:21 PM Erick Blinks wrote: 1. To the best of your knowledge, have you been in close contact with anyone with a confirmed diagnosis of COVID 19?  no  If no - Proceed to next question; If yes - Schedule patient for a virtual visit  2. Have you had any one or more of the following: fever, chills, cough, shortness of breath or any flu-like symptoms?  no  If no - Proceed to next question; If yes - Schedule patient for a virtual visit  3. Have you been diagnosed with or have a previous diagnosis of COVID 19?  no  If no - Proceed to next question; If yes - Schedule patient for a virtual visit  4. I am going to go over a few other symptoms with you. Please let me know if you are experiencing any of the following: no  Ear, nose or throat discomfort  A sore throat  Headache  Muscle pain  Diarrhea  Loss of taste or smell  If no - Continue with scheduling process; If yes - Document in scheduling notes   Thank you for answering these questions. Please know we will ask you these questions or similar questions when you arrive for your appointment and again it's how we are keeping everyone safe. Also, to keep you safe, please use the provided hand sanitizer when you enter the building. [name], we are asking everyone in the building to wear a mask because they help [place] prevent the spread of germs.   Do you have a mask of your own, if not, we are happy to provide one for you. The last thing I want to go over with you is the no visitor guidelines. This means no one can attend the appointment with you unless you need physical assistance. I understand this may be different from your past appointments and I know this may be difficult but please know if someone is driving you we are happy to call them for you once your appointment is over.

## 2019-10-17 ENCOUNTER — Ambulatory Visit (INDEPENDENT_AMBULATORY_CARE_PROVIDER_SITE_OTHER): Payer: PPO | Admitting: Family Medicine

## 2019-10-17 ENCOUNTER — Other Ambulatory Visit: Payer: Self-pay

## 2019-10-17 ENCOUNTER — Encounter: Payer: Self-pay | Admitting: Family Medicine

## 2019-10-17 ENCOUNTER — Ambulatory Visit: Payer: PPO

## 2019-10-17 VITALS — BP 143/76 | HR 76 | Temp 97.5°F | Resp 18 | Ht 68.0 in | Wt 235.0 lb

## 2019-10-17 DIAGNOSIS — Z0000 Encounter for general adult medical examination without abnormal findings: Secondary | ICD-10-CM

## 2019-10-17 DIAGNOSIS — K429 Umbilical hernia without obstruction or gangrene: Secondary | ICD-10-CM

## 2019-10-17 DIAGNOSIS — J4521 Mild intermittent asthma with (acute) exacerbation: Secondary | ICD-10-CM

## 2019-10-17 DIAGNOSIS — I10 Essential (primary) hypertension: Secondary | ICD-10-CM | POA: Diagnosis not present

## 2019-10-17 DIAGNOSIS — G4733 Obstructive sleep apnea (adult) (pediatric): Secondary | ICD-10-CM | POA: Diagnosis not present

## 2019-10-17 DIAGNOSIS — Z125 Encounter for screening for malignant neoplasm of prostate: Secondary | ICD-10-CM

## 2019-10-17 DIAGNOSIS — E785 Hyperlipidemia, unspecified: Secondary | ICD-10-CM | POA: Diagnosis not present

## 2019-10-17 MED ORDER — ALBUTEROL SULFATE HFA 108 (90 BASE) MCG/ACT IN AERS: INHALATION_SPRAY | RESPIRATORY_TRACT | 3 refills | Status: DC

## 2019-10-18 LAB — CBC WITH DIFFERENTIAL/PLATELET
Basophils Absolute: 0.1 10*3/uL (ref 0.0–0.2)
Basos: 1 %
EOS (ABSOLUTE): 0.3 10*3/uL (ref 0.0–0.4)
Eos: 3 %
Hematocrit: 52.5 % — ABNORMAL HIGH (ref 37.5–51.0)
Hemoglobin: 19.2 g/dL — ABNORMAL HIGH (ref 13.0–17.7)
Immature Grans (Abs): 0 10*3/uL (ref 0.0–0.1)
Immature Granulocytes: 0 %
Lymphocytes Absolute: 2.5 10*3/uL (ref 0.7–3.1)
Lymphs: 26 %
MCH: 34.2 pg — ABNORMAL HIGH (ref 26.6–33.0)
MCHC: 36.6 g/dL — ABNORMAL HIGH (ref 31.5–35.7)
MCV: 93 fL (ref 79–97)
Monocytes Absolute: 0.9 10*3/uL (ref 0.1–0.9)
Monocytes: 9 %
Neutrophils Absolute: 5.7 10*3/uL (ref 1.4–7.0)
Neutrophils: 61 %
Platelets: 164 10*3/uL (ref 150–450)
RBC: 5.62 x10E6/uL (ref 4.14–5.80)
RDW: 11.9 % (ref 11.6–15.4)
WBC: 9.5 10*3/uL (ref 3.4–10.8)

## 2019-10-18 LAB — COMPREHENSIVE METABOLIC PANEL
ALT: 23 IU/L (ref 0–44)
AST: 26 IU/L (ref 0–40)
Albumin/Globulin Ratio: 2.1 (ref 1.2–2.2)
Albumin: 4.7 g/dL (ref 3.8–4.8)
Alkaline Phosphatase: 53 IU/L (ref 39–117)
BUN/Creatinine Ratio: 13 (ref 10–24)
BUN: 18 mg/dL (ref 8–27)
Bilirubin Total: 0.5 mg/dL (ref 0.0–1.2)
CO2: 20 mmol/L (ref 20–29)
Calcium: 9.5 mg/dL (ref 8.6–10.2)
Chloride: 99 mmol/L (ref 96–106)
Creatinine, Ser: 1.38 mg/dL — ABNORMAL HIGH (ref 0.76–1.27)
GFR calc Af Amer: 59 mL/min/{1.73_m2} — ABNORMAL LOW (ref >59)
GFR calc non Af Amer: 51 mL/min/{1.73_m2} — ABNORMAL LOW (ref >59)
Globulin, Total: 2.2 g/dL (ref 1.5–4.5)
Glucose: 91 mg/dL (ref 65–99)
Potassium: 4.1 mmol/L (ref 3.5–5.2)
Sodium: 138 mmol/L (ref 134–144)
Total Protein: 6.9 g/dL (ref 6.0–8.5)

## 2019-10-18 LAB — LIPID PANEL
Chol/HDL Ratio: 5.6 ratio — ABNORMAL HIGH (ref 0.0–5.0)
Cholesterol, Total: 208 mg/dL — ABNORMAL HIGH (ref 100–199)
HDL: 37 mg/dL — ABNORMAL LOW (ref >39)
LDL Chol Calc (NIH): 136 mg/dL — ABNORMAL HIGH (ref 0–99)
Triglycerides: 196 mg/dL — ABNORMAL HIGH (ref 0–149)
VLDL Cholesterol Cal: 35 mg/dL (ref 5–40)

## 2019-10-18 LAB — PSA: Prostate Specific Ag, Serum: 0.9 ng/mL (ref 0.0–4.0)

## 2019-10-18 LAB — TSH: TSH: 1.95 u[IU]/mL (ref 0.450–4.500)

## 2019-10-23 DIAGNOSIS — N289 Disorder of kidney and ureter, unspecified: Secondary | ICD-10-CM

## 2019-10-23 HISTORY — DX: Disorder of kidney and ureter, unspecified: N28.9

## 2019-10-29 ENCOUNTER — Other Ambulatory Visit: Payer: Self-pay | Admitting: Family Medicine

## 2019-11-08 ENCOUNTER — Other Ambulatory Visit: Payer: Self-pay | Admitting: Family Medicine

## 2019-11-14 ENCOUNTER — Encounter: Payer: Self-pay | Admitting: Family Medicine

## 2019-11-16 DIAGNOSIS — K429 Umbilical hernia without obstruction or gangrene: Secondary | ICD-10-CM | POA: Diagnosis not present

## 2019-11-19 ENCOUNTER — Other Ambulatory Visit: Payer: Self-pay | Admitting: General Surgery

## 2019-11-21 ENCOUNTER — Other Ambulatory Visit: Payer: Self-pay | Admitting: General Surgery

## 2019-12-07 DIAGNOSIS — E291 Testicular hypofunction: Secondary | ICD-10-CM | POA: Diagnosis not present

## 2019-12-12 NOTE — Progress Notes (Signed)
Established patient visit    Patient: Kenneth Hester   DOB: 1951/01/10   69 y.o. Male  MRN: [id number] Visit Date: 12/18/2019  Today's healthcare provider: Wilhemena Durie, MD   Chief Complaint  Patient presents with  . Hypertension  . Hyperlipidemia   Subjective    HPI  Patient was also found to be polycythemic on last lab work-up along with mild renal insufficiency.  He has been getting testosterone injections twice a week through a private clinic.  He is taking his last dose today.  He and his wife agree it is not something they wish to continue. Hypertension, follow-up  BP Readings from Last 3 Encounters:  12/18/19 (!) 149/80  10/17/19 (!) 143/76  06/23/19 131/75   He was last seen for hypertension 2 months ago.  BP at that visit was 143/76. Management since that visit includes; labs checked, no changes. He reports excellent compliance with treatment. He is not having side effects.   He is exercising. He is not adherent to low salt diet.   Outside blood pressures are good.  He does not smoke.  Use of agents associated with hypertension: none.   ----------------------------------------------------------------------------------------- Lipid/Cholesterol, follow-up  Last Lipid Panel: Lab Results  Component Value Date   CHOL 208 (H) 10/17/2019   LDLCALC 136 (H) 10/17/2019   HDL 37 (L) 10/17/2019   TRIG 196 (H) 10/17/2019   ALT 23 10/17/2019   AST 26 10/17/2019   PLT 164 10/17/2019    He was last seen for this 2 months ago.  Management since that visit includes; labs checked, Mild lipid elevation. Advised to work on diet and exercise.  He reports good compliance with treatment. He is not having side effects.   He is following a Regular diet. Current exercise: walking  Wt Readings from Last 3 Encounters:  12/18/19 233 lb 9.6 oz (106 kg)  10/17/19 235 lb (106.6 kg)  06/23/19 222 lb (100.7 kg)   Last metabolic panel Lab Results  Component Value  Date   GLUCOSE 91 10/17/2019   NA 138 10/17/2019   K 4.1 10/17/2019   BUN 18 10/17/2019   CREATININE 1.38 (H) 10/17/2019   GFRNONAA 51 (L) 10/17/2019   GFRAA 59 (L) 10/17/2019   CALCIUM 9.5 10/17/2019   AST 26 10/17/2019   ALT 23 10/17/2019   The 10-year ASCVD risk score Mikey Bussing DC Jr., et al., 2013) is: 30.4%  -----------------------------------------------------------------------------------------  From 10/17/2019-labs showed-Mild decrease in kidney function, push fluids and avoid anti-inflammatory drugs. Blood count is a little high. Would like to repeat renal panel and CBC when I see him back in 1 to 2 months.      Medications: Outpatient Medications Prior to Visit  Medication Sig  . albuterol (PROAIR HFA) 108 (90 Base) MCG/ACT inhaler INHALE 2 SPRAYS EVERY 4 HOURS AS NEEDED (Patient taking differently: Inhale 2 puffs into the lungs every 4 (four) hours as needed for wheezing or shortness of breath. INHALE 2 SPRAYS EVERY 4 HOURS AS NEEDED)  . albuterol (PROVENTIL) (2.5 MG/3ML) 0.083% nebulizer solution Take 3 mLs (2.5 mg total) by nebulization every 4 (four) hours as needed for wheezing or shortness of breath.  . fluticasone (FLOVENT HFA) 110 MCG/ACT inhaler INHALE 2 PUFFS INTO THE LUNGS TWICE A DAY  . hydrochlorothiazide (HYDRODIURIL) 25 MG tablet TAKE 1 TABLET BY MOUTH EVERY DAY  . Multiple Vitamins-Minerals (MULTIVITAMIN WITH MINERALS) tablet Take 1 tablet by mouth daily.    No facility-administered medications prior to visit.  Review of Systems  Constitutional: Negative for appetite change, chills and fever.  HENT: Negative.   Eyes: Negative.   Respiratory: Negative for chest tightness, shortness of breath and wheezing.   Cardiovascular: Negative for chest pain and palpitations.  Gastrointestinal: Negative for abdominal pain, nausea and vomiting.  Endocrine: Negative.   Allergic/Immunologic: Negative.   Neurological: Negative.   Hematological: Negative.    Psychiatric/Behavioral: Negative.     Last CBC Lab Results  Component Value Date   WBC 9.5 10/17/2019   HGB 19.2 (H) 10/17/2019   HCT 52.5 (H) 10/17/2019   MCV 93 10/17/2019   MCH 34.2 (H) 10/17/2019   RDW 11.9 10/17/2019   PLT 164 10/17/2019       Objective    BP (!) 149/80 (BP Location: Right Arm, Patient Position: Sitting, Cuff Size: Large)   Pulse 71   Temp (!) 97.1 F (36.2 C) (Temporal)   Ht 5\' 8"  (1.727 m)   Wt 233 lb 9.6 oz (106 kg)   BMI 35.52 kg/m  BP Readings from Last 3 Encounters:  12/18/19 (!) 149/80  10/17/19 (!) 143/76  06/23/19 131/75   Wt Readings from Last 3 Encounters:  12/18/19 233 lb 9.6 oz (106 kg)  10/17/19 235 lb (106.6 kg)  06/23/19 222 lb (100.7 kg)      Physical Exam Vitals reviewed.  Constitutional:      Appearance: He is well-developed. He is obese.  HENT:     Head: Normocephalic and atraumatic.     Right Ear: External ear normal.     Left Ear: External ear normal.     Nose: Nose normal.  Eyes:     General: No scleral icterus.    Conjunctiva/sclera: Conjunctivae normal.  Neck:     Thyroid: No thyromegaly.  Cardiovascular:     Rate and Rhythm: Normal rate and regular rhythm.     Heart sounds: Normal heart sounds.  Pulmonary:     Effort: Pulmonary effort is normal.     Breath sounds: Normal breath sounds.  Abdominal:     Palpations: Abdomen is soft.     Comments: Small/moderate umbilical hernia present.  Genitourinary:    Comments: Probable right hydrocele. Musculoskeletal:     Right lower leg: No edema.     Left lower leg: No edema.  Skin:    General: Skin is warm and dry.  Neurological:     General: No focal deficit present.     Mental Status: He is alert and oriented to person, place, and time. Mental status is at baseline.  Psychiatric:        Mood and Affect: Mood normal.        Behavior: Behavior normal.        Thought Content: Thought content normal.        Judgment: Judgment normal.       No results  found for any visits on 12/18/19.   Assessment & Plan    1. Essential hypertension Follow-up renal "profile - Renal function panel - CBC w/Diff/Platelet  2. Hyperlipidemia, unspecified hyperlipidemia type  - Renal function panel - CBC w/Diff/Platelet  3. ED (erectile dysfunction) of organic origin Patient has been on testosterone injections for ED and hypogonadism.  He states he is taking his last injection today.  4. History of colonic polyps  5.  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  Check it again today and then we will follow-up in a few months probably if it remains elevated.  If hemoglobin is above 20 may need to phlebotomize   No follow-ups on file.      I, Wilhemena Durie, MD, have reviewed all documentation for this visit. The documentation on 12/18/19 for the exam, diagnosis, procedures, and orders are all accurate and complete.    Autumne Kallio Cranford Mon, MD  [facility] [phone] (phone) [phone] (fax)  [place]

## 2019-12-18 ENCOUNTER — Other Ambulatory Visit: Payer: Self-pay

## 2019-12-18 ENCOUNTER — Encounter: Payer: Self-pay | Admitting: Family Medicine

## 2019-12-18 ENCOUNTER — Ambulatory Visit (INDEPENDENT_AMBULATORY_CARE_PROVIDER_SITE_OTHER): Payer: PPO | Admitting: Family Medicine

## 2019-12-18 VITALS — BP 149/80 | HR 71 | Temp 97.1°F | Ht 68.0 in | Wt 233.6 lb

## 2019-12-18 DIAGNOSIS — N529 Male erectile dysfunction, unspecified: Secondary | ICD-10-CM | POA: Diagnosis not present

## 2019-12-18 DIAGNOSIS — E785 Hyperlipidemia, unspecified: Secondary | ICD-10-CM | POA: Diagnosis not present

## 2019-12-18 DIAGNOSIS — Z86010 Personal history of colonic polyps: Secondary | ICD-10-CM | POA: Diagnosis not present

## 2019-12-18 DIAGNOSIS — I10 Essential (primary) hypertension: Secondary | ICD-10-CM

## 2019-12-19 LAB — CBC WITH DIFFERENTIAL/PLATELET
Basophils Absolute: 0.1 10*3/uL (ref 0.0–0.2)
Basos: 1 %
EOS (ABSOLUTE): 0.2 10*3/uL (ref 0.0–0.4)
Eos: 3 %
Hematocrit: 53 % — ABNORMAL HIGH (ref 37.5–51.0)
Hemoglobin: 18.6 g/dL — ABNORMAL HIGH (ref 13.0–17.7)
Immature Grans (Abs): 0 10*3/uL (ref 0.0–0.1)
Immature Granulocytes: 0 %
Lymphocytes Absolute: 2.2 10*3/uL (ref 0.7–3.1)
Lymphs: 28 %
MCH: 33.7 pg — ABNORMAL HIGH (ref 26.6–33.0)
MCHC: 35.1 g/dL (ref 31.5–35.7)
MCV: 96 fL (ref 79–97)
Monocytes Absolute: 0.8 10*3/uL (ref 0.1–0.9)
Monocytes: 11 %
Neutrophils Absolute: 4.5 10*3/uL (ref 1.4–7.0)
Neutrophils: 57 %
Platelets: 144 10*3/uL — ABNORMAL LOW (ref 150–450)
RBC: 5.52 x10E6/uL (ref 4.14–5.80)
RDW: 12.5 % (ref 11.6–15.4)
WBC: 7.8 10*3/uL (ref 3.4–10.8)

## 2019-12-19 LAB — RENAL FUNCTION PANEL
Albumin: 4.7 g/dL (ref 3.8–4.8)
BUN/Creatinine Ratio: 12 (ref 10–24)
BUN: 15 mg/dL (ref 8–27)
CO2: 20 mmol/L (ref 20–29)
Calcium: 9.5 mg/dL (ref 8.6–10.2)
Chloride: 101 mmol/L (ref 96–106)
Creatinine, Ser: 1.29 mg/dL — ABNORMAL HIGH (ref 0.76–1.27)
GFR calc Af Amer: 65 mL/min/{1.73_m2} (ref >59)
GFR calc non Af Amer: 56 mL/min/{1.73_m2} — ABNORMAL LOW (ref >59)
Glucose: 107 mg/dL — ABNORMAL HIGH (ref 65–99)
Phosphorus: 2.4 mg/dL — ABNORMAL LOW (ref 2.8–4.1)
Potassium: 4.5 mmol/L (ref 3.5–5.2)
Sodium: 139 mmol/L (ref 134–144)

## 2019-12-20 ENCOUNTER — Telehealth: Payer: Self-pay

## 2019-12-20 NOTE — Telephone Encounter (Signed)
-----   Message from Jerrol Banana., MD sent at 12/20/2019  8:59 AM EDT ----- Labs stable.

## 2019-12-20 NOTE — Telephone Encounter (Signed)
Patient advised.

## 2019-12-26 ENCOUNTER — Encounter
Admission: RE | Admit: 2019-12-26 | Discharge: 2019-12-26 | Disposition: A | Discharge status: Home / Self Care | Payer: PPO | Source: Ambulatory Visit | Attending: General Surgery | Admitting: General Surgery

## 2019-12-26 ENCOUNTER — Other Ambulatory Visit: Payer: Self-pay

## 2019-12-26 DIAGNOSIS — Z01818 Encounter for other preprocedural examination: Secondary | ICD-10-CM | POA: Insufficient documentation

## 2019-12-26 DIAGNOSIS — I10 Essential (primary) hypertension: Secondary | ICD-10-CM | POA: Insufficient documentation

## 2019-12-26 HISTORY — DX: Hyperlipidemia, unspecified: E78.5

## 2019-12-26 HISTORY — DX: Secondary polycythemia: D75.1

## 2019-12-26 NOTE — Patient Instructions (Addendum)
INSTRUCTIONS FOR SURGERY     Your surgery is scheduled for:   Monday, MAY 10th     To find out your arrival time for the day of surgery,          please call [phone] between 1 pm and 3 pm on :  Friday, MAY 6th     When you arrive for surgery, report to the [place].       Do NOT stop on the first floor to register.    REMEMBER: Instructions that are not followed completely may result in serious medical risk,  up to and including death, or upon the discretion of your surgeon and anesthesiologist,            your surgery may need to be rescheduled.  __X__ 1. Do not eat food after midnight the night before your procedure.                    No gum, candy, lozenger, tic tacs, tums or hard candies.                  ABSOLUTELY NOTHING SOLID IN YOUR MOUTH AFTER MIDNIGHT                    You may drink unlimited clear liquids up to 2 hours before you are scheduled to arrive for surgery.                   Do not drink anything within those 2 hours unless you need to take medicine, then take the                   smallest amount you need.  Clear liquids include:  water, apple juice without pulp,                   any flavor Gatorade, Black coffee, black tea.  Sugar may be added but no dairy/ honey /lemon.                        Broth and jello is not considered a clear liquid.  __x__  2. On the morning of surgery, please brush your teeth with toothpaste and water. You may rinse with                  mouthwash if you wish but DO NOT SWALLOW TOOTHPASTE OR MOUTHWASH  __X___3. NO alcohol for 24 hours before or after surgery.  __x___ 4.  Do NOT smoke or use e-cigarettes for 24 HOURS PRIOR TO SURGERY.                      DO NOT Use any chewable tobacco products for at least 6 hours prior to surgery.  __x___ 5. If you start any new medication after this appointment and prior to surgery, please  Bring it with you on the day of surgery.  ___x__ 6. Notify your doctor if there is any change in your medical condition, such as fever,  infection, vomitting, diarrhea or any open sores.  __x___ 7.  USE the CHG SOAP as instructed, the night before surgery and the day of surgery.                   Once you have washed with this soap, do NOT use any of the following: Powders, perfumes                    or lotions. Please do not wear make up, hairpins, clips or nail polish. You MAY  wear deodorant.                   Men may shave their face and neck.  Women need to shave 48 hours prior to surgery.                   DO NOT wear ANY jewelry on the day of surgery. If there are rings that are too tight to                    remove easily, please address this prior to the surgery day. Piercings need to be removed.                                                                     NO METAL ON YOUR BODY.                    Do NOT bring any valuables.  If you came to Pre-Admit testing then you will not need license,                     insurance card or credit card.  If you will be staying overnight, please either leave your things in                     the car or have your family be responsible for these items.                     [place] IS NOT RESPONSIBLE FOR BELONGINGS OR VALUABLES.  ___X__ 8. DO NOT wear contact lenses on surgery day.  You may not have dentures,                     Hearing aides, contacts or glasses in the operating room. These items can be                    Placed in the Recovery Room to receive immediately after surgery.  __x___ 9. IF YOU ARE SCHEDULED TO GO HOME ON THE SAME DAY, YOU MUST                   Have someone to drive you home and to stay with you  for the first 24 hours.                    Have an arrangement prior to arriving on surgery day.  ___x__ 10. Take the following medications on the morning of surgery with a sip of water:  1. FLOVENT                     2.                       _____ 11.  Follow any instructions provided to you by your surgeon.                        Such as enema, clear liquid bowel prep  __X__  12. STOP COUMADIN / PLAVIX / ELIQUIS / ASPIRIN AS OF:                       THIS INCLUDES BC POWDERS / GOODIES POWDER  __x___ 13. STOP Anti-inflammatories as of: TODAY, MAY 45TH                      This includes IBUPROFEN / MOTRIN / ADVIL / ALEVE/ NAPROXYN                    YOU MAY TAKE TYLENOL ANY TIME PRIOR TO SURGERY.  _X____ 14.  Stop supplements until after surgery.                     This includes: MULTI VITAMINS                 You may continue taking Vitamin B12 / Vitamin D3 but do not take on the morning of surgery.  _____ 15. Bring your CPAP machine into preop with you on the morning of surgery.  ______17.  Continue to take the following medications but do not take on the morning of surgery:                            HYDROCHLOROTHIAZIDE                       ______18. If staying overnight, please have appropriate shoes to wear to be able to walk around the unit.                   Wear clean and comfortable clothing to the hospital.                     ## BRING  A LIST OF PHONE [place].##

## 2019-12-28 ENCOUNTER — Other Ambulatory Visit: Payer: Self-pay

## 2019-12-28 ENCOUNTER — Other Ambulatory Visit
Admission: RE | Admit: 2019-12-28 | Discharge: 2019-12-28 | Disposition: A | Discharge status: Home / Self Care | Payer: PPO | Source: Ambulatory Visit | Attending: General Surgery | Admitting: General Surgery

## 2019-12-28 DIAGNOSIS — I10 Essential (primary) hypertension: Secondary | ICD-10-CM | POA: Diagnosis not present

## 2019-12-28 DIAGNOSIS — Z7951 Long term (current) use of inhaled steroids: Secondary | ICD-10-CM | POA: Diagnosis not present

## 2019-12-28 DIAGNOSIS — K420 Umbilical hernia with obstruction, without gangrene: Secondary | ICD-10-CM | POA: Diagnosis not present

## 2019-12-28 DIAGNOSIS — J45909 Unspecified asthma, uncomplicated: Secondary | ICD-10-CM | POA: Diagnosis not present

## 2019-12-28 DIAGNOSIS — Z87891 Personal history of nicotine dependence: Secondary | ICD-10-CM | POA: Diagnosis not present

## 2019-12-28 DIAGNOSIS — Z01812 Encounter for preprocedural laboratory examination: Secondary | ICD-10-CM | POA: Insufficient documentation

## 2019-12-28 DIAGNOSIS — G4730 Sleep apnea, unspecified: Secondary | ICD-10-CM | POA: Diagnosis not present

## 2019-12-28 DIAGNOSIS — Z20822 Contact with and (suspected) exposure to covid-19: Secondary | ICD-10-CM | POA: Insufficient documentation

## 2019-12-28 DIAGNOSIS — Z79899 Other long term (current) drug therapy: Secondary | ICD-10-CM | POA: Diagnosis not present

## 2019-12-28 DIAGNOSIS — Z96651 Presence of right artificial knee joint: Secondary | ICD-10-CM | POA: Diagnosis not present

## 2019-12-28 DIAGNOSIS — Z6832 Body mass index (BMI) 32.0-32.9, adult: Secondary | ICD-10-CM | POA: Diagnosis not present

## 2019-12-28 LAB — BASIC METABOLIC PANEL
Anion gap: 9 (ref 5–15)
BUN: 20 mg/dL (ref 8–23)
CO2: 25 mmol/L (ref 22–32)
Calcium: 9.2 mg/dL (ref 8.9–10.3)
Chloride: 103 mmol/L (ref 98–111)
Creatinine, Ser: 1.18 mg/dL (ref 0.61–1.24)
GFR calc Af Amer: >60 mL/min (ref >60)
GFR calc non Af Amer: >60 mL/min (ref >60)
Glucose, Bld: 98 mg/dL (ref 70–99)
Potassium: 4 mmol/L (ref 3.5–5.1)
Sodium: 137 mmol/L (ref 135–145)

## 2019-12-28 LAB — CBC
HCT: 49.7 % (ref 39.0–52.0)
Hemoglobin: 17.7 g/dL — ABNORMAL HIGH (ref 13.0–17.0)
MCH: 33.5 pg (ref 26.0–34.0)
MCHC: 35.6 g/dL (ref 30.0–36.0)
MCV: 94.1 fL (ref 80.0–100.0)
Platelets: 158 10*3/uL (ref 150–400)
RBC: 5.28 MIL/uL (ref 4.22–5.81)
RDW: 12.4 % (ref 11.5–15.5)
WBC: 8.2 10*3/uL (ref 4.0–10.5)
nRBC: 0 % (ref 0.0–0.2)

## 2019-12-28 LAB — SARS CORONAVIRUS 2 (TAT 6-24 HRS): SARS Coronavirus 2: NEGATIVE

## 2020-01-01 ENCOUNTER — Ambulatory Visit: Payer: PPO | Admitting: Anesthesiology

## 2020-01-01 ENCOUNTER — Ambulatory Visit
Admission: RE | Admit: 2020-01-01 | Discharge: 2020-01-01 | Disposition: A | Discharge status: Home / Self Care | Payer: PPO | Attending: General Surgery | Admitting: General Surgery

## 2020-01-01 ENCOUNTER — Other Ambulatory Visit: Payer: Self-pay

## 2020-01-01 ENCOUNTER — Encounter
Admission: RE | Disposition: A | Discharge status: Home / Self Care | Payer: Self-pay | Source: Home / Self Care | Attending: General Surgery

## 2020-01-01 ENCOUNTER — Encounter: Payer: Self-pay | Admitting: General Surgery

## 2020-01-01 DIAGNOSIS — Z7951 Long term (current) use of inhaled steroids: Secondary | ICD-10-CM | POA: Insufficient documentation

## 2020-01-01 DIAGNOSIS — Z87891 Personal history of nicotine dependence: Secondary | ICD-10-CM | POA: Insufficient documentation

## 2020-01-01 DIAGNOSIS — Z6832 Body mass index (BMI) 32.0-32.9, adult: Secondary | ICD-10-CM | POA: Insufficient documentation

## 2020-01-01 DIAGNOSIS — I10 Essential (primary) hypertension: Secondary | ICD-10-CM | POA: Diagnosis not present

## 2020-01-01 DIAGNOSIS — K420 Umbilical hernia with obstruction, without gangrene: Secondary | ICD-10-CM | POA: Insufficient documentation

## 2020-01-01 DIAGNOSIS — G4730 Sleep apnea, unspecified: Secondary | ICD-10-CM | POA: Diagnosis not present

## 2020-01-01 DIAGNOSIS — K219 Gastro-esophageal reflux disease without esophagitis: Secondary | ICD-10-CM | POA: Diagnosis not present

## 2020-01-01 DIAGNOSIS — K429 Umbilical hernia without obstruction or gangrene: Secondary | ICD-10-CM | POA: Diagnosis not present

## 2020-01-01 DIAGNOSIS — Z79899 Other long term (current) drug therapy: Secondary | ICD-10-CM | POA: Insufficient documentation

## 2020-01-01 DIAGNOSIS — J45909 Unspecified asthma, uncomplicated: Secondary | ICD-10-CM | POA: Insufficient documentation

## 2020-01-01 DIAGNOSIS — Z96651 Presence of right artificial knee joint: Secondary | ICD-10-CM | POA: Insufficient documentation

## 2020-01-01 HISTORY — PX: UMBILICAL HERNIA REPAIR: SHX196

## 2020-01-01 SURGERY — REPAIR, HERNIA, UMBILICAL, ADULT
Anesthesia: General

## 2020-01-01 MED ORDER — BUPIVACAINE HCL 0.5 % IJ SOLN
INTRAMUSCULAR | Status: DC | PRN
  Administered 2020-01-01: 30 mL

## 2020-01-01 MED ORDER — KETOROLAC TROMETHAMINE 30 MG/ML IJ SOLN
INTRAMUSCULAR | Status: AC
  Filled 2020-01-01: qty 1

## 2020-01-01 MED ORDER — ALBUTEROL SULFATE HFA 108 (90 BASE) MCG/ACT IN AERS
INHALATION_SPRAY | RESPIRATORY_TRACT | Status: AC
  Filled 2020-01-01: qty 6.7

## 2020-01-01 MED ORDER — CEFAZOLIN SODIUM-DEXTROSE 2-4 GM/100ML-% IV SOLN
2.0000 g | INTRAVENOUS | Status: AC
  Administered 2020-01-01: 2 g via INTRAVENOUS

## 2020-01-01 MED ORDER — FENTANYL CITRATE (PF) 100 MCG/2ML IJ SOLN
INTRAMUSCULAR | Status: AC
  Administered 2020-01-01: 09:00:00 25 ug via INTRAVENOUS
  Filled 2020-01-01: qty 2

## 2020-01-01 MED ORDER — SUCCINYLCHOLINE CHLORIDE 200 MG/10ML IV SOSY
PREFILLED_SYRINGE | INTRAVENOUS | Status: AC
  Filled 2020-01-01: qty 10

## 2020-01-01 MED ORDER — HYDROCODONE-ACETAMINOPHEN 5-325 MG PO TABS
1.0000 | ORAL_TABLET | ORAL | 0 refills | Status: DC | PRN
Start: 2020-01-01 — End: 2020-12-19

## 2020-01-01 MED ORDER — OXYCODONE HCL 5 MG/5ML PO SOLN: 5.0000 mg | Freq: Once | ORAL | Status: AC | PRN

## 2020-01-01 MED ORDER — CEFAZOLIN SODIUM-DEXTROSE 2-4 GM/100ML-% IV SOLN
INTRAVENOUS | Status: AC
  Filled 2020-01-01: qty 100

## 2020-01-01 MED ORDER — ONDANSETRON HCL 4 MG/2ML IJ SOLN
INTRAMUSCULAR | Status: AC
  Filled 2020-01-01: qty 2

## 2020-01-01 MED ORDER — PROPOFOL 10 MG/ML IV BOLUS
INTRAVENOUS | Status: AC
  Filled 2020-01-01: qty 20

## 2020-01-01 MED ORDER — LIDOCAINE HCL (CARDIAC) PF 100 MG/5ML IV SOSY
PREFILLED_SYRINGE | INTRAVENOUS | Status: DC | PRN
  Administered 2020-01-01: 100 mg via INTRAVENOUS

## 2020-01-01 MED ORDER — ALBUTEROL SULFATE HFA 108 (90 BASE) MCG/ACT IN AERS
INHALATION_SPRAY | RESPIRATORY_TRACT | Status: DC | PRN
  Administered 2020-01-01: 2 via RESPIRATORY_TRACT

## 2020-01-01 MED ORDER — FENTANYL CITRATE (PF) 100 MCG/2ML IJ SOLN
INTRAMUSCULAR | Status: DC | PRN
  Administered 2020-01-01 (×2): 50 ug via INTRAVENOUS

## 2020-01-01 MED ORDER — DEXAMETHASONE SODIUM PHOSPHATE 10 MG/ML IJ SOLN
INTRAMUSCULAR | Status: AC
  Filled 2020-01-01: qty 1

## 2020-01-01 MED ORDER — SUCCINYLCHOLINE CHLORIDE 20 MG/ML IJ SOLN
INTRAMUSCULAR | Status: DC | PRN
  Administered 2020-01-01: 140 mg via INTRAVENOUS

## 2020-01-01 MED ORDER — ONDANSETRON HCL 4 MG/2ML IJ SOLN
INTRAMUSCULAR | Status: DC | PRN
  Administered 2020-01-01: 4 mg via INTRAVENOUS

## 2020-01-01 MED ORDER — ACETAMINOPHEN 10 MG/ML IV SOLN
INTRAVENOUS | Status: AC
  Filled 2020-01-01: qty 100

## 2020-01-01 MED ORDER — BUPIVACAINE HCL (PF) 0.5 % IJ SOLN
INTRAMUSCULAR | Status: AC
  Filled 2020-01-01: qty 30

## 2020-01-01 MED ORDER — PROPOFOL 10 MG/ML IV BOLUS
INTRAVENOUS | Status: DC | PRN
  Administered 2020-01-01: 200 mg via INTRAVENOUS

## 2020-01-01 MED ORDER — MIDAZOLAM HCL 2 MG/2ML IJ SOLN
INTRAMUSCULAR | Status: AC
  Filled 2020-01-01: qty 2

## 2020-01-01 MED ORDER — FAMOTIDINE 20 MG PO TABS: 20.0000 mg | ORAL_TABLET | Freq: Once | ORAL | Status: AC

## 2020-01-01 MED ORDER — OXYCODONE HCL 5 MG PO TABS
5.0000 mg | ORAL_TABLET | Freq: Once | ORAL | Status: AC | PRN
  Administered 2020-01-01: 5 mg via ORAL

## 2020-01-01 MED ORDER — LIDOCAINE HCL (PF) 2 % IJ SOLN
INTRAMUSCULAR | Status: AC
  Filled 2020-01-01: qty 5

## 2020-01-01 MED ORDER — OXYCODONE HCL 5 MG PO TABS
ORAL_TABLET | ORAL | Status: AC
  Filled 2020-01-01: qty 1

## 2020-01-01 MED ORDER — LACTATED RINGERS IV SOLN: INTRAVENOUS | Status: DC

## 2020-01-01 MED ORDER — MIDAZOLAM HCL 2 MG/2ML IJ SOLN
INTRAMUSCULAR | Status: DC | PRN
  Administered 2020-01-01: 1 mg via INTRAVENOUS

## 2020-01-01 MED ORDER — DEXAMETHASONE SODIUM PHOSPHATE 10 MG/ML IJ SOLN
INTRAMUSCULAR | Status: DC | PRN
  Administered 2020-01-01: 10 mg via INTRAVENOUS

## 2020-01-01 MED ORDER — ACETAMINOPHEN 10 MG/ML IV SOLN: 1000.0000 mg | Freq: Once | INTRAVENOUS | Status: DC | PRN

## 2020-01-01 MED ORDER — ONDANSETRON HCL 4 MG/2ML IJ SOLN: 4.0000 mg | Freq: Once | INTRAMUSCULAR | Status: DC | PRN

## 2020-01-01 MED ORDER — FENTANYL CITRATE (PF) 100 MCG/2ML IJ SOLN
INTRAMUSCULAR | Status: AC
  Filled 2020-01-01: qty 2

## 2020-01-01 MED ORDER — FENTANYL CITRATE (PF) 100 MCG/2ML IJ SOLN
25.0000 ug | INTRAMUSCULAR | Status: DC | PRN
  Administered 2020-01-01: 25 ug via INTRAVENOUS

## 2020-01-01 MED ORDER — PHENYLEPHRINE HCL (PRESSORS) 10 MG/ML IV SOLN
INTRAVENOUS | Status: DC | PRN
  Administered 2020-01-01: 100 ug via INTRAVENOUS

## 2020-01-01 MED ORDER — ROCURONIUM BROMIDE 10 MG/ML (PF) SYRINGE
PREFILLED_SYRINGE | INTRAVENOUS | Status: AC
  Filled 2020-01-01: qty 10

## 2020-01-01 MED ORDER — ACETAMINOPHEN 10 MG/ML IV SOLN
INTRAVENOUS | Status: DC | PRN
  Administered 2020-01-01: 1000 mg via INTRAVENOUS

## 2020-01-01 MED ORDER — FAMOTIDINE 20 MG PO TABS
ORAL_TABLET | ORAL | Status: AC
  Administered 2020-01-01: 07:00:00 20 mg via ORAL
  Filled 2020-01-01: qty 1

## 2020-01-01 SURGICAL SUPPLY — 33 items
BLADE SURG 15 STRL SS SAFETY (BLADE) ×3 IMPLANT
CANISTER SUCT 1200ML W/VALVE (MISCELLANEOUS) ×3 IMPLANT
CHLORAPREP W/TINT 26 (MISCELLANEOUS) ×3 IMPLANT
CLOSURE WOUND 1/2 X4 (GAUZE/BANDAGES/DRESSINGS) ×1
COVER WAND RF STERILE (DRAPES) ×3 IMPLANT
DRAPE LAPAROTOMY 100X77 ABD (DRAPES) ×3 IMPLANT
DRSG TEGADERM 4X4.75 (GAUZE/BANDAGES/DRESSINGS) ×3 IMPLANT
DRSG TELFA 4X3 1S NADH ST (GAUZE/BANDAGES/DRESSINGS) ×3 IMPLANT
ELECT REM PT RETURN 9FT ADLT (ELECTROSURGICAL) ×3
ELECTRODE REM PT RTRN 9FT ADLT (ELECTROSURGICAL) ×1 IMPLANT
GLOVE BIO SURGEON STRL SZ7.5 (GLOVE) ×3 IMPLANT
GLOVE INDICATOR 8.0 STRL GRN (GLOVE) ×3 IMPLANT
GOWN STRL REUS W/ TWL LRG LVL3 (GOWN DISPOSABLE) ×2 IMPLANT
GOWN STRL REUS W/TWL LRG LVL3 (GOWN DISPOSABLE) ×4
KIT TURNOVER KIT A (KITS) ×3 IMPLANT
LABEL OR SOLS (LABEL) ×1 IMPLANT
MESH VENTRALEX ST 8CM LRG (Mesh General) ×2 IMPLANT
NDL HYPO 25X1 1.5 SAFETY (NEEDLE) ×1 IMPLANT
NEEDLE HYPO 22GX1.5 SAFETY (NEEDLE) ×6 IMPLANT
NEEDLE HYPO 25X1 1.5 SAFETY (NEEDLE) ×3 IMPLANT
NS IRRIG 500ML POUR BTL (IV SOLUTION) ×3 IMPLANT
PACK BASIN MINOR (MISCELLANEOUS) ×3 IMPLANT
STRIP CLOSURE SKIN 1/2X4 (GAUZE/BANDAGES/DRESSINGS) ×2 IMPLANT
SUT PROLENE 0 CT 1 30 (SUTURE) ×3 IMPLANT
SUT SURGILON 0 BLK (SUTURE) ×4 IMPLANT
SUT VIC AB 3-0 54X BRD REEL (SUTURE) ×2 IMPLANT
SUT VIC AB 3-0 BRD 54 (SUTURE) ×2
SUT VIC AB 3-0 SH 27 (SUTURE) ×2
SUT VIC AB 3-0 SH 27X BRD (SUTURE) ×1 IMPLANT
SUT VIC AB 4-0 FS2 27 (SUTURE) ×1 IMPLANT
SWABSTK COMLB BENZOIN TINCTURE (MISCELLANEOUS) ×3 IMPLANT
SYR 10ML LL (SYRINGE) ×3 IMPLANT
SYR 3ML LL SCALE MARK (SYRINGE) ×3 IMPLANT

## 2020-01-01 NOTE — Op Note (Signed)
Preoperative diagnosis: Umbilical hernia with incarcerated fat.  Postoperative diagnosis: Same.  Operative procedure: Umbilical hernia repair with 8 cm Ventralex ST mesh.  Operating surgeon: Charlie Pitter, MD.  Anesthesia: General endotracheal, Marcaine 0.5%, plain, 30 cc.  Estimated blood loss: Less than 5 cc.  Clinical note: This 69 year old male is developed a symptomatic and enlarging umbilical hernia.  He is admitted for elective repair.  Hair was removed from the surgical site prior to presentation of the operating theater.  He received Ancef intravenously for antibiotic prophylaxis.  SCD stockings for DVT prevention.  Operative note: With the patient under adequate general endotracheal anesthesia the abdomen was cleansed with ChloraPrep and draped.  An infraumbilical incision was made.  The umbilical skin was then elevated at which point a "buttonhole" was identified.  An approximately 8 mm strip of skin was removed from the upper flap for closure.  The umbilical hernia sac was freed from the skin and the underlying fascia.  This was very thin.  The contents were returned to the abdominal cavity.  The fascial defect was 2.5 cm.  Based on the patient's body habitus it was elected to place an 8 cm ventral light ST mesh intraperitoneally.  This was snug to the anterior abdominal surface which was cleared circumferentially.  There was no evidence of an epigastric hernia.  Transfascial sutures were placed sequentially adherent to the supporting ring with 0 Surgilon.  The fascial defect was then closed with interrupted 0 Surgilon sutures taking a "bite" of the mesh for fixation.  The umbilical skin was tacked to the fascia with a 3-0 Vicryl figure-of-eight suture.  The adipose layer was approximated with a running 3-0 Vicryl suture.  The skin was closed with a running 4-0 Vicryl subcuticular suture.  Benzoin, Steri-Strips, Telfa and Tegaderm dressings were applied.  Patient tolerated the procedure  well and was taken to recovery in stable condition.

## 2020-01-01 NOTE — Anesthesia Postprocedure Evaluation (Signed)
Anesthesia Post Note  Patient: Kenneth Hester  Procedure(s) Performed: HERNIA REPAIR UMBILICAL ADULT (N/A )  Patient location during evaluation: PACU Anesthesia Type: General Level of consciousness: awake and alert Pain management: pain level controlled Vital Signs Assessment: post-procedure vital signs reviewed and stable Respiratory status: spontaneous breathing, nonlabored ventilation, respiratory function stable and patient connected to nasal cannula oxygen Cardiovascular status: blood pressure returned to baseline and stable Postop Assessment: no apparent nausea or vomiting Anesthetic complications: no     Last Vitals:  Vitals:   01/01/20 0937 01/01/20 0940  BP:  (!) 196/88  Pulse: 61   Resp:    Temp: (!) 35.9 C   SpO2: 95%     Last Pain:  Vitals:   01/01/20 0937  TempSrc: Temporal  PainSc: 3                  Arita Miss

## 2020-01-01 NOTE — Anesthesia Procedure Notes (Addendum)
Procedure Name: Intubation Date/Time: 01/01/2020 7:48 AM Performed by: Caryl Asp, CRNA Pre-anesthesia Checklist: Patient identified, Patient being monitored, Timeout performed, Emergency Drugs available and Suction available Patient Re-evaluated:Patient Re-evaluated prior to induction Oxygen Delivery Method: Circle system utilized Preoxygenation: Pre-oxygenation with 100% oxygen Induction Type: IV induction Ventilation: Oral airway inserted - appropriate to patient size Laryngoscope Size: McGraph and 4 Grade View: Grade II Tube type: Oral Tube size: 7.5 mm Number of attempts: 1 Airway Equipment and Method: Stylet Placement Confirmation: ETT inserted through vocal cords under direct vision,  positive ETCO2 and breath sounds checked- equal and bilateral Secured at: 22 cm Tube secured with: Tape Dental Injury: Teeth and Oropharynx as per pre-operative assessment
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
## 2020-01-01 NOTE — Transfer of Care (Signed)
Immediate Anesthesia Transfer of Care Note  Patient: Kenneth Hester  Procedure(s) Performed: HERNIA REPAIR UMBILICAL ADULT (N/A )  Patient Location: PACU  Anesthesia Type:General  Level of Consciousness: awake, alert  and oriented  Airway & Oxygen Therapy: Patient Spontanous Breathing and Patient connected to face mask oxygen  Post-op Assessment: Report given to RN and Post -op Vital signs reviewed and stable  Post vital signs: Reviewed and stable  Last Vitals:  Vitals Value Taken Time  BP 178/98 01/01/20 0847  Temp 36.4 C 01/01/20 0843  Pulse 65 01/01/20 0848  Resp 12 01/01/20 0848  SpO2 98 % 01/01/20 0848  Vitals shown include unvalidated device data.  Last Pain:  Vitals:   01/01/20 0629  TempSrc: Tympanic  PainSc: 0-No pain         Complications: No apparent anesthesia complications

## 2020-01-01 NOTE — Discharge Instructions (Signed)
AMBULATORY SURGERY  °DISCHARGE INSTRUCTIONS ° ° °1) The drugs that you were given will stay in your system until tomorrow so for the next 24 hours you should not: ° °A) Drive an automobile °B) Make any legal decisions °C) Drink any alcoholic beverage ° ° °2) You may resume regular meals tomorrow.  Today it is better to start with liquids and gradually work up to solid foods. ° °You may eat anything you prefer, but it is better to start with liquids, then soup and crackers, and gradually work up to solid foods. ° ° °3) Please notify your doctor immediately if you have any unusual bleeding, trouble breathing, redness and pain at the surgery site, drainage, fever, or pain not relieved by medication. ° ° ° °4) Additional Instructions: ° ° ° ° ° ° ° °Please contact your physician with any problems or Same Day Surgery at [phone], Monday through Friday 6 am to 4 pm, or [place] [facility] Main number at [phone].AMBULATORY SURGERY  °DISCHARGE INSTRUCTIONS ° ° °5) The drugs that you were given will stay in your system until tomorrow so for the next 24 hours you should not: ° °D) Drive an automobile °E) Make any legal decisions °F) Drink any alcoholic beverage ° ° °6) You may resume regular meals tomorrow.  Today it is better to start with liquids and gradually work up to solid foods. ° °You may eat anything you prefer, but it is better to start with liquids, then soup and crackers, and gradually work up to solid foods. ° ° °7) Please notify your doctor immediately if you have any unusual bleeding, trouble breathing, redness and pain at the surgery site, drainage, fever, or pain not relieved by medication. ° ° ° °8) Additional Instructions: ° ° ° ° ° ° ° °Please contact your physician with any problems or Same Day Surgery at [phone], Monday through Friday 6 am to 4 pm, or  at [place] Main number at [phone]. °

## 2020-01-01 NOTE — Anesthesia Preprocedure Evaluation (Addendum)
Anesthesia Evaluation  Patient identified by MRN, date of birth, ID band Patient awake    Reviewed: Allergy & Precautions, NPO status , Patient's Chart, lab work & pertinent test results  History of Anesthesia Complications Negative for: history of anesthetic complications  Airway Mallampati: III  TM Distance: >3 FB Neck ROM: Full   Comment: Large neck Dental  (+) Implants, Dental Advisory Given   Pulmonary asthma , sleep apnea and Continuous Positive Airway Pressure Ventilation , Patient abstained from smoking.Not current smoker, former smoker,    breath sounds clear to auscultation- rhonchi (-) wheezing      Cardiovascular hypertension, Pt. on medications (-) CAD, (-) Past MI, (-) Cardiac Stents and (-) CABG  Rhythm:Regular Rate:Normal - Systolic murmurs and - Diastolic murmurs    Neuro/Psych neg Seizures negative neurological ROS  negative psych ROS   GI/Hepatic Neg liver ROS, GERD  Controlled,  Endo/Other  negative endocrine ROSneg diabetes  Renal/GU negative Renal ROS     Musculoskeletal  (+) Arthritis ,   Abdominal (+) + obese,   Peds  Hematology negative hematology ROS (+)   Anesthesia Other Findings Past Medical History: No date: Arthritis No date: Asthma 12/05/2013: Chest pain 12/15/2016: ED (erectile dysfunction) of organic origin No date: Family history of adverse reaction to anesthesia     Comment:  Patients mother went into cardiac arrest twice during               surgery 20 years ago No date: GERD (gastroesophageal reflux disease) No date: Hypertension 12/05/2013: Morbid obesity ([place]) No date: Pneumonia     Comment:  hx of 08/26/2016: Primary localized osteoarthritis of left knee No date: Primary localized osteoarthritis of right knee No date: Sleep apnea     Comment:  wears CPAP nightly No date: Urgency of urination   Reproductive/Obstetrics                              Anesthesia Physical  Anesthesia Plan  ASA: III  Anesthesia Plan: General   Post-op Pain Management:    Induction: Intravenous  PONV Risk Score and Plan: 4 or greater and Dexamethasone and Ondansetron  Airway Management Planned: LMA and Oral ETT  Additional Equipment: None  Intra-op Plan:   Post-operative Plan: Extubation in OR  Informed Consent: I have reviewed the patients History and Physical, chart, labs and discussed the procedure including the risks, benefits and alternatives for the proposed anesthesia with the patient or authorized representative who has indicated his/her understanding and acceptance.     Dental advisory given  Plan Discussed with: CRNA and Anesthesiologist  Anesthesia Plan Comments: (Discussed risks of anesthesia with patient, including PONV, sore throat, lip/dental damage. Rare risks discussed as well, such as cardiorespiratory and neurological sequelae. Patient understands.)       Anesthesia Quick Evaluation

## 2020-01-01 NOTE — Progress Notes (Signed)
Blood pressure 177/86  No new orders from dr Lauralyn Primes pack to abd

## 2020-05-12 ENCOUNTER — Other Ambulatory Visit: Payer: Self-pay | Admitting: Family Medicine

## 2020-05-12 NOTE — Telephone Encounter (Signed)
Requested Prescriptions  Pending Prescriptions Disp Refills   hydrochlorothiazide (HYDRODIURIL) 25 MG tablet [Pharmacy Med Name: HYDROCHLOROTHIAZIDE 25 MG TAB] 90 tablet 1    Sig: TAKE 1 TABLET BY MOUTH EVERY DAY     Cardiovascular: Diuretics - Thiazide Failed - 05/12/2020 12:52 AM      Failed - Last BP in normal range    BP Readings from Last 1 Encounters:  01/01/20 (!) 179/86         Passed - Ca in normal range and within 360 days    Calcium  Date Value Ref Range Status  12/28/2019 9.2 8.9 - 10.3 mg/dL Final   Calcium, Total  Date Value Ref Range Status  05/13/2012 8.6 8.5 - 10.1 mg/dL Final         Passed - Cr in normal range and within 360 days    Creatinine  Date Value Ref Range Status  05/13/2012 1.18 0.60 - 1.30 mg/dL Final   Creatinine, Ser  Date Value Ref Range Status  12/28/2019 1.18 0.61 - 1.24 mg/dL Final         Passed - K in normal range and within 360 days    Potassium  Date Value Ref Range Status  12/28/2019 4.0 3.5 - 5.1 mmol/L Final  05/13/2012 4.3 3.5 - 5.1 mmol/L Final         Passed - Na in normal range and within 360 days    Sodium  Date Value Ref Range Status  12/28/2019 137 135 - 145 mmol/L Final  12/18/2019 139 134 - 144 mmol/L Final  05/13/2012 138 136 - 145 mmol/L Final         Passed - Valid encounter within last 6 months    Recent Outpatient Visits          4 months ago Essential hypertension   [facility] Jerrol Banana., MD   6 months ago Encounter for annual physical exam   [facility] Jerrol Banana., MD   1 year ago Palpitations   [facility] Jerrol Banana., MD   1 year ago Encounter for annual physical exam   [facility] Jerrol Banana., MD   2 years ago Essential hypertension   [facility] Jerrol Banana., MD      Future Appointments            In 1 month Jerrol Banana., MD [facility],  [facility]   In 5 months Jerrol Banana., MD [facility], [facility]           BP reading with protocol was when patient had umbilical hernia repair.

## 2020-05-24 NOTE — Progress Notes (Signed)
Established patient visit   Patient: Kenneth Hester   DOB: 04-Aug-1951   69 y.o. Male  MRN: [id number] Visit Date: 05/27/2020  Today's healthcare provider: Wilhemena Durie, MD   Chief Complaint  Patient presents with  . Shoulder Pain  . Neck Pain  . Fall   Subjective    Shoulder Pain  This is a chronic problem. Episode onset: Pt reports bilateral shoulder pain.  The problem has been unchanged. Pertinent negatives include no fever.  Neck Pain  This is a chronic problem. Episode onset: Pt states this has been going on for several months.  The pain is associated with nothing. Pertinent negatives include no chest pain, fever, headaches or visual change.  Dizziness The problem has been waxing and waning (Has been going on for about a year.  Worsening since falling last week. ). Associated symptoms include arthralgias, neck pain and vertigo. Pertinent negatives include no abdominal pain, chest pain, chills, fever, headaches, joint swelling, myalgias, nausea, visual change or vomiting.  Fall The accident occurred 5 to 7 days ago. Fall occurred: Pt report falling after hitting is head on the "t-top of his boat."  He states he landed on his back on the dock. Point of impact: Back. Pertinent negatives include no abdominal pain, fever, headaches, loss of consciousness, nausea, visual change or vomiting.    He has no chest pain with exertion, no dyspnea on exertion.  The shoulder and neck pain he has does not occur when he walks.  He is on the phone and computer multiple hours a day and thinks this might be a culprit.  The dizziness he is describing is worse when he is supine and when he moves his head quickly.  Patient Active Problem List   Diagnosis Date Noted  . History of colonic polyps   . 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 Primary localized osteoarthritis of left knee 08/26/2016  . Umbilical hernia without obstruction and without gangrene 05/26/2016  .  Encounter for screening colonoscopy 12/03/2015  . Primary localized osteoarthritis of right knee   . Hypertension   . Chest pain 12/05/2013  . Sleep apnea 12/05/2013  . Morbid obesity ([place]) 12/05/2013  . GERD (gastroesophageal reflux disease) 12/05/2013  . Asthma 12/05/2013   Past Medical History:  Diagnosis Date  . Arthritis   . Asthma   . Chest pain 12/05/2013  . 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 Family history of adverse reaction to anesthesia    Patients mother went into cardiac arrest twice during surgery 20 years ago  . GERD (gastroesophageal reflux disease)   . Hyperlipidemia   . Hypertension   . Morbid obesity ([place]) 12/05/2013  . Pneumonia    hx of  . 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 May need to be phlebotomized if hgb goes above 20  . Primary localized osteoarthritis of left knee 08/26/2016  . Primary localized osteoarthritis of right knee   . Renal insufficiency 10/2019   mild case. may stem from use of testosterone.  . Sleep apnea    has not had his cpap for a while. does not use, but still needs it.  . Urgency of urination    Social History   Tobacco Use  . Smoking status: Former Smoker    Types: Cigarettes    Quit date: 1980    Years since quitting: 41.7  . Smokeless tobacco: Never Used  . Tobacco comment: > 40  years ago  Vaping Use  . Vaping Use: Never used  Substance Use Topics  . Alcohol use: No  . Drug use: No   Allergies  Allergen Reactions  . Achromycin [Tetracycline] Rash  . Adhesive [Tape] Rash    Paper tape is okay  . Lisinopril Cough    cough, voice was affected and had hard time singing     Medications: Outpatient Medications Prior to Visit  Medication Sig  . albuterol (PROAIR HFA) 108 (90 Base) MCG/ACT inhaler INHALE 2 SPRAYS EVERY 4 HOURS AS NEEDED (Patient taking differently: Inhale 2 puffs into the lungs every 4 (four) hours as needed for wheezing or shortness of breath. INHALE 2 SPRAYS EVERY 4  HOURS AS NEEDED)  . albuterol (PROVENTIL) (2.5 MG/3ML) 0.083% nebulizer solution Take 3 mLs (2.5 mg total) by nebulization every 4 (four) hours as needed for wheezing or shortness of breath.  . fluticasone (FLOVENT HFA) 110 MCG/ACT inhaler INHALE 2 PUFFS INTO THE LUNGS TWICE A DAY  . hydrochlorothiazide (HYDRODIURIL) 25 MG tablet TAKE 1 TABLET BY MOUTH EVERY DAY  . HYDROcodone-acetaminophen (NORCO/VICODIN) 5-325 MG tablet Take 1 tablet by mouth every 4 (four) hours as needed for moderate pain.  . Multiple Vitamins-Minerals (MULTIVITAMIN WITH MINERALS) tablet Take 1 tablet by mouth daily.    No facility-administered medications prior to visit.    Review of Systems  Constitutional: Negative for appetite change, chills and fever.  Eyes: Negative.   Respiratory: Negative.  Negative for chest tightness, shortness of breath and wheezing.   Cardiovascular: Negative.  Negative for chest pain and palpitations.  Gastrointestinal: Negative.  Negative for abdominal pain, nausea and vomiting.  Musculoskeletal: Positive for arthralgias and neck pain. Negative for back pain, gait problem, joint swelling, myalgias and neck stiffness.  Neurological: Positive for dizziness and vertigo. Negative for loss of consciousness, light-headedness and headaches.      Objective    BP 131/70 (BP Location: Right Arm, Patient Position: Sitting, Cuff Size: Large)   Pulse 73   Temp 98.1 F (36.7 C) (Oral)   Wt 238 lb (108 kg)   BMI 34.15 kg/m    Physical Exam Vitals reviewed.  Constitutional:      Appearance: He is well-developed. He is obese.  HENT:     Head: Normocephalic and atraumatic.     Right Ear: External ear normal.     Left Ear: External ear normal.     Nose: Nose normal.  Eyes:     General: No scleral icterus.    Conjunctiva/sclera: Conjunctivae normal.  Neck:     Thyroid: No thyromegaly.  Cardiovascular:     Rate and Rhythm: Normal rate and regular rhythm.     Heart sounds: Normal heart  sounds.     Comments: No carotid bruit Pulmonary:     Effort: Pulmonary effort is normal.     Breath sounds: Normal breath sounds.  Abdominal:     General: Bowel sounds are normal.     Palpations: Abdomen is soft.  Musculoskeletal:     Right lower leg: No edema.     Left lower leg: No edema.     Comments: Mild anterior shoulder tenderness bilaterally.  Some impingement when he goes beyond 90 degrees with abduction of arms/shoulders.  Skin:    General: Skin is warm and dry.  Neurological:     General: No focal deficit present.     Mental Status: He is alert and oriented to person, place, and time.     Comments:  Some nystagmus with rightward gaze  Psychiatric:        Mood and Affect: Mood normal.        Behavior: Behavior normal.        Thought Content: Thought content normal.        Judgment: Judgment normal.       No results found for any visits on 05/27/20.  Assessment & Plan     1. Vertigo Try meclizine 25 mg every 8 hours as needed.  May need ENT referral or neurology referral depending on her other symptoms.  2. Neck pain I think this is just cervical strain from ergonomic issues regarding phone and computer. Have advised him to consider how to address this.  No imaging at this time.  3. Bilateral shoulder pain, unspecified chronicity Again, I think this is ergonomics.  I do not think that this is PMR.  Certainly could be shoulder issues that need addressing at some point time by orthopedics.  4. Morbid obesity ([place]) Continue to work on diet and exercise.  5. Obstructive sleep apnea syndrome   6. Primary hypertension Good control with HCTZ   No follow-ups on file.      I, Wilhemena Durie, MD, have reviewed all documentation for this visit. The documentation on 05/29/20 for the exam, diagnosis, procedures, and orders are all accurate and complete.    Shawanda Sievert Cranford Mon, MD  [facility] [phone] (phone) [phone] (fax)  [place]

## 2020-05-27 ENCOUNTER — Other Ambulatory Visit: Payer: Self-pay

## 2020-05-27 ENCOUNTER — Encounter: Payer: Self-pay | Admitting: Family Medicine

## 2020-05-27 ENCOUNTER — Ambulatory Visit (INDEPENDENT_AMBULATORY_CARE_PROVIDER_SITE_OTHER): Payer: PPO | Admitting: Family Medicine

## 2020-05-27 VITALS — BP 131/70 | HR 73 | Temp 98.1°F | Wt 238.0 lb

## 2020-05-27 DIAGNOSIS — I10 Essential (primary) hypertension: Secondary | ICD-10-CM | POA: Diagnosis not present

## 2020-05-27 DIAGNOSIS — M25512 Pain in left shoulder: Secondary | ICD-10-CM

## 2020-05-27 DIAGNOSIS — M25511 Pain in right shoulder: Secondary | ICD-10-CM | POA: Diagnosis not present

## 2020-05-27 DIAGNOSIS — M542 Cervicalgia: Secondary | ICD-10-CM | POA: Diagnosis not present

## 2020-05-27 DIAGNOSIS — R42 Dizziness and giddiness: Secondary | ICD-10-CM

## 2020-05-27 DIAGNOSIS — G4733 Obstructive sleep apnea (adult) (pediatric): Secondary | ICD-10-CM | POA: Diagnosis not present

## 2020-05-27 MED ORDER — MECLIZINE HCL 25 MG PO TABS: 25.0000 mg | ORAL_TABLET | Freq: Three times a day (TID) | ORAL | 1 refills | Status: AC | PRN

## 2020-06-19 ENCOUNTER — Ambulatory Visit: Payer: Self-pay | Admitting: Family Medicine

## 2020-07-29 NOTE — Progress Notes (Unsigned)
Established patient visit   Patient: Kenneth Hester   DOB: [birth year]   69 y.o. Male  MRN: [id number] Visit Date: 07/30/2020  Today's healthcare provider: Wilhemena Durie, MD   No chief complaint on file.  Subjective    HPI  Hypertension, follow-up  BP Readings from Last 3 Encounters:  05/27/20 131/70  01/01/20 (!) 179/86  12/18/19 (!) 149/80   Wt Readings from Last 3 Encounters:  05/27/20 238 lb (108 kg)  12/26/19 227 lb (103 kg)  12/18/19 233 lb 9.6 oz (106 kg)     He was last seen for hypertension 2 months ago.  BP at that visit was 149/80. Management since that visit includes; Good control with HCTZ. He reports {excellent/good/fair/poor:19665} compliance with treatment. He {is/is not:9024} having side effects. {document side effects if present:1} He {is/is not:9024} exercising. He {is/is not:9024} adherent to low salt diet.   Outside blood pressures are {enter patient reported home BP, or 'not being checked':1}.  He {does/does not:200015} smoke.  Use of agents associated with hypertension: {bp agents assoc with hypertension:511::"none"}.   --------------------------------------------------------------------------------------------------- Vertigo From 06/16/2020-Try meclizine 25 mg every 8 hours as needed.  May need ENT referral or neurology referral depending on her other symptoms.  Neck pain From 06/16/2020-I think this is just cervical strain from ergonomic issues regarding phone and computer. Have advised him to consider how to address this.  No imaging at this time.  Bilateral shoulder pain, unspecified chronicity From 06/16/2020-Again, I think this is ergonomics.  I do not think that this is PMR.  Certainly could be shoulder issues that need addressing at some point time by orthopedics.    {Show patient history (optional):23778::" "}   Medications: Outpatient Medications Prior to Visit  Medication Sig  . albuterol (PROAIR HFA) 108 (90  Base) MCG/ACT inhaler INHALE 2 SPRAYS EVERY 4 HOURS AS NEEDED (Patient taking differently: Inhale 2 puffs into the lungs every 4 (four) hours as needed for wheezing or shortness of breath. INHALE 2 SPRAYS EVERY 4 HOURS AS NEEDED)  . albuterol (PROVENTIL) (2.5 MG/3ML) 0.083% nebulizer solution Take 3 mLs (2.5 mg total) by nebulization every 4 (four) hours as needed for wheezing or shortness of breath.  . fluticasone (FLOVENT HFA) 110 MCG/ACT inhaler INHALE 2 PUFFS INTO THE LUNGS TWICE A DAY  . hydrochlorothiazide (HYDRODIURIL) 25 MG tablet TAKE 1 TABLET BY MOUTH EVERY DAY  . HYDROcodone-acetaminophen (NORCO/VICODIN) 5-325 MG tablet Take 1 tablet by mouth every 4 (four) hours as needed for moderate pain.  . meclizine (ANTIVERT) 25 MG tablet Take 1 tablet (25 mg total) by mouth 3 (three) times daily as needed for dizziness.  . Multiple Vitamins-Minerals (MULTIVITAMIN WITH MINERALS) tablet Take 1 tablet by mouth daily.    No facility-administered medications prior to visit.    Review of Systems  Constitutional: Negative for appetite change, chills and fever.  Respiratory: Negative for chest tightness, shortness of breath and wheezing.   Cardiovascular: Negative for chest pain and palpitations.  Gastrointestinal: Negative for abdominal pain, nausea and vomiting.    {Heme  Chem  Endocrine  Serology  Results Review (optional):23779::" "}  Objective    There were no vitals taken for this visit. {Show previous vital signs (optional):23777::" "}  Physical Exam  ***  No results found for any visits on 07/30/20.  Assessment & Plan     ***  No follow-ups on file.      {provider attestation***:1}   Wilhemena Durie, MD  [facility]  Practice [phone] (phone) [phone] (fax)  [place]

## 2020-07-30 ENCOUNTER — Ambulatory Visit: Payer: Self-pay | Admitting: Family Medicine

## 2020-08-04 ENCOUNTER — Other Ambulatory Visit: Payer: Self-pay | Admitting: Family Medicine

## 2020-09-16 ENCOUNTER — Ambulatory Visit: Payer: Self-pay

## 2020-09-16 NOTE — Telephone Encounter (Signed)
Patient called and says since last Wednesday, 09/11/20, his left ankle has been swollen. He says at first he thought it was gout because of the pain, so he took Colchicine. He says it helped a little, but now the pain is worse. He says he finally took a pain pill so he could sleep last night. He says the swelling is around the entire left ankle and you can barely see the bone. He says his calf is also painful and he's concerned about blood clots. He denies SOB, CP or any other symptoms. Appointment scheduled for tomorrow, 09/17/20 at 0820 with Dr. Rosanna Randy, care advice given, patient verbalized understanding.  Reason for Disposition . [1] MODERATE pain (e.g., interferes with normal activities, limping) AND [2] present > 3 days  Answer Assessment - Initial Assessment Questions 1. LOCATION: "Which ankle is swollen?" "Where is the swelling?"     Left ankle 2. ONSET: "When did the swelling start?"     Wednesday, 09/11/20 3. SIZE: "How large is the swelling?"     All the way around, unable to see the ankle bone 4. PAIN: "Is there any pain?" If Yes, ask: "How bad is it?" (Scale 1-10; or mild, moderate, severe)   - NONE (0): no pain.   - MILD (1-3): doesn't interfere with normal activities.    - MODERATE (4-7): interferes with normal activities (e.g., work or school) or awakens from sleep, limping.    - SEVERE (8-10): excruciating pain, unable to do any normal activities, unable to walk.      Yes, 6-7 right now 5. CAUSE: "What do you think caused the ankle swelling?"     I don't know if it's gout or something else, maybe a blood clot 6. OTHER SYMPTOMS: "Do you have any other symptoms?" (e.g., fever, chest pain, difficulty breathing, calf pain)     Calf pain 7. PREGNANCY: "Is there any chance you are pregnant?" "When was your last menstrual period?"     N/A  Protocols used: ANKLE SWELLING-A-AH

## 2020-09-17 ENCOUNTER — Encounter: Payer: Self-pay | Admitting: Family Medicine

## 2020-09-17 ENCOUNTER — Other Ambulatory Visit: Payer: Self-pay

## 2020-09-17 ENCOUNTER — Ambulatory Visit (INDEPENDENT_AMBULATORY_CARE_PROVIDER_SITE_OTHER): Payer: PPO | Admitting: Family Medicine

## 2020-09-17 VITALS — BP 131/93 | HR 71 | Temp 97.9°F | Wt 232.0 lb

## 2020-09-17 DIAGNOSIS — M109 Gout, unspecified: Secondary | ICD-10-CM | POA: Diagnosis not present

## 2020-09-17 DIAGNOSIS — J4521 Mild intermittent asthma with (acute) exacerbation: Secondary | ICD-10-CM | POA: Diagnosis not present

## 2020-09-17 MED ORDER — COLCHICINE 0.6 MG PO TABS: 0.6000 mg | ORAL_TABLET | Freq: Every day | ORAL | 3 refills | Status: DC

## 2020-09-17 MED ORDER — ALBUTEROL SULFATE HFA 108 (90 BASE) MCG/ACT IN AERS: INHALATION_SPRAY | RESPIRATORY_TRACT | 3 refills | Status: DC

## 2020-09-17 NOTE — Progress Notes (Signed)
Established patient visit   Patient: Kenneth Hester   DOB: 05/25/1951   70 y.o. Male  MRN: [id number] Visit Date: 09/17/2020  Today's healthcare provider: Wilhemena Durie, MD   Chief Complaint  Patient presents with  . Ankle Pain    Left ankle; started a few days ago.  Pt is concerned he has gout.     Subjective    Ankle Pain  The incident occurred 5 to 7 days ago. There was no injury mechanism. The pain is present in the left ankle. The quality of the pain is described as aching (Throbbing ). Associated symptoms include tingling. Pertinent negatives include no inability to bear weight, loss of motion, loss of sensation, muscle weakness or numbness. He has tried NSAIDs (Norco, colchicine) for the symptoms.   This is the third episode in the past year.  Occurs in the left lateral ankle and it becomes swollen tender to the touch and warm.  It wakes him up at night under the covers.  Hurts also with weightbearing.  No known trauma. HPI    Ankle Pain     Additional comments: Left ankle; started a few days ago.  Pt is concerned he has gout.         Last edited by Kizzie Furnish, CMA on 09/17/2020  8:24 AM. (History)        Patient Active Problem List   Diagnosis Date Noted  . History of colonic polyps   . 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 Primary localized osteoarthritis of left knee 08/26/2016  . Umbilical hernia without obstruction and without gangrene 05/26/2016  . Encounter for screening colonoscopy 12/03/2015  . Primary localized osteoarthritis of right knee   . Hypertension   . Chest pain 12/05/2013  . Sleep apnea 12/05/2013  . Morbid obesity ([place]) 12/05/2013  . GERD (gastroesophageal reflux disease) 12/05/2013  . Asthma 12/05/2013   Past Medical History:  Diagnosis Date  . Arthritis   . Asthma   . Chest pain 12/05/2013  . 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 Family history of adverse reaction to anesthesia     Patients mother went into cardiac arrest twice during surgery 20 years ago  . GERD (gastroesophageal reflux disease)   . Hyperlipidemia   . Hypertension   . Morbid obesity ([place]) 12/05/2013  . Pneumonia    hx of  . 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 May need to be phlebotomized if hgb goes above 20  . Primary localized osteoarthritis of left knee 08/26/2016  . Primary localized osteoarthritis of right knee   . Renal insufficiency 10/2019   mild case. may stem from use of testosterone.  . Sleep apnea    has not had his cpap for a while. does not use, but still needs it.  . Urgency of urination    Social History   Tobacco Use  . Smoking status: Former Smoker    Types: Cigarettes    Quit date: 1980    Years since quitting: 42.0  . Smokeless tobacco: Never Used  . Tobacco comment: > 40 years ago  Vaping Use  . Vaping Use: Never used  Substance Use Topics  . Alcohol use: No  . Drug use: No   Allergies  Allergen Reactions  . Achromycin [Tetracycline] Rash  . Adhesive [Tape] Rash    Paper tape is okay  . Lisinopril Cough    cough, voice was affected  and had hard time singing     Medications: Outpatient Medications Prior to Visit  Medication Sig  . albuterol (PROVENTIL) (2.5 MG/3ML) 0.083% nebulizer solution Take 3 mLs (2.5 mg total) by nebulization every 4 (four) hours as needed for wheezing or shortness of breath.  . fluticasone (FLOVENT HFA) 110 MCG/ACT inhaler INHALE 2 PUFFS INTO THE LUNGS TWICE A DAY  . hydrochlorothiazide (HYDRODIURIL) 25 MG tablet TAKE 1 TABLET BY MOUTH EVERY DAY  . HYDROcodone-acetaminophen (NORCO/VICODIN) 5-325 MG tablet Take 1 tablet by mouth every 4 (four) hours as needed for moderate pain.  . meclizine (ANTIVERT) 25 MG tablet Take 1 tablet (25 mg total) by mouth 3 (three) times daily as needed for dizziness.  . Multiple Vitamins-Minerals (MULTIVITAMIN WITH MINERALS) tablet Take 1 tablet by mouth daily.   . [DISCONTINUED] albuterol  (PROAIR HFA) 108 (90 Base) MCG/ACT inhaler INHALE 2 SPRAYS EVERY 4 HOURS AS NEEDED (Patient taking differently: Inhale 2 puffs into the lungs every 4 (four) hours as needed for wheezing or shortness of breath. INHALE 2 SPRAYS EVERY 4 HOURS AS NEEDED)   No facility-administered medications prior to visit.    Review of Systems  Constitutional: Negative.   Cardiovascular: Positive for leg swelling (Left ankle). Negative for chest pain and palpitations.  Musculoskeletal: Positive for arthralgias and joint swelling. Negative for back pain, gait problem, myalgias, neck pain and neck stiffness.  Neurological: Positive for tingling. Negative for numbness.       Objective    BP (!) 131/93 (BP Location: Right Arm, Patient Position: Sitting, Cuff Size: Large)   Pulse 71   Temp 97.9 F (36.6 C) (Oral)   Wt 232 lb (105.2 kg)   BMI 33.29 kg/m     Physical Exam Vitals reviewed.  Constitutional:      Appearance: He is well-developed. He is obese.  HENT:     Head: Normocephalic and atraumatic.     Right Ear: External ear normal.     Left Ear: External ear normal.     Nose: Nose normal.  Eyes:     General: No scleral icterus.    Conjunctiva/sclera: Conjunctivae normal.  Neck:     Thyroid: No thyromegaly.  Cardiovascular:     Rate and Rhythm: Normal rate and regular rhythm.     Heart sounds: Normal heart sounds.  Pulmonary:     Effort: Pulmonary effort is normal.     Breath sounds: Normal breath sounds.  Abdominal:     Palpations: Abdomen is soft.     Comments: Small/moderate umbilical hernia present.  Genitourinary:    Comments: Probable right hydrocele. Musculoskeletal:     Right lower leg: No edema.     Left lower leg: No edema.     Comments: Minimal swelling below the left lateral malleolus without tenderness or erythema.  Skin:    General: Skin is warm and dry.  Neurological:     General: No focal deficit present.     Mental Status: He is alert and oriented to person,  place, and time. Mental status is at baseline.  Psychiatric:        Mood and Affect: Mood normal.        Behavior: Behavior normal.        Thought Content: Thought content normal.        Judgment: Judgment normal.       No results found for any visits on 09/17/20.  Assessment & Plan      1. Mild intermittent asthma with acute  exacerbation Refill albuterol for as needed use. - albuterol (PROAIR HFA) 108 (90 Base) MCG/ACT inhaler; INHALE 2 SPRAYS EVERY 4 HOURS AS NEEDED  Dispense: 18 g; Refill: 3  2. Gout of left ankle, unspecified cause, unspecified chronicity Use colchicine as directed.  Check uric acid.  Follow-up in a couple of months and see if he has any episodes consider  allopurinol - Uric acid - colchicine 0.6 MG tablet; Take 1 tablet (0.6 mg total) by mouth daily.  Dispense: 60 tablet; Refill: 3   No follow-ups on file.      I, Wilhemena Durie, MD, have reviewed all documentation for this visit. The documentation on 09/21/20 for the exam, diagnosis, procedures, and orders are all accurate and complete.    Jay Haskew Cranford Mon, MD  [facility] [phone] (phone) [phone] (fax)  [place]

## 2020-09-18 LAB — URIC ACID: Uric Acid: 8.6 mg/dL — ABNORMAL HIGH (ref 3.8–8.4)

## 2020-10-11 DIAGNOSIS — L820 Inflamed seborrheic keratosis: Secondary | ICD-10-CM | POA: Diagnosis not present

## 2020-10-11 DIAGNOSIS — L300 Nummular dermatitis: Secondary | ICD-10-CM | POA: Diagnosis not present

## 2020-10-11 DIAGNOSIS — L570 Actinic keratosis: Secondary | ICD-10-CM | POA: Diagnosis not present

## 2020-10-14 ENCOUNTER — Telehealth: Payer: Self-pay

## 2020-10-14 NOTE — Telephone Encounter (Signed)
Copied from [place] [phone]. Topic: General - Other >> Oct 14, 2020 10:12 AM Keene Breath wrote: Reason for CRM: Patient called to cancel his upcoming Medicare AWV on 10/17/20 because he has a conflict.  Please call patient to reschedule at [phone]

## 2020-10-17 ENCOUNTER — Encounter: Payer: Self-pay | Admitting: Family Medicine

## 2020-10-21 NOTE — Telephone Encounter (Signed)
AWV call scheduled for 10/24/20 @ 9:40 AM.

## 2020-10-23 ENCOUNTER — Encounter: Payer: Self-pay | Admitting: Family Medicine

## 2020-10-23 NOTE — Progress Notes (Deleted)
Annual Wellness Visit     Patient: Kenneth Hester, Male    DOB: 1951/08/12, 70 y.o.   MRN: [id number] Visit Date: 10/23/2020  Today's Provider: Wilhemena Durie, MD   No chief complaint on file.  Patient has AWE on 10/24/2020.   Subjective    Kenneth Hester is a 70 y.o. male who presents today for his Annual Wellness Visit. He reports consuming a {diet types:17450} diet. {Exercise:19826} He generally feels {well/fairly well/poorly:18703}. He reports sleeping {well/fairly well/poorly:18703}. He {does/does not:200015} have additional problems to discuss today.   HPI   {Show patient history (optional):23778::" "}   Medications: Outpatient Medications Prior to Visit  Medication Sig  . albuterol (PROAIR HFA) 108 (90 Base) MCG/ACT inhaler INHALE 2 SPRAYS EVERY 4 HOURS AS NEEDED  . albuterol (PROVENTIL) (2.5 MG/3ML) 0.083% nebulizer solution Take 3 mLs (2.5 mg total) by nebulization every 4 (four) hours as needed for wheezing or shortness of breath.  . colchicine 0.6 MG tablet Take 1 tablet (0.6 mg total) by mouth daily.  . fluticasone (FLOVENT HFA) 110 MCG/ACT inhaler INHALE 2 PUFFS INTO THE LUNGS TWICE A DAY  . hydrochlorothiazide (HYDRODIURIL) 25 MG tablet TAKE 1 TABLET BY MOUTH EVERY DAY  . HYDROcodone-acetaminophen (NORCO/VICODIN) 5-325 MG tablet Take 1 tablet by mouth every 4 (four) hours as needed for moderate pain.  . meclizine (ANTIVERT) 25 MG tablet Take 1 tablet (25 mg total) by mouth 3 (three) times daily as needed for dizziness.  . Multiple Vitamins-Minerals (MULTIVITAMIN WITH MINERALS) tablet Take 1 tablet by mouth daily.    No facility-administered medications prior to visit.    Allergies  Allergen Reactions  . Achromycin [Tetracycline] Rash  . Adhesive [Tape] Rash    Paper tape is okay  . Lisinopril Cough    cough, voice was affected and had hard time singing    Patient Care Team: Jerrol Banana., MD as PCP - General (Family  Medicine) Bary Castilla, [name], MD (General Surgery) Elsie Saas, MD as Consulting Physician (Orthopedic Surgery) Lorelee Cover., MD as Consulting Physician (Ophthalmology)  Review of Systems  All other systems reviewed and are negative.   {Labs  Heme  Chem  Endocrine  Serology  Results Review (optional):23779::" "}  Objective    Vitals: There were no vitals taken for this visit. {Show previous vital signs (optional):23777::" "}  Physical Exam ***  Most recent functional status assessment: In your present state of health, do you have any difficulty performing the following activities: 05/27/2020  Hearing? N  Vision? N  Difficulty concentrating or making decisions? Y  Walking or climbing stairs? N  Dressing or bathing? N  Doing errands, shopping? N  Some recent data might be hidden   Most recent fall risk assessment: Fall Risk  05/27/2020  Falls in the past year? 1  Number falls in past yr: 1  Comment -  Injury with Fall? 0  Follow up Falls evaluation completed    Most recent depression screenings: PHQ 2/9 Scores 05/27/2020 10/16/2019  PHQ - 2 Score 0 0  PHQ- 9 Score 5 -   Most recent cognitive screening: 6CIT Screen 10/14/2018  What Year? 0 points  What month? 0 points  What time? 0 points  Count back from 20 0 points  Months in reverse 0 points  Repeat phrase 0 points  Total Score 0   Most recent Audit-C alcohol use screening Alcohol Use Disorder Test (AUDIT) 05/27/2020  1. How often do you have  a drink containing alcohol? 0  2. How many drinks containing alcohol do you have on a typical day when you are drinking? 0  3. How often do you have six or more drinks on one occasion? 0  AUDIT-C Score 0  Alcohol Brief Interventions/Follow-up AUDIT Score <7 follow-up not indicated   A score of 3 or more in women, and 4 or more in men indicates increased risk for alcohol abuse, EXCEPT if all of the points are from question 1   No results found for any visits on  10/23/20.  Assessment & Plan     Annual wellness visit done today including the all of the following: Reviewed patient's Family Medical History Reviewed and updated list of patient's medical providers Assessment of cognitive impairment was done Assessed patient's functional ability Established a written schedule for health screening [place] Completed and Reviewed  Exercise Activities and Dietary recommendations Goals    . Exercise 150 minutes a week     Recommend starting back exercising for at least 150 minutes a week.     . Increase water intake     Starting 08/27/16, I will increase my water intake to 4-5 glasses a day.    Marland Kitchen LIFESTYLE - DECREASE FALLS RISK     Recommend to remove any items from the home that may cause slips or trips.       Immunization History  Administered Date(s) Administered  . Pneumococcal Conjugate-13 07/27/2014  . Tdap 07/27/2014    Health Maintenance  Topic Date Due  . COVID-19 Vaccine (1) Never done  . PNA vac Low Risk Adult (2 of 2 - PPSV23) 07/28/2015  . INFLUENZA VACCINE  Never done  . COLONOSCOPY (Pts 45-60yrs Insurance coverage will need to be confirmed)  06/22/2022  . TETANUS/TDAP  07/27/2024  . Hepatitis C Screening  Completed  . HPV VACCINES  Aged Out     Discussed health benefits of physical activity, and encouraged him to engage in regular exercise appropriate for his age and condition.    ***  No follow-ups on file.     {provider attestation***:1}   Wilhemena Durie, MD  [facility] [phone] (phone) [phone] (fax)  [place]
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
## 2020-10-27 ENCOUNTER — Other Ambulatory Visit: Payer: Self-pay | Admitting: Family Medicine

## 2020-10-27 NOTE — Telephone Encounter (Signed)
Requested Prescriptions  Pending Prescriptions Disp Refills  . hydrochlorothiazide (HYDRODIURIL) 25 MG tablet [Pharmacy Med Name: HYDROCHLOROTHIAZIDE 25 MG TAB] 90 tablet 0    Sig: TAKE 1 TABLET BY MOUTH EVERY DAY     Cardiovascular: Diuretics - Thiazide Failed - 10/27/2020 12:53 AM      Failed - Last BP in normal range    BP Readings from Last 1 Encounters:  09/17/20 (!) 131/93         Passed - Ca in normal range and within 360 days    Calcium  Date Value Ref Range Status  12/28/2019 9.2 8.9 - 10.3 mg/dL Final   Calcium, Total  Date Value Ref Range Status  05/13/2012 8.6 8.5 - 10.1 mg/dL Final         Passed - Cr in normal range and within 360 days    Creatinine  Date Value Ref Range Status  05/13/2012 1.18 0.60 - 1.30 mg/dL Final   Creatinine, Ser  Date Value Ref Range Status  12/28/2019 1.18 0.61 - 1.24 mg/dL Final         Passed - K in normal range and within 360 days    Potassium  Date Value Ref Range Status  12/28/2019 4.0 3.5 - 5.1 mmol/L Final  05/13/2012 4.3 3.5 - 5.1 mmol/L Final         Passed - Na in normal range and within 360 days    Sodium  Date Value Ref Range Status  12/28/2019 137 135 - 145 mmol/L Final  12/18/2019 139 134 - 144 mmol/L Final  05/13/2012 138 136 - 145 mmol/L Final         Passed - Valid encounter within last 6 months    Recent Outpatient Visits          1 month ago Gout of left ankle, unspecified cause, unspecified chronicity   [facility] Jerrol Banana., MD   5 months ago [place] Jerrol Banana., MD   10 months ago Essential hypertension   [facility] Jerrol Banana., MD   1 year ago Encounter for annual physical exam   [facility] Jerrol Banana., MD   1 year ago Palpitations   [facility] Jerrol Banana., MD      Future Appointments            In 3 weeks Jerrol Banana., MD [facility], [place]   In 2 months Jerrol Banana., MD [facility], [place]

## 2020-11-21 ENCOUNTER — Ambulatory Visit: Payer: Self-pay | Admitting: Family Medicine

## 2020-12-16 ENCOUNTER — Ambulatory Visit: Payer: Self-pay | Admitting: *Deleted

## 2020-12-16 NOTE — Telephone Encounter (Signed)
Episodes of dizziness has been increasing occurring over the last 3 weeks.With  bending and lifting especially, occasionally with sitting to standing. B/P today he has different readings 100/62 and 139/69 HR 79 bpm. Resolves quickly, drinking a lot of water now while trying to lose weight-has lost weight over this time period. Denies vertigo/SOB/CP/HA/vision changes/N/V. Continue water and fluids daily, lie down with legs and feet above the waistline when experiencing dizziness. No availability with pcp. Appointment with [facility] Thursday 8:00am. Had Covid in December.  Reason for Disposition . [1] MODERATE dizziness (e.g., interferes with normal activities) AND [2] has been evaluated by physician for this  Answer Assessment - Initial Assessment Questions 1. DESCRIPTION: "Describe your dizziness."     woozy 2. LIGHTHEADED: "Do you feel lightheaded?" (e.g., somewhat faint, woozy, weak upon standing)     woozy 3. VERTIGO: "Do you feel like either you or the room is spinning or tilting?" (i.e. vertigo)     no 4. SEVERITY: "How bad is it?"  "Do you feel like you are going to faint?" "Can you stand and walk?"   - MILD: Feels slightly dizzy, but walking normally.   - MODERATE: Feels very unsteady when walking, but not falling; interferes with normal activities (e.g., school, work) .   - SEVERE: Unable to walk without falling, or requires assistance to walk without falling; feels like passing out now.      moderate 5. ONSET:  "When did the dizziness begin?"     About 3 weeks ago 6. AGGRAVATING FACTORS: "Does anything make it worse?" (e.g., standing, change in head position)     Bending over, lifting items 7. HEART RATE: "Can you tell me your heart rate?" "How many beats in 15 seconds?"  (Note: not all patients can do this)       79 8. CAUSE: "What do you think is causing the dizziness?"     none 9. RECURRENT SYMPTOM: "Have you had dizziness before?" If Yes, ask: "When was the last time?" "What  happened that time?"    Yes, medication 10. OTHER SYMPTOMS: "Do you have any other symptoms?" (e.g., fever, chest pain, vomiting, diarrhea, bleeding)       None 11. PREGNANCY: "Is there any chance you are pregnant?" "When was your last menstrual period?"       na  Protocols used: DIZZINESS [facility]

## 2020-12-18 ENCOUNTER — Ambulatory Visit: Payer: Self-pay | Admitting: Family Medicine
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
## 2020-12-19 ENCOUNTER — Other Ambulatory Visit: Payer: Self-pay

## 2020-12-19 ENCOUNTER — Ambulatory Visit (INDEPENDENT_AMBULATORY_CARE_PROVIDER_SITE_OTHER): Payer: PPO | Admitting: Family Medicine

## 2020-12-19 ENCOUNTER — Encounter: Payer: Self-pay | Admitting: Family Medicine

## 2020-12-19 VITALS — BP 137/72 | HR 60 | Temp 97.7°F | Resp 16 | Wt 229.0 lb

## 2020-12-19 DIAGNOSIS — I10 Essential (primary) hypertension: Secondary | ICD-10-CM

## 2020-12-19 DIAGNOSIS — J4520 Mild intermittent asthma, uncomplicated: Secondary | ICD-10-CM

## 2020-12-19 DIAGNOSIS — R42 Dizziness and giddiness: Secondary | ICD-10-CM | POA: Diagnosis not present

## 2020-12-19 DIAGNOSIS — G4730 Sleep apnea, unspecified: Secondary | ICD-10-CM

## 2020-12-19 MED ORDER — ALBUTEROL SULFATE HFA 108 (90 BASE) MCG/ACT IN AERS: INHALATION_SPRAY | RESPIRATORY_TRACT | 3 refills | Status: AC

## 2020-12-19 NOTE — Addendum Note (Signed)
Addended by: Margo Common on: 12/19/2020 09:14 AM   Modules accepted: Orders

## 2020-12-20 LAB — COMPREHENSIVE METABOLIC PANEL
ALT: 21 IU/L (ref 0–44)
AST: 21 IU/L (ref 0–40)
Albumin/Globulin Ratio: 2.8 — ABNORMAL HIGH (ref 1.2–2.2)
Albumin: 4.8 g/dL — ABNORMAL HIGH (ref 3.7–4.7)
Alkaline Phosphatase: 60 IU/L (ref 44–121)
BUN/Creatinine Ratio: 19 (ref 10–24)
BUN: 23 mg/dL (ref 8–27)
Bilirubin Total: 0.4 mg/dL (ref 0.0–1.2)
CO2: 25 mmol/L (ref 20–29)
Calcium: 9.8 mg/dL (ref 8.6–10.2)
Chloride: 101 mmol/L (ref 96–106)
Creatinine, Ser: 1.24 mg/dL (ref 0.76–1.27)
Globulin, Total: 1.7 g/dL (ref 1.5–4.5)
Glucose: 112 mg/dL — ABNORMAL HIGH (ref 65–99)
Potassium: 4.7 mmol/L (ref 3.5–5.2)
Sodium: 141 mmol/L (ref 134–144)
Total Protein: 6.5 g/dL (ref 6.0–8.5)
eGFR: 62 mL/min/{1.73_m2} (ref >59)

## 2020-12-20 LAB — CBC WITH DIFFERENTIAL/PLATELET
Basophils Absolute: 0.1 10*3/uL (ref 0.0–0.2)
Basos: 1 %
EOS (ABSOLUTE): 0.2 10*3/uL (ref 0.0–0.4)
Eos: 3 %
Hematocrit: 45.7 % (ref 37.5–51.0)
Hemoglobin: 16.1 g/dL (ref 13.0–17.7)
Immature Grans (Abs): 0 10*3/uL (ref 0.0–0.1)
Immature Granulocytes: 0 %
Lymphocytes Absolute: 2.3 10*3/uL (ref 0.7–3.1)
Lymphs: 34 %
MCH: 33.6 pg — ABNORMAL HIGH (ref 26.6–33.0)
MCHC: 35.2 g/dL (ref 31.5–35.7)
MCV: 95 fL (ref 79–97)
Monocytes Absolute: 0.7 10*3/uL (ref 0.1–0.9)
Monocytes: 10 %
Neutrophils Absolute: 3.5 10*3/uL (ref 1.4–7.0)
Neutrophils: 52 %
Platelets: 162 10*3/uL (ref 150–450)
RBC: 4.79 x10E6/uL (ref 4.14–5.80)
RDW: 12.7 % (ref 11.6–15.4)
WBC: 6.7 10*3/uL (ref 3.4–10.8)

## 2020-12-20 LAB — TSH: TSH: 4.06 u[IU]/mL (ref 0.450–4.500)

## 2020-12-24 ENCOUNTER — Other Ambulatory Visit: Payer: Self-pay | Admitting: Family Medicine

## 2021-01-16 ENCOUNTER — Encounter: Payer: Self-pay | Admitting: Family Medicine

## 2021-02-03 ENCOUNTER — Other Ambulatory Visit: Payer: Self-pay | Admitting: Family Medicine

## 2021-04-29 ENCOUNTER — Other Ambulatory Visit: Payer: Self-pay | Admitting: Family Medicine

## 2021-04-29 NOTE — Telephone Encounter (Signed)
LOV: 12/19/20 Simona Huh Chrismon) Next OV: none scheduled.  Last refill: 02/03/21 (Dr. Rosanna Randy)   Please review. Thanks!

## 2021-07-30 ENCOUNTER — Other Ambulatory Visit: Payer: Self-pay

## 2021-07-30 ENCOUNTER — Emergency Department
Admission: EM | Admit: 2021-07-30 | Discharge: 2021-07-30 | Disposition: A | Discharge status: Home / Self Care | Payer: PPO | Attending: Emergency Medicine | Admitting: Emergency Medicine

## 2021-07-30 DIAGNOSIS — R001 Bradycardia, unspecified: Secondary | ICD-10-CM | POA: Diagnosis not present

## 2021-07-30 DIAGNOSIS — R42 Dizziness and giddiness: Secondary | ICD-10-CM | POA: Insufficient documentation

## 2021-07-30 DIAGNOSIS — Z5321 Procedure and treatment not carried out due to patient leaving prior to being seen by health care provider: Secondary | ICD-10-CM | POA: Diagnosis not present

## 2021-07-30 DIAGNOSIS — I10 Essential (primary) hypertension: Secondary | ICD-10-CM | POA: Diagnosis not present

## 2021-07-30 DIAGNOSIS — R112 Nausea with vomiting, unspecified: Secondary | ICD-10-CM | POA: Diagnosis not present

## 2021-07-30 DIAGNOSIS — R0902 Hypoxemia: Secondary | ICD-10-CM | POA: Diagnosis not present

## 2021-07-30 LAB — CBC
HCT: 41.7 % (ref 39.0–52.0)
Hemoglobin: 14.6 g/dL (ref 13.0–17.0)
MCH: 33 pg (ref 26.0–34.0)
MCHC: 35 g/dL (ref 30.0–36.0)
MCV: 94.3 fL (ref 80.0–100.0)
Platelets: 153 10*3/uL (ref 150–400)
RBC: 4.42 MIL/uL (ref 4.22–5.81)
RDW: 13.1 % (ref 11.5–15.5)
WBC: 7.1 10*3/uL (ref 4.0–10.5)
nRBC: 0 % (ref 0.0–0.2)

## 2021-07-30 LAB — BASIC METABOLIC PANEL
Anion gap: 10 (ref 5–15)
BUN: 21 mg/dL (ref 8–23)
CO2: 25 mmol/L (ref 22–32)
Calcium: 9 mg/dL (ref 8.9–10.3)
Chloride: 103 mmol/L (ref 98–111)
Creatinine, Ser: 1.04 mg/dL (ref 0.61–1.24)
GFR, Estimated: >60 mL/min (ref >60)
Glucose, Bld: 129 mg/dL — ABNORMAL HIGH (ref 70–99)
Potassium: 3.7 mmol/L (ref 3.5–5.1)
Sodium: 138 mmol/L (ref 135–145)

## 2021-07-30 NOTE — ED Triage Notes (Signed)
Pt comes with c/o N/V and dizziness. Pt states this started today after eating. Pt given 4 of zofran IM and 500 cc of fluids.  BP-179/85 HR-70 O2-97% RA CBG-129

## 2021-08-04 DIAGNOSIS — L109 Pemphigus, unspecified: Secondary | ICD-10-CM | POA: Diagnosis not present

## 2021-08-04 DIAGNOSIS — R42 Dizziness and giddiness: Secondary | ICD-10-CM | POA: Diagnosis not present

## 2021-08-06 DIAGNOSIS — R42 Dizziness and giddiness: Secondary | ICD-10-CM | POA: Diagnosis not present

## 2021-08-06 DIAGNOSIS — I10 Essential (primary) hypertension: Secondary | ICD-10-CM | POA: Diagnosis not present

## 2021-09-24 DIAGNOSIS — E291 Testicular hypofunction: Secondary | ICD-10-CM | POA: Diagnosis not present

## 2021-09-24 DIAGNOSIS — R861 Abnormal level of hormones in specimens from male genital organs: Secondary | ICD-10-CM | POA: Diagnosis not present

## 2021-10-10 DIAGNOSIS — Z0000 Encounter for general adult medical examination without abnormal findings: Secondary | ICD-10-CM | POA: Diagnosis not present

## 2021-10-10 DIAGNOSIS — E785 Hyperlipidemia, unspecified: Secondary | ICD-10-CM | POA: Diagnosis not present

## 2021-10-10 DIAGNOSIS — Z125 Encounter for screening for malignant neoplasm of prostate: Secondary | ICD-10-CM | POA: Diagnosis not present

## 2021-10-10 DIAGNOSIS — R739 Hyperglycemia, unspecified: Secondary | ICD-10-CM | POA: Diagnosis not present

## 2021-10-20 DIAGNOSIS — R490 Dysphonia: Secondary | ICD-10-CM | POA: Diagnosis not present

## 2021-10-20 DIAGNOSIS — H6123 Impacted cerumen, bilateral: Secondary | ICD-10-CM | POA: Diagnosis not present

## 2021-10-20 DIAGNOSIS — K219 Gastro-esophageal reflux disease without esophagitis: Secondary | ICD-10-CM | POA: Diagnosis not present

## 2021-10-27 ENCOUNTER — Other Ambulatory Visit: Payer: Self-pay | Admitting: Family Medicine

## 2021-10-28 DIAGNOSIS — G4733 Obstructive sleep apnea (adult) (pediatric): Secondary | ICD-10-CM | POA: Diagnosis not present

## 2021-11-13 DIAGNOSIS — J4540 Moderate persistent asthma, uncomplicated: Secondary | ICD-10-CM | POA: Diagnosis not present

## 2021-12-02 DIAGNOSIS — G4733 Obstructive sleep apnea (adult) (pediatric): Secondary | ICD-10-CM | POA: Diagnosis not present

## 2021-12-17 DIAGNOSIS — R490 Dysphonia: Secondary | ICD-10-CM | POA: Diagnosis not present

## 2021-12-17 DIAGNOSIS — K219 Gastro-esophageal reflux disease without esophagitis: Secondary | ICD-10-CM | POA: Diagnosis not present

## 2022-01-13 ENCOUNTER — Telehealth: Payer: Self-pay | Admitting: Family Medicine

## 2022-01-13 NOTE — Telephone Encounter (Signed)
Copied from [place]. Topic: Medicare AWV >> Jan 13, 2022 10:48 AM Cher Nakai R wrote: Reason for CRM:  Left message for patient to call back and schedule Medicare Annual Wellness Visit (AWV) in office.   If unable to come into the office for AWV,  please offer to do virtually or by telephone.  Last AWV: 10/16/2019   Please schedule at anytime with [facility] Health Advisor.  30 minute appointment for Virtual or phone 45 minute appointment for in office or Initial virtual/phone  Any questions, please contact me at [phone]

## 2022-02-10 ENCOUNTER — Other Ambulatory Visit: Payer: Self-pay | Admitting: Family Medicine

## 2022-02-10 DIAGNOSIS — M109 Gout, unspecified: Secondary | ICD-10-CM

## 2022-02-10 NOTE — Telephone Encounter (Signed)
Called pt to make follow up appointment. Pt states he is changing provider's.

## 2022-02-10 NOTE — Telephone Encounter (Signed)
Requested medications are due for refill today.  yes  Requested medications are on the active medications list.  yes  Last refill. 09/17/2020 #60 3 refills  Future visit scheduled.   no  Notes to clinic.  Called pt. Pt states he is changing providers.    Requested Prescriptions  Pending Prescriptions Disp Refills   colchicine 0.6 MG tablet [Pharmacy Med Name: COLCHICINE 0.6 MG TABLET] 90 tablet 2    Sig: TAKE 1 TABLET BY [place] DAY     Endocrinology:  Gout Agents - colchicine Failed - 02/10/2022 12:27 PM      Failed - ALT in normal range and within 360 days    ALT  Date Value Ref Range Status  12/19/2020 21 0 - 44 IU/L Final   SGPT (ALT)  Date Value Ref Range Status  05/13/2012 70 12 - 78 U/L Final         Failed - AST in normal range and within 360 days    AST  Date Value Ref Range Status  12/19/2020 21 0 - 40 IU/L Final   SGOT(AST)  Date Value Ref Range Status  05/13/2012 52 (H) 15 - 37 Unit/L Final         Failed - Valid encounter within last 12 months    Recent Outpatient Visits           1 year ago [place], Vickki Muff, PA-C   1 year ago Gout of left ankle, unspecified cause, unspecified chronicity   [facility] Jerrol Banana., MD   1 year ago Vertigo   [facility] Jerrol Banana., MD   2 years ago Essential hypertension   [facility] Jerrol Banana., MD   2 years ago Encounter for annual physical exam   [facility] Jerrol Banana., MD              Failed - CBC within normal limits and completed in the last 12 months    WBC  Date Value Ref Range Status  07/30/2021 7.1 4.0 - 10.5 K/uL Final   RBC  Date Value Ref Range Status  07/30/2021 4.42 4.22 - 5.81 MIL/uL Final   Hemoglobin  Date Value Ref Range Status  07/30/2021 14.6 13.0 - 17.0 g/dL Final  12/19/2020 16.1 13.0 - 17.7 g/dL Final   HCT  Date Value Ref Range  Status  07/30/2021 41.7 39.0 - 52.0 % Final   Hematocrit  Date Value Ref Range Status  12/19/2020 45.7 37.5 - 51.0 % Final   MCHC  Date Value Ref Range Status  07/30/2021 35.0 30.0 - 36.0 g/dL Final   [facility]  Date Value Ref Range Status  07/30/2021 33.0 26.0 - 34.0 pg Final   MCV  Date Value Ref Range Status  07/30/2021 94.3 80.0 - 100.0 fL Final  12/19/2020 95 79 - 97 fL Final  05/13/2012 97 80 - 100 fL Final   No results found for: "PLTCOUNTKUC", "LABPLAT", "POCPLA" RDW  Date Value Ref Range Status  07/30/2021 13.1 11.5 - 15.5 % Final  12/19/2020 12.7 11.6 - 15.4 % Final  05/13/2012 12.7 11.5 - 14.5 % Final         Passed - Cr in normal range and within 360 days    Creatinine  Date Value Ref Range Status  05/13/2012 1.18 0.60 - 1.30 mg/dL Final   Creatinine, Ser  Date Value Ref Range Status  07/30/2021 1.04 0.61 - 1.24 mg/dL Final

## 2022-02-23 ENCOUNTER — Telehealth: Payer: Self-pay | Admitting: Family Medicine

## 2022-02-23 NOTE — Telephone Encounter (Signed)
Copied from [place] [id number]. Topic: Medicare AWV >> Feb 23, 2022 12:28 PM Jae Dire wrote: Reason for CRM:  Left message for patient to call back and schedule Medicare Annual Wellness Visit (AWV) in office.   If unable to come into the office for AWV,  please offer to do virtually or by telephone.  Last AWV: 10/16/2019  Please schedule at anytime with [facility] Health Advisor.  30 minute appointment for Virtual or phone 45 minute appointment for in office or Initial virtual/phone  Any questions, please contact me at [phone]

## 2022-03-16 DIAGNOSIS — G4733 Obstructive sleep apnea (adult) (pediatric): Secondary | ICD-10-CM | POA: Diagnosis not present

## 2022-03-30 ENCOUNTER — Telehealth: Payer: Self-pay | Admitting: Family Medicine

## 2022-03-30 NOTE — Telephone Encounter (Signed)
Spoke with patient to schedule AWVS.  He explained that he is trying to find another provider and will not be coming back to [facility]

## 2022-04-25 ENCOUNTER — Other Ambulatory Visit: Payer: Self-pay | Admitting: Family Medicine

## 2022-05-12 ENCOUNTER — Other Ambulatory Visit: Payer: Self-pay | Admitting: Family Medicine

## 2022-05-12 DIAGNOSIS — H25813 Combined forms of age-related cataract, bilateral: Secondary | ICD-10-CM | POA: Diagnosis not present

## 2022-05-27 ENCOUNTER — Ambulatory Visit: Payer: Self-pay

## 2022-05-27 NOTE — Patient Instructions (Signed)
Visit Information  Thank you for taking time to visit with me today. Please don't hesitate to contact me if I can be of assistance to you.   Following are the goals we discussed today:   Goals Addressed             This Visit's Progress    COMPLETED: RNCM: The patient asking how to get medical records       Care Coordination Interventions: Evaluation of current treatment plan related to chronic conditions and patient's adherence to plan as established by provider Advised patient to to call [facility] and ask them to send his medical records to his new provider Dr. Lenise Arena at the [facility]. The patient states he has established primary care there and he would need his records sent there. Encouraged the patient to call the office and he may need to sign a release of information paper to have records sent to Dr. Jimmye Norman Assessed social determinant of health barriers The patient ask about the nature of the call. The patient educated about the care coordination program and the reason for the call. The patient states he is no longer a patient at [facility] and has a new provider, Dr. Lenise Arena at the [facility].               Please call the care guide team at [phone] if you need to schedule an appointment.   If you are experiencing a Mental Health or [place] or need someone to talk to, please call the Suicide and Crisis Lifeline: 988 call the [place] National Suicide Prevention Lifeline: 514-751-4929 or TTY: (445) 427-6271 TTY (732) 764-3314) to talk to a trained counselor call 1-800-273-TALK (toll free, 24 hour hotline)  Patient verbalizes understanding of instructions and care plan provided today and agrees to view in [place]. Active MyChart status and patient understanding of how to access instructions and care plan via MyChart confirmed with patient.     No further follow up required: the patient has established with a new pcp. Noreene Larsson RN, MSN,  [facility]  Mobile: [phone]

## 2022-05-27 NOTE — Patient Outreach (Signed)
  Care Coordination   Initial Visit Note   05/27/2022 Name: Kenneth Hester MRN: [id number] DOB: 04-Jun-1951  Kenneth Hester is a 71 y.o. year old male who sees Kenneth Hester, Kenneth Dell, MD for primary care. I spoke with  Kenneth Hester by phone today.  What matters to the patients health and wellness today?  Getting his records moved from [facility] to his new provider at [place]             This Visit's Progress    COMPLETED: RNCM: The patient asking how to get medical records       Care Coordination Interventions: Evaluation of current treatment plan related to chronic conditions and patient's adherence to plan as established by provider Advised patient to to call [facility] and ask them to send his medical records to his new provider Dr. Lenise Arena at the [facility]. The patient states he has established primary care there and he would need his records sent there. Encouraged the patient to call the office and he may need to sign a release of information paper to have records sent to Dr. Jimmye Hester Assessed social determinant of health barriers The patient ask about the nature of the call. The patient educated about the care coordination program and the reason for the call. The patient states he is no longer a patient at [facility] and has a new provider, Dr. Lenise Arena at the [facility].             SDOH assessments and interventions completed:  Yes  SDOH Interventions Today    Flowsheet Row Most Recent Value  SDOH Interventions   Food Insecurity Interventions Intervention Not Indicated  Housing Interventions Intervention Not Indicated  Utilities Interventions Intervention Not Indicated        Care Coordination Interventions Activated:  Yes  Care Coordination Interventions:  Yes, provided   Follow up plan: No further intervention required.   Encounter Outcome:  Pt. Visit Completed   Noreene Larsson RN, MSN, [place]  Mobile: [phone]

## 2022-07-21 DIAGNOSIS — H25013 Cortical age-related cataract, bilateral: Secondary | ICD-10-CM | POA: Diagnosis not present

## 2022-07-21 DIAGNOSIS — H2512 Age-related nuclear cataract, left eye: Secondary | ICD-10-CM | POA: Diagnosis not present

## 2022-07-21 DIAGNOSIS — H02831 Dermatochalasis of right upper eyelid: Secondary | ICD-10-CM | POA: Diagnosis not present

## 2022-07-21 DIAGNOSIS — H2513 Age-related nuclear cataract, bilateral: Secondary | ICD-10-CM | POA: Diagnosis not present

## 2022-07-21 DIAGNOSIS — H35372 Puckering of macula, left eye: Secondary | ICD-10-CM | POA: Diagnosis not present

## 2022-07-21 DIAGNOSIS — H25043 Posterior subcapsular polar age-related cataract, bilateral: Secondary | ICD-10-CM | POA: Diagnosis not present

## 2022-09-16 DIAGNOSIS — H25013 Cortical age-related cataract, bilateral: Secondary | ICD-10-CM | POA: Diagnosis not present

## 2022-09-16 DIAGNOSIS — H2511 Age-related nuclear cataract, right eye: Secondary | ICD-10-CM | POA: Diagnosis not present

## 2022-09-16 DIAGNOSIS — H2513 Age-related nuclear cataract, bilateral: Secondary | ICD-10-CM | POA: Diagnosis not present

## 2022-09-16 DIAGNOSIS — H25043 Posterior subcapsular polar age-related cataract, bilateral: Secondary | ICD-10-CM | POA: Diagnosis not present

## 2022-09-16 DIAGNOSIS — H18413 Arcus senilis, bilateral: Secondary | ICD-10-CM | POA: Diagnosis not present
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
## 2022-10-05 ENCOUNTER — Encounter (INDEPENDENT_AMBULATORY_CARE_PROVIDER_SITE_OTHER): Payer: Self-pay | Admitting: Ophthalmology

## 2022-10-05 ENCOUNTER — Ambulatory Visit (INDEPENDENT_AMBULATORY_CARE_PROVIDER_SITE_OTHER): Payer: PPO | Admitting: Ophthalmology

## 2022-10-05 DIAGNOSIS — H3581 Retinal edema: Secondary | ICD-10-CM

## 2022-10-05 DIAGNOSIS — I10 Essential (primary) hypertension: Secondary | ICD-10-CM | POA: Diagnosis not present

## 2022-10-05 DIAGNOSIS — H35372 Puckering of macula, left eye: Secondary | ICD-10-CM | POA: Diagnosis not present

## 2022-10-05 DIAGNOSIS — H35033 Hypertensive retinopathy, bilateral: Secondary | ICD-10-CM | POA: Diagnosis not present

## 2022-10-05 DIAGNOSIS — H25813 Combined forms of age-related cataract, bilateral: Secondary | ICD-10-CM

## 2022-10-05 DIAGNOSIS — H35352 Cystoid macular degeneration, left eye: Secondary | ICD-10-CM

## 2022-10-05 MED ORDER — PREDNISOLONE ACETATE 1 % OP SUSP: 1.0000 [drp] | Freq: Four times a day (QID) | OPHTHALMIC | 0 refills | Status: DC

## 2022-10-05 MED ORDER — PROLENSA 0.07 % OP SOLN: 1.0000 [drp] | Freq: Four times a day (QID) | OPHTHALMIC | 6 refills | Status: DC

## 2022-10-13 DIAGNOSIS — H2511 Age-related nuclear cataract, right eye: Secondary | ICD-10-CM | POA: Diagnosis not present

## 2022-10-13 DIAGNOSIS — H269 Unspecified cataract: Secondary | ICD-10-CM | POA: Diagnosis not present

## 2022-10-13 DIAGNOSIS — H2512 Age-related nuclear cataract, left eye: Secondary | ICD-10-CM | POA: Diagnosis not present
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
## 2022-10-19 ENCOUNTER — Ambulatory Visit (INDEPENDENT_AMBULATORY_CARE_PROVIDER_SITE_OTHER): Payer: PPO | Admitting: Ophthalmology

## 2022-10-19 ENCOUNTER — Encounter (INDEPENDENT_AMBULATORY_CARE_PROVIDER_SITE_OTHER): Payer: Self-pay | Admitting: Ophthalmology

## 2022-10-19 DIAGNOSIS — H25812 Combined forms of age-related cataract, left eye: Secondary | ICD-10-CM

## 2022-10-19 DIAGNOSIS — H35033 Hypertensive retinopathy, bilateral: Secondary | ICD-10-CM | POA: Diagnosis not present

## 2022-10-19 DIAGNOSIS — H25813 Combined forms of age-related cataract, bilateral: Secondary | ICD-10-CM

## 2022-10-19 DIAGNOSIS — H35372 Puckering of macula, left eye: Secondary | ICD-10-CM

## 2022-10-19 DIAGNOSIS — H35352 Cystoid macular degeneration, left eye: Secondary | ICD-10-CM

## 2022-10-19 DIAGNOSIS — I10 Essential (primary) hypertension: Secondary | ICD-10-CM | POA: Diagnosis not present

## 2022-10-19 DIAGNOSIS — Z961 Presence of intraocular lens: Secondary | ICD-10-CM | POA: Diagnosis not present

## 2022-10-19 MED ORDER — TRIAMCINOLONE ACETONIDE 40 MG/ML IJ SUSP FOR KALEIDOSCOPE
40.0000 mg | INTRAMUSCULAR | Status: AC | PRN
  Administered 2022-10-19: 40 mg

## 2022-10-21 ENCOUNTER — Other Ambulatory Visit (INDEPENDENT_AMBULATORY_CARE_PROVIDER_SITE_OTHER): Payer: Self-pay

## 2022-10-21 MED ORDER — PREDNISOLONE ACETATE 1 % OP SUSP: 1.0000 [drp] | Freq: Four times a day (QID) | OPHTHALMIC | 0 refills | Status: DC

## 2022-10-21 MED ORDER — BROMFENAC SODIUM 0.07 % OP SOLN: 1.0000 [drp] | Freq: Four times a day (QID) | OPHTHALMIC | 6 refills | Status: DC
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
## 2022-11-16 ENCOUNTER — Encounter (INDEPENDENT_AMBULATORY_CARE_PROVIDER_SITE_OTHER): Payer: Self-pay | Admitting: Ophthalmology

## 2022-11-16 ENCOUNTER — Ambulatory Visit (INDEPENDENT_AMBULATORY_CARE_PROVIDER_SITE_OTHER): Payer: PPO | Admitting: Ophthalmology

## 2022-11-16 DIAGNOSIS — H35353 Cystoid macular degeneration, bilateral: Secondary | ICD-10-CM | POA: Diagnosis not present

## 2022-11-16 DIAGNOSIS — H35372 Puckering of macula, left eye: Secondary | ICD-10-CM | POA: Diagnosis not present

## 2022-11-16 DIAGNOSIS — H25812 Combined forms of age-related cataract, left eye: Secondary | ICD-10-CM | POA: Diagnosis not present

## 2022-11-16 DIAGNOSIS — H35033 Hypertensive retinopathy, bilateral: Secondary | ICD-10-CM

## 2022-11-16 DIAGNOSIS — H35352 Cystoid macular degeneration, left eye: Secondary | ICD-10-CM

## 2022-11-16 DIAGNOSIS — I10 Essential (primary) hypertension: Secondary | ICD-10-CM | POA: Diagnosis not present

## 2022-11-16 DIAGNOSIS — Z961 Presence of intraocular lens: Secondary | ICD-10-CM

## 2022-11-16 MED ORDER — PREDNISOLONE ACETATE 1 % OP SUSP: 1.0000 [drp] | Freq: Four times a day (QID) | OPHTHALMIC | 2 refills | Status: DC

## 2022-11-16 MED ORDER — BROMFENAC SODIUM 0.07 % OP SOLN: 1.0000 [drp] | Freq: Four times a day (QID) | OPHTHALMIC | 2 refills | Status: DC
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
## 2022-12-14 ENCOUNTER — Encounter (INDEPENDENT_AMBULATORY_CARE_PROVIDER_SITE_OTHER): Payer: Self-pay | Admitting: Ophthalmology

## 2022-12-14 ENCOUNTER — Ambulatory Visit (INDEPENDENT_AMBULATORY_CARE_PROVIDER_SITE_OTHER): Payer: PPO | Admitting: Ophthalmology

## 2022-12-14 DIAGNOSIS — H35033 Hypertensive retinopathy, bilateral: Secondary | ICD-10-CM

## 2022-12-14 DIAGNOSIS — H35372 Puckering of macula, left eye: Secondary | ICD-10-CM

## 2022-12-14 DIAGNOSIS — H25812 Combined forms of age-related cataract, left eye: Secondary | ICD-10-CM

## 2022-12-14 DIAGNOSIS — I10 Essential (primary) hypertension: Secondary | ICD-10-CM

## 2022-12-14 DIAGNOSIS — H35353 Cystoid macular degeneration, bilateral: Secondary | ICD-10-CM

## 2022-12-14 DIAGNOSIS — Z961 Presence of intraocular lens: Secondary | ICD-10-CM | POA: Diagnosis not present

## 2022-12-14 MED ORDER — PREDNISOLONE ACETATE 1 % OP SUSP: 1.0000 [drp] | Freq: Four times a day (QID) | OPHTHALMIC | 2 refills | Status: AC

## 2022-12-14 MED ORDER — BROMFENAC SODIUM 0.07 % OP SOLN: 1.0000 [drp] | Freq: Four times a day (QID) | OPHTHALMIC | 2 refills | Status: AC

## 2022-12-29 DIAGNOSIS — H2512 Age-related nuclear cataract, left eye: Secondary | ICD-10-CM | POA: Diagnosis not present

## 2022-12-29 DIAGNOSIS — H269 Unspecified cataract: Secondary | ICD-10-CM | POA: Diagnosis not present

## 2023-01-04 DIAGNOSIS — L300 Nummular dermatitis: Secondary | ICD-10-CM | POA: Diagnosis not present

## 2023-01-04 DIAGNOSIS — L814 Other melanin hyperpigmentation: Secondary | ICD-10-CM | POA: Diagnosis not present

## 2023-01-04 DIAGNOSIS — L821 Other seborrheic keratosis: Secondary | ICD-10-CM | POA: Diagnosis not present

## 2023-01-04 DIAGNOSIS — L570 Actinic keratosis: Secondary | ICD-10-CM | POA: Diagnosis not present

## 2023-01-04 DIAGNOSIS — D0439 Carcinoma in situ of skin of other parts of face: Secondary | ICD-10-CM | POA: Diagnosis not present

## 2023-01-04 DIAGNOSIS — L820 Inflamed seborrheic keratosis: Secondary | ICD-10-CM | POA: Diagnosis not present

## 2023-01-15 DIAGNOSIS — H43813 Vitreous degeneration, bilateral: Secondary | ICD-10-CM | POA: Diagnosis not present

## 2023-01-15 DIAGNOSIS — H35372 Puckering of macula, left eye: Secondary | ICD-10-CM | POA: Diagnosis not present

## 2023-01-15 DIAGNOSIS — H3581 Retinal edema: Secondary | ICD-10-CM | POA: Diagnosis not present

## 2023-01-15 DIAGNOSIS — H43393 Other vitreous opacities, bilateral: Secondary | ICD-10-CM | POA: Diagnosis not present

## 2023-01-25 ENCOUNTER — Encounter (INDEPENDENT_AMBULATORY_CARE_PROVIDER_SITE_OTHER): Payer: PPO | Admitting: Ophthalmology

## 2023-02-04 DIAGNOSIS — H3581 Retinal edema: Secondary | ICD-10-CM | POA: Diagnosis not present

## 2023-02-04 DIAGNOSIS — H35372 Puckering of macula, left eye: Secondary | ICD-10-CM | POA: Diagnosis not present

## 2023-02-12 DIAGNOSIS — H35372 Puckering of macula, left eye: Secondary | ICD-10-CM | POA: Diagnosis not present

## 2023-02-12 DIAGNOSIS — H3581 Retinal edema: Secondary | ICD-10-CM | POA: Diagnosis not present

## 2023-02-12 DIAGNOSIS — Z9889 Other specified postprocedural states: Secondary | ICD-10-CM | POA: Diagnosis not present

## 2023-06-03 DIAGNOSIS — H3581 Retinal edema: Secondary | ICD-10-CM | POA: Diagnosis not present

## 2023-06-08 DIAGNOSIS — H43811 Vitreous degeneration, right eye: Secondary | ICD-10-CM | POA: Diagnosis not present
